# Patient Record
Sex: Female | Born: 1990 | Race: White | Hispanic: No | Marital: Married | Smoking: Never smoker
Health system: Southern US, Community
[De-identification: ages and names within clinical notes are randomized; demographics above are authoritative.]

## PROBLEM LIST (undated history)

## (undated) DIAGNOSIS — O009 Unspecified ectopic pregnancy without intrauterine pregnancy: Secondary | ICD-10-CM

## (undated) DIAGNOSIS — G43909 Migraine, unspecified, not intractable, without status migrainosus: Secondary | ICD-10-CM

## (undated) DIAGNOSIS — F32A Depression, unspecified: Secondary | ICD-10-CM

## (undated) DIAGNOSIS — E162 Hypoglycemia, unspecified: Secondary | ICD-10-CM

## (undated) DIAGNOSIS — F329 Major depressive disorder, single episode, unspecified: Secondary | ICD-10-CM

## (undated) DIAGNOSIS — O365930 Maternal care for other known or suspected poor fetal growth, third trimester, not applicable or unspecified: Secondary | ICD-10-CM

## (undated) DIAGNOSIS — Z3A40 40 weeks gestation of pregnancy: Secondary | ICD-10-CM

## (undated) DIAGNOSIS — F419 Anxiety disorder, unspecified: Secondary | ICD-10-CM

## (undated) DIAGNOSIS — F319 Bipolar disorder, unspecified: Secondary | ICD-10-CM

## (undated) HISTORY — DX: Hypoglycemia, unspecified: E16.2

## (undated) HISTORY — DX: Maternal care for other known or suspected poor fetal growth, third trimester, not applicable or unspecified: O36.5930

## (undated) HISTORY — DX: 40 weeks gestation of pregnancy: Z3A.40

## (undated) HISTORY — DX: Unspecified ectopic pregnancy without intrauterine pregnancy: O00.90

## (undated) HISTORY — PX: WISDOM TOOTH EXTRACTION: SHX21

## (undated) HISTORY — PX: OTHER SURGICAL HISTORY: SHX169

## (undated) HISTORY — DX: Migraine, unspecified, not intractable, without status migrainosus: G43.909

## (undated) HISTORY — PX: TONSILLECTOMY: SUR1361

---
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
## 2012-07-03 ENCOUNTER — Encounter: Payer: Self-pay | Admitting: Family Medicine

## 2012-07-03 ENCOUNTER — Ambulatory Visit (INDEPENDENT_AMBULATORY_CARE_PROVIDER_SITE_OTHER): Payer: 59 | Admitting: Family Medicine

## 2012-07-03 VITALS — BP 131/101 | HR 85 | Ht 69.75 in | Wt 230.0 lb

## 2012-07-03 DIAGNOSIS — Z1151 Encounter for screening for human papillomavirus (HPV): Secondary | ICD-10-CM

## 2012-07-03 DIAGNOSIS — IMO0001 Reserved for inherently not codable concepts without codable children: Secondary | ICD-10-CM | POA: Insufficient documentation

## 2012-07-03 DIAGNOSIS — Z309 Encounter for contraceptive management, unspecified: Secondary | ICD-10-CM

## 2012-07-03 DIAGNOSIS — Z124 Encounter for screening for malignant neoplasm of cervix: Secondary | ICD-10-CM

## 2012-07-03 DIAGNOSIS — Z01419 Encounter for gynecological examination (general) (routine) without abnormal findings: Secondary | ICD-10-CM

## 2012-07-03 DIAGNOSIS — Z113 Encounter for screening for infections with a predominantly sexual mode of transmission: Secondary | ICD-10-CM

## 2012-07-03 DIAGNOSIS — G43109 Migraine with aura, not intractable, without status migrainosus: Secondary | ICD-10-CM

## 2012-07-03 MED ORDER — MISOPROSTOL 200 MCG PO TABS: 200.0000 ug | ORAL_TABLET | Freq: Two times a day (BID) | ORAL | Status: DC

## 2012-07-03 NOTE — Progress Notes (Signed)
Patient ID: Emily Payne, female   DOB: 1991-02-15, 21 y.o.   MRN: [id number] New patient here for yearly exam and needs to discuss birth control.

## 2012-07-03 NOTE — Progress Notes (Signed)
  Subjective:     Emily Payne is a 21 y.o. female and is here for a comprehensive physical exam. The patient reports problems - migraine headache with aura and anxiety, including panic attacks.  Also, wants to change her birth control, has been on Nuvaring.Marland Kitchen  History   Social History  . Marital Status: Married    Spouse Name: N/A    Number of Children: N/A  . Years of Education: N/A   Occupational History  . Not on file.   Social History Main Topics  . Smoking status: Never Smoker   . Smokeless tobacco: Never Used  . Alcohol Use: No  . Drug Use: No  . Sexually Active: Yes -- Female partner(s)   Other Topics Concern  . Not on file   Social History Narrative  . No narrative on file   No health maintenance topics applied.  The following portions of the patient's history were reviewed and updated as appropriate: allergies, current medications, past family history, past medical history, past social history, past surgical history and problem list.  Review of Systems Pertinent items are noted in HPI.   Objective:    BP 131/101  Pulse 85  Ht 5' 9.75" (1.772 m)  Wt 230 lb (104.327 kg)  BMI 33.24 kg/m2  LMP 07/03/2012 General appearance: alert, cooperative and appears stated age Neck: no adenopathy, supple, symmetrical, trachea midline and thyroid not enlarged, symmetric, no tenderness/mass/nodules Lungs: clear to auscultation bilaterally Breasts: normal appearance, no masses or tenderness Heart: regular rate and rhythm, S1, S2 normal, no murmur, click, rub or gallop Abdomen: soft, non-tender; bowel sounds normal; no masses,  no organomegaly Pelvic: cervix normal in appearance, external genitalia normal, no adnexal masses or tenderness, no cervical motion tenderness, uterus normal size, shape, and consistency, vagina normal without discharge and uterus is retroverted Extremities: extremities normal, atraumatic, no cyanosis or edema Pulses: 2+ and symmetric Skin: Skin color,  texture, turgor normal. No rashes or lesions Lymph nodes: Cervical, supraclavicular, and axillary nodes normal. Neurologic: Grossly normal    Assessment:    Healthy female exam. Migraine with aura, anxiety/panic attacks. Desire to change contraception to LARC.     Plan:  Return for skyla--after cytotec. Pap smear today To see Jannifer Rodney, NP tomorrow to help with anxiety and headache.   See After Visit Summary for Counseling Recommendations

## 2012-07-03 NOTE — Patient Instructions (Addendum)
Anxiety and Panic Attacks Your caregiver has informed you that you are having an anxiety or panic attack. There may be many forms of this. Most of the time these attacks come suddenly and without warning. They come at any time of day, including periods of sleep, and at any time of life. They may be strong and unexplained. Although panic attacks are very scary, they are physically harmless. Sometimes the cause of your anxiety is not known. Anxiety is a protective mechanism of the body in its fight or flight mechanism. Most of these perceived danger situations are actually nonphysical situations (such as anxiety over losing a job). CAUSES  The causes of an anxiety or panic attack are many. Panic attacks may occur in otherwise healthy people given a certain set of circumstances. There may be a genetic cause for panic attacks. Some medications may also have anxiety as a side effect. SYMPTOMS  Some of the most common feelings are:  Intense terror.  Dizziness, feeling faint.  Hot and cold flashes.  Fear of going crazy.  Feelings that nothing is real.  Sweating.  Shaking.  Chest pain or a fast heartbeat (palpitations).  Smothering, choking sensations.  Feelings of impending doom and that death is near.  Tingling of extremities, this may be from over-breathing.  Altered reality (derealization).  Being detached from yourself (depersonalization). Several symptoms can be present to make up anxiety or panic attacks. DIAGNOSIS  The evaluation by your caregiver will depend on the type of symptoms you are experiencing. The diagnosis of anxiety or panic attack is made when no physical illness can be determined to be a cause of the symptoms. TREATMENT  Treatment to prevent anxiety and panic attacks may include:  Avoidance of circumstances that cause anxiety.  Reassurance and relaxation.  Regular exercise.  Relaxation therapies, such as yoga.  Psychotherapy with a psychiatrist or  therapist.  Avoidance of caffeine, alcohol and illegal drugs.  Prescribed medication. SEEK IMMEDIATE MEDICAL CARE IF:   You experience panic attack symptoms that are different than your usual symptoms.  You have any worsening or concerning symptoms. Document Released: 09/06/2005 Document Revised: 11/29/2011 Document Reviewed: 01/08/2010 [facility] Patient Information 2013 [place], [place]. Preventive Care for Adults, Female A healthy lifestyle and preventive care can promote health and wellness. Preventive health guidelines for women include the following key practices.  A routine yearly physical is a good way to check with your caregiver about your health and preventive screening. It is a chance to share any concerns and updates on your health, and to receive a thorough exam.  Visit your dentist for a routine exam and preventive care every 6 months. Brush your teeth twice a day and floss once a day. Good oral hygiene prevents tooth decay and gum disease.  The frequency of eye exams is based on your age, health, family medical history, use of contact lenses, and other factors. Follow your caregiver's recommendations for frequency of eye exams.  Eat a healthy diet. Foods like vegetables, fruits, whole grains, low-fat dairy products, and lean protein foods contain the nutrients you need without too many calories. Decrease your intake of foods high in solid fats, added sugars, and salt. Eat the right amount of calories for you.Get information about a proper diet from your caregiver, if necessary.  Regular physical exercise is one of the most important things you can do for your health. Most adults should get at least 150 minutes of moderate-intensity exercise (any activity that increases your heart rate and causes  you to sweat) each week. In addition, most adults need muscle-strengthening exercises on 2 or more days a week.  Maintain a healthy weight. The body mass index (BMI) is a screening tool  to identify possible weight problems. It provides an estimate of body fat based on height and weight. Your caregiver can help determine your BMI, and can help you achieve or maintain a healthy weight.For adults 20 years and older:  A BMI below 18.5 is considered underweight.  A BMI of 18.5 to 24.9 is normal.  A BMI of 25 to 29.9 is considered overweight.  A BMI of 30 and above is considered obese.  Maintain normal blood lipids and cholesterol levels by exercising and minimizing your intake of saturated fat. Eat a balanced diet with plenty of fruit and vegetables. Blood tests for lipids and cholesterol should begin at age 55 and be repeated every 5 years. If your lipid or cholesterol levels are high, you are over 50, or you are at high risk for heart disease, you may need your cholesterol levels checked more frequently.Ongoing high lipid and cholesterol levels should be treated with medicines if diet and exercise are not effective.  If you smoke, find out from your caregiver how to quit. If you do not use tobacco, do not start.  If you are pregnant, do not drink alcohol. If you are breastfeeding, be very cautious about drinking alcohol. If you are not pregnant and choose to drink alcohol, do not exceed 1 drink per day. One drink is considered to be 12 ounces (355 mL) of beer, 5 ounces (148 mL) of wine, or 1.5 ounces (44 mL) of liquor.  Avoid use of street drugs. Do not share needles with anyone. Ask for help if you need support or instructions about stopping the use of drugs.  High blood pressure causes heart disease and increases the risk of stroke. Your blood pressure should be checked at least every 1 to 2 years. Ongoing high blood pressure should be treated with medicines if weight loss and exercise are not effective.  If you are 10 to 21 years old, ask your caregiver if you should take aspirin to prevent strokes.  Diabetes screening involves taking a blood sample to check your fasting  blood sugar level. This should be done once every 3 years, after age 59, if you are within normal weight and without risk factors for diabetes. Testing should be considered at a younger age or be carried out more frequently if you are overweight and have at least 1 risk factor for diabetes.  Breast cancer screening is essential preventive care for women. You should practice "breast self-awareness." This means understanding the normal appearance and feel of your breasts and may include breast self-examination. Any changes detected, no matter how small, should be reported to a caregiver. Women in their 9s and 30s should have a clinical breast exam (CBE) by a caregiver as part of a regular health exam every 1 to 3 years. After age 59, women should have a CBE every year. Starting at age 56, women should consider having a mammography (breast X-ray test) every year. Women who have a family history of breast cancer should talk to their caregiver about genetic screening. Women at a high risk of breast cancer should talk to their caregivers about having magnetic resonance imaging (MRI) and a mammography every year.  The Pap test is a screening test for cervical cancer. A Pap test can show cell changes on the cervix that might  become cervical cancer if left untreated. A Pap test is a procedure in which cells are obtained and examined from the lower end of the uterus (cervix).  Women should have a Pap test starting at age 59.  Between ages 70 and 66, Pap tests should be repeated every 2 years.  Beginning at age 61, you should have a Pap test every 3 years as long as the past 3 Pap tests have been normal.  Some women have medical problems that increase the chance of getting cervical cancer. Talk to your caregiver about these problems. It is especially important to talk to your caregiver if a new problem develops soon after your last Pap test. In these cases, your caregiver may recommend more frequent screening and  Pap tests.  The above recommendations are the same for women who have or have not gotten the vaccine for human papillomavirus (HPV).  If you had a hysterectomy for a problem that was not cancer or a condition that could lead to cancer, then you no longer need Pap tests. Even if you no longer need a Pap test, a regular exam is a good idea to make sure no other problems are starting.  If you are between ages 51 and 41, and you have had normal Pap tests going back 10 years, you no longer need Pap tests. Even if you no longer need a Pap test, a regular exam is a good idea to make sure no other problems are starting.  If you have had past treatment for cervical cancer or a condition that could lead to cancer, you need Pap tests and screening for cancer for at least 20 years after your treatment.  If Pap tests have been discontinued, risk factors (such as a new sexual partner) need to be reassessed to determine if screening should be resumed.  The HPV test is an additional test that may be used for cervical cancer screening. The HPV test looks for the virus that can cause the cell changes on the cervix. The cells collected during the Pap test can be tested for HPV. The HPV test could be used to screen women aged 75 years and older, and should be used in women of any age who have unclear Pap test results. After the age of 32, women should have HPV testing at the same frequency as a Pap test.  Colorectal cancer can be detected and often prevented. Most routine colorectal cancer screening begins at the age of 66 and continues through age 45. However, your caregiver may recommend screening at an earlier age if you have risk factors for colon cancer. On a yearly basis, your caregiver may provide home test kits to check for hidden blood in the stool. Use of a small camera at the end of a tube, to directly examine the colon (sigmoidoscopy or colonoscopy), can detect the earliest forms of colorectal cancer. Talk to  your caregiver about this at age 36, when routine screening begins. Direct examination of the colon should be repeated every 5 to 10 years through age 2, unless early forms of pre-cancerous polyps or small growths are found.  Hepatitis C blood testing is recommended for all people born from [birth year] through 1965 and any individual with known risks for hepatitis C.  Practice safe sex. Use condoms and avoid high-risk sexual practices to reduce the spread of sexually transmitted infections (STIs). STIs include gonorrhea, chlamydia, syphilis, trichomonas, herpes, HPV, and human immunodeficiency virus (HIV). Herpes, HIV, and HPV are viral illnesses  that have no cure. They can result in disability, cancer, and death. Sexually active women aged 49 and younger should be checked for chlamydia. Older women with new or multiple partners should also be tested for chlamydia. Testing for other STIs is recommended if you are sexually active and at increased risk.  Osteoporosis is a disease in which the bones lose minerals and strength with aging. This can result in serious bone fractures. The risk of osteoporosis can be identified using a bone density scan. Women ages 13 and over and women at risk for fractures or osteoporosis should discuss screening with their caregivers. Ask your caregiver whether you should take a calcium supplement or vitamin D to reduce the rate of osteoporosis.  Menopause can be associated with physical symptoms and risks. Hormone replacement therapy is available to decrease symptoms and risks. You should talk to your caregiver about whether hormone replacement therapy is right for you.  Use sunscreen with sun protection factor (SPF) of 30 or more. Apply sunscreen liberally and repeatedly throughout the day. You should seek shade when your shadow is shorter than you. Protect yourself by wearing long sleeves, pants, a wide-brimmed hat, and sunglasses year round, whenever you are outdoors.  Once a  month, do a whole body skin exam, using a mirror to look at the skin on your back. Notify your caregiver of new moles, moles that have irregular borders, moles that are larger than a pencil eraser, or moles that have changed in shape or color.  Stay current with required immunizations.  Influenza. You need a dose every fall (or winter). The composition of the flu vaccine changes each year, so being vaccinated once is not enough.  Pneumococcal polysaccharide. You need 1 to 2 doses if you smoke cigarettes or if you have certain chronic medical conditions. You need 1 dose at age 74 (or older) if you have never been vaccinated.  Tetanus, diphtheria, pertussis (Tdap, Td). Get 1 dose of Tdap vaccine if you are younger than age 48, are over 37 and have contact with an infant, are a Research scientist (physical sciences), are pregnant, or simply want to be protected from whooping cough. After that, you need a Td booster dose every 10 years. Consult your caregiver if you have not had at least 3 tetanus and diphtheria-containing shots sometime in your life or have a deep or dirty wound.  HPV. You need this vaccine if you are a woman age 54 or younger. The vaccine is given in 3 doses over 6 months.  Measles, mumps, rubella (MMR). You need at least 1 dose of MMR if you were born in 1957 or later. You may also need a second dose.  Meningococcal. If you are age 51 to 52 and a first-year college student living in a residence hall, or have one of several medical conditions, you need to get vaccinated against meningococcal disease. You may also need additional booster doses.  Zoster (shingles). If you are age 52 or older, you should get this vaccine.  Varicella (chickenpox). If you have never had chickenpox or you were vaccinated but received only 1 dose, talk to your caregiver to find out if you need this vaccine.  Hepatitis A. You need this vaccine if you have a specific risk factor for hepatitis A virus infection or you simply  wish to be protected from this disease. The vaccine is usually given as 2 doses, 6 to 18 months apart.  Hepatitis B. You need this vaccine if you have a specific  risk factor for hepatitis B virus infection or you simply wish to be protected from this disease. The vaccine is given in 3 doses, usually over 6 months. Preventive Services / Frequency Ages 25 to 9  Blood pressure check.** / Every 1 to 2 years.  Lipid and cholesterol check.** / Every 5 years beginning at age 76.  Clinical breast exam.** / Every 3 years for women in their 55s and 30s.  Pap test.** / Every 2 years from ages 45 through 81. Every 3 years starting at age 32 through age 59 or 78 with a history of 3 consecutive normal Pap tests.  HPV screening.** / Every 3 years from ages 86 through ages 68 to 46 with a history of 3 consecutive normal Pap tests.  Hepatitis C blood test.** / For any individual with known risks for hepatitis C.  Skin self-exam. / Monthly.  Influenza immunization.** / Every year.  Pneumococcal polysaccharide immunization.** / 1 to 2 doses if you smoke cigarettes or if you have certain chronic medical conditions.  Tetanus, diphtheria, pertussis (Tdap, Td) immunization. / A one-time dose of Tdap vaccine. After that, you need a Td booster dose every 10 years.  HPV immunization. / 3 doses over 6 months, if you are 85 and younger.  Measles, mumps, rubella (MMR) immunization. / You need at least 1 dose of MMR if you were born in 1957 or later. You may also need a second dose.  Meningococcal immunization. / 1 dose if you are age 49 to 59 and a first-year college student living in a residence hall, or have one of several medical conditions, you need to get vaccinated against meningococcal disease. You may also need additional booster doses.  Varicella immunization.** / Consult your caregiver.  Hepatitis A immunization.** / Consult your caregiver. 2 doses, 6 to 18 months apart.  Hepatitis B  immunization.** / Consult your caregiver. 3 doses usually over 6 months. Ages 21 to 16  Blood pressure check.** / Every 1 to 2 years.  Lipid and cholesterol check.** / Every 5 years beginning at age 43.  Clinical breast exam.** / Every year after age 45.  Mammogram.** / Every year beginning at age 72 and continuing for as long as you are in good health. Consult with your caregiver.  Pap test.** / Every 3 years starting at age 79 through age 69 or 19 with a history of 3 consecutive normal Pap tests.  HPV screening.** / Every 3 years from ages 63 through ages 38 to 37 with a history of 3 consecutive normal Pap tests.  Fecal occult blood test (FOBT) of stool. / Every year beginning at age 22 and continuing until age 50. You may not need to do this test if you get a colonoscopy every 10 years.  Flexible sigmoidoscopy or colonoscopy.** / Every 5 years for a flexible sigmoidoscopy or every 10 years for a colonoscopy beginning at age 56 and continuing until age 40.  Hepatitis C blood test.** / For all people born from [birth year] through 1965 and any individual with known risks for hepatitis C.  Skin self-exam. / Monthly.  Influenza immunization.** / Every year.  Pneumococcal polysaccharide immunization.** / 1 to 2 doses if you smoke cigarettes or if you have certain chronic medical conditions.  Tetanus, diphtheria, pertussis (Tdap, Td) immunization.** / A one-time dose of Tdap vaccine. After that, you need a Td booster dose every 10 years.  Measles, mumps, rubella (MMR) immunization. / You need at least 1 dose of MMR  if you were born in 1957 or later. You may also need a second dose.  Varicella immunization.** / Consult your caregiver.  Meningococcal immunization.** / Consult your caregiver.  Hepatitis A immunization.** / Consult your caregiver. 2 doses, 6 to 18 months apart.  Hepatitis B immunization.** / Consult your caregiver. 3 doses, usually over 6 months. Ages 7 and over  Blood  pressure check.** / Every 1 to 2 years.  Lipid and cholesterol check.** / Every 5 years beginning at age 7.  Clinical breast exam.** / Every year after age 15.  Mammogram.** / Every year beginning at age 87 and continuing for as long as you are in good health. Consult with your caregiver.  Pap test.** / Every 3 years starting at age 28 through age 45 or 22 with a 3 consecutive normal Pap tests. Testing can be stopped between 65 and 70 with 3 consecutive normal Pap tests and no abnormal Pap or HPV tests in the past 10 years.  HPV screening.** / Every 3 years from ages 25 through ages 70 or 79 with a history of 3 consecutive normal Pap tests. Testing can be stopped between 65 and 70 with 3 consecutive normal Pap tests and no abnormal Pap or HPV tests in the past 10 years.  Fecal occult blood test (FOBT) of stool. / Every year beginning at age 44 and continuing until age 62. You may not need to do this test if you get a colonoscopy every 10 years.  Flexible sigmoidoscopy or colonoscopy.** / Every 5 years for a flexible sigmoidoscopy or every 10 years for a colonoscopy beginning at age 71 and continuing until age 21.  Hepatitis C blood test.** / For all people born from [birth year] through 1965 and any individual with known risks for hepatitis C.  Osteoporosis screening.** / A one-time screening for women ages 34 and over and women at risk for fractures or osteoporosis.  Skin self-exam. / Monthly.  Influenza immunization.** / Every year.  Pneumococcal polysaccharide immunization.** / 1 dose at age 34 (or older) if you have never been vaccinated.  Tetanus, diphtheria, pertussis (Tdap, Td) immunization. / A one-time dose of Tdap vaccine if you are over 65 and have contact with an infant, are a Research scientist (physical sciences), or simply want to be protected from whooping cough. After that, you need a Td booster dose every 10 years.  Varicella immunization.** / Consult your caregiver.  Meningococcal  immunization.** / Consult your caregiver.  Hepatitis A immunization.** / Consult your caregiver. 2 doses, 6 to 18 months apart.  Hepatitis B immunization.** / Check with your caregiver. 3 doses, usually over 6 months. ** Family history and personal history of risk and conditions may change your caregiver's recommendations. Document Released: 11/02/2001 Document Revised: 11/29/2011 Document Reviewed: 02/01/2011 [facility] Patient Information 2013 [place], [place].  Levonorgestrel intrauterine device (IUD) What is this medicine? LEVONORGESTREL IUD (LEE voe nor jes trel) is a contraceptive (birth control) device. It is used to prevent pregnancy and to treat heavy bleeding that occurs during your period. It can be used for up to 5 years. This medicine may be used for other purposes; ask your health care provider or pharmacist if you have questions. What should I tell my health care provider before I take this medicine? They need to know if you have any of these conditions: -abnormal Pap smear -cancer of the breast, uterus, or cervix -diabetes -endometritis -genital or pelvic infection now or in the past -have more than one sexual partner or your  partner has more than one partner -heart disease -history of an ectopic or tubal pregnancy -immune system problems -IUD in place -liver disease or tumor -problems with blood clots or take blood-thinners -use intravenous drugs -uterus of unusual shape -vaginal bleeding that has not been explained -an unusual or allergic reaction to levonorgestrel, other hormones, silicone, or polyethylene, medicines, foods, dyes, or preservatives -pregnant or trying to get pregnant -breast-feeding How should I use this medicine? This device is placed inside the uterus by a health care professional. Talk to your pediatrician regarding the use of this medicine in children. Special care may be needed. Overdosage: If you think you have taken too much of this medicine  contact a poison control center or emergency room at once. NOTE: This medicine is only for you. Do not share this medicine with others. What if I miss a dose? This does not apply. What may interact with this medicine? Do not take this medicine with any of the following medications: -amprenavir -bosentan -fosamprenavir This medicine may also interact with the following medications: -aprepitant -barbiturate medicines for inducing sleep or treating seizures -bexarotene -griseofulvin -medicines to treat seizures like carbamazepine, ethotoin, felbamate, oxcarbazepine, phenytoin, topiramate -modafinil -pioglitazone -rifabutin -rifampin -rifapentine -some medicines to treat HIV infection like atazanavir, indinavir, lopinavir, nelfinavir, tipranavir, ritonavir -St. John's wort -warfarin This list may not describe all possible interactions. Give your health care provider a list of all the medicines, herbs, non-prescription drugs, or dietary supplements you use. Also tell them if you smoke, drink alcohol, or use illegal drugs. Some items may interact with your medicine. What should I watch for while using this medicine? Visit your doctor or health care professional for regular check ups. See your doctor if you or your partner has sexual contact with others, becomes HIV positive, or gets a sexual transmitted disease. This product does not protect you against HIV infection (AIDS) or other sexually transmitted diseases. You can check the placement of the IUD yourself by reaching up to the top of your vagina with clean fingers to feel the threads. Do not pull on the threads. It is a good habit to check placement after each menstrual period. Call your doctor right away if you feel more of the IUD than just the threads or if you cannot feel the threads at all. The IUD may come out by itself. You may become pregnant if the device comes out. If you notice that the IUD has come out use a backup birth  control method like condoms and call your health care provider. Using tampons will not change the position of the IUD and are okay to use during your period. What side effects may I notice from receiving this medicine? Side effects that you should report to your doctor or health care professional as soon as possible: -allergic reactions like skin rash, itching or hives, swelling of the face, lips, or tongue -fever, flu-like symptoms -genital sores -high blood pressure -no menstrual period for 6 weeks during use -pain, swelling, warmth in the leg -pelvic pain or tenderness -severe or sudden headache -signs of pregnancy -stomach cramping -sudden shortness of breath -trouble with balance, talking, or walking -unusual vaginal bleeding, discharge -yellowing of the eyes or skin Side effects that usually do not require medical attention (report to your doctor or health care professional if they continue or are bothersome): -acne -breast pain -change in sex drive or performance -changes in weight -cramping, dizziness, or faintness while the device is being inserted -headache -irregular menstrual bleeding  within first 3 to 6 months of use -nausea This list may not describe all possible side effects. Call your doctor for medical advice about side effects. You may report side effects to FDA at 1-800-FDA-1088. Where should I keep my medicine? This does not apply. NOTE: This sheet is a summary. It may not cover all possible information. If you have questions about this medicine, talk to your doctor, pharmacist, or health care provider.  2012, Elsevier/Gold Standard. (09/27/2008 6:39:08 PM)

## 2012-07-04 ENCOUNTER — Ambulatory Visit (INDEPENDENT_AMBULATORY_CARE_PROVIDER_SITE_OTHER): Payer: 59 | Admitting: Nurse Practitioner

## 2012-07-04 ENCOUNTER — Encounter: Payer: Self-pay | Admitting: Nurse Practitioner

## 2012-07-04 ENCOUNTER — Encounter: Payer: Self-pay | Admitting: Family Medicine

## 2012-07-04 VITALS — BP 129/92 | HR 100 | Ht 69.75 in | Wt 230.0 lb

## 2012-07-04 DIAGNOSIS — E669 Obesity, unspecified: Secondary | ICD-10-CM

## 2012-07-04 DIAGNOSIS — G43119 Migraine with aura, intractable, without status migrainosus: Secondary | ICD-10-CM

## 2012-07-04 MED ORDER — TOPIRAMATE 25 MG PO TABS: 25.0000 mg | ORAL_TABLET | Freq: Three times a day (TID) | ORAL | Status: DC

## 2012-07-04 MED ORDER — PROMETHAZINE HCL 25 MG PO TABS: 25.0000 mg | ORAL_TABLET | Freq: Four times a day (QID) | ORAL | Status: DC | PRN

## 2012-07-04 MED ORDER — RIZATRIPTAN BENZOATE 10 MG PO TABS: 10.0000 mg | ORAL_TABLET | ORAL | Status: DC | PRN

## 2012-07-04 NOTE — Progress Notes (Signed)
Diagnosis: Migraine with Aura  History: She has had migraine with aura since she was 21 yo. She has a strong family history including her mother. She was told one that she may be Bipolar but that diagnosis was made by her regular MD. He had her on Depakote and Lamictal for several years and her headaches did improve.  She does have a history of anxiety, panic attacks, and sleep issues. She falls asleep well, but wakes up every 2 hours throughout the night. 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 She will be getting married in April and hopes to have better control of her migraines before then.     Location: Occipital, Right or left temple  Number of Headache days/month: Severe:3 Moderate:3 Mild:daily  Current Outpatient Prescriptions on File Prior to Visit  Medication Sig Dispense Refill  . etonogestrel-ethinyl estradiol (NUVARING) 0.12-0.015 MG/24HR vaginal ring Place 1 each vaginally every 28 (twenty-eight) days. Insert vaginally and leave in place for 3 consecutive weeks, then remove for 1 week.      . misoprostol (CYTOTEC) 200 MCG tablet Take 1 tablet (200 mcg total) by mouth 2 (two) times daily. Take 400 mcg the night prior to procedure,  400 mcg the morning of.  4 tablet  0  . promethazine (PHENERGAN) 25 MG tablet Take 1 tablet (25 mg total) by mouth every 6 (six) hours as needed for nausea.  30 tablet  1  . rizatriptan (MAXALT) 10 MG tablet Take 1 tablet (10 mg total) by mouth as needed for migraine. May repeat in 2 hours if needed  12 tablet  11  . topiramate (TOPAMAX) 25 MG tablet Take 1 tablet (25 mg total) by mouth 3 (three) times daily. Take one tab at bedtime for one week, then 2 tabs at bedtime for one week, then 3 tabs at bedtime till seen again  90 tablet  3    Acute prevention: None. Takes BC of she gets a migraine  Past Medical History  Diagnosis Date  . Hypoglycemic syndrome   . Migraines    Past Surgical History  Procedure Date    . Tonsillectomy   . Wisdom tooth extraction   . Abscess tooth    Family History  Problem Relation Age of Onset  . Ovarian cysts Mother   . Stroke Maternal Grandmother   . Cancer Maternal Grandmother 67    uterine cancer   Social History:  reports that she has never smoked. She has never used smokeless tobacco. She reports that she does not drink alcohol or use illicit drugs. Allergies:  Allergies  Allergen Reactions  . Sulfa Drugs Cross Reactors Anaphylaxis, Itching and Other (See Comments)    Flu like symptoms    Triggers: Stress  Birth control: Nuvaring. Will be getting Skyla next Tuesday  ROS: Positive for anxiety, migraine, weight gain. Negative for chest pain, GI problems, skin issues  Exam: Well developed, well nourished 21 YO Caucasian female  General: NAD, anxious but does settle down HEENT: Negative Cardiac: RRR Lungs:Clear Neuro:Negative Skin:Negative  Impression:migraine - classic  Plan: we discussed the pathophysiology of migraine. She was made aware by Dr Shawnie Pons that she can not use estrogen containing birth control and will be having a Skyla inserted next week. She will come off the Nuvaring at that time. We discussed prevention and she has opted to take Topamax. She will also be given Maxalt and phenergan for when she gets a migraine. She will RTC 6 weeks  or sooner  Time Spent: 45 min

## 2012-07-04 NOTE — Patient Instructions (Signed)
Migraine Headache A migraine headache is an intense, throbbing pain on one or both sides of your head. A migraine can last for 30 minutes to several hours. CAUSES  The exact cause of a migraine headache is not always known. However, a migraine may be caused when nerves in the brain become irritated and release chemicals that cause inflammation. This causes pain. SYMPTOMS  Pain on one or both sides of your head.  Pulsating or throbbing pain.  Severe pain that prevents daily activities.  Pain that is aggravated by any physical activity.  Nausea, vomiting, or both.  Dizziness.  Pain with exposure to bright lights, loud noises, or activity.  General sensitivity to bright lights, loud noises, or smells. Before you get a migraine, you may get warning signs that a migraine is coming (aura). An aura may include:  Seeing flashing lights.  Seeing bright spots, halos, or zig-zag lines.  Having tunnel vision or blurred vision.  Having feelings of numbness or tingling.  Having trouble talking.  Having muscle weakness. MIGRAINE TRIGGERS  Alcohol.  Smoking.  Stress.  Menstruation.  Aged cheeses.  Foods or drinks that contain nitrates, glutamate, aspartame, or tyramine.  Lack of sleep.  Chocolate.  Caffeine.  Hunger.  Physical exertion.  Fatigue.  Medicines used to treat chest pain (nitroglycerine), birth control pills, estrogen, and some blood pressure medicines. DIAGNOSIS  A migraine headache is often diagnosed based on:  Symptoms.  Physical examination.  A CT scan or MRI of your head. TREATMENT Medicines may be given for pain and nausea. Medicines can also be given to help prevent recurrent migraines.  HOME CARE INSTRUCTIONS  Only take over-the-counter or prescription medicines for pain or discomfort as directed by your caregiver. The use of long-term narcotics is not recommended.  Lie down in a dark, quiet room when you have a migraine.  Keep a journal  to find out what may trigger your migraine headaches. For example, write down:  What you eat and drink.  How much sleep you get.  Any change to your diet or medicines.  Limit alcohol consumption.  Quit smoking if you smoke.  Get 7 to 9 hours of sleep, or as recommended by your caregiver.  Limit stress.  Keep lights dim if bright lights bother you and make your migraines worse. SEEK IMMEDIATE MEDICAL CARE IF:   Your migraine becomes severe.  You have a fever.  You have a stiff neck.  You have vision loss.  You have muscular weakness or loss of muscle control.  You start losing your balance or have trouble walking.  You feel faint or pass out.  You have severe symptoms that are different from your first symptoms. MAKE SURE YOU:   Understand these instructions.  Will watch your condition.  Will get help right away if you are not doing well or get worse. Document Released: 09/06/2005 Document Revised: 11/29/2011 Document Reviewed: 08/27/2011 Exit[employer] Patient Information 2013 [employer].  

## 2012-07-11 ENCOUNTER — Encounter: Payer: Self-pay | Admitting: Obstetrics & Gynecology

## 2012-07-11 ENCOUNTER — Ambulatory Visit (INDEPENDENT_AMBULATORY_CARE_PROVIDER_SITE_OTHER): Payer: 59 | Admitting: Obstetrics & Gynecology

## 2012-07-11 VITALS — BP 120/71 | HR 89 | Ht 69.0 in | Wt 230.0 lb

## 2012-07-11 DIAGNOSIS — Z975 Presence of (intrauterine) contraceptive device: Secondary | ICD-10-CM

## 2012-07-11 DIAGNOSIS — Z30430 Encounter for insertion of intrauterine contraceptive device: Secondary | ICD-10-CM

## 2012-07-11 DIAGNOSIS — Z01812 Encounter for preprocedural laboratory examination: Secondary | ICD-10-CM

## 2012-07-11 NOTE — Progress Notes (Signed)
  Subjective:    Patient ID: Emily Payne, female    DOB: 1991-08-18, 21 y.o.   MRN: [id number]  HPI  21 yo engaged W G0 who is here for [facility] insertion. She took cytotec last night and IBU this morning. She is considering a pregnancy in about 3 years.  Review of Systems     Objective:   Physical Exam  UPT negative, consent signed, time out done Cervix prepped with betadine and the posterior lip was grasped with a single tooth tenaculum. The Christean Grief was easily placed and the strings were cut to 3-4 cm. She tolerated the procedure well.      Assessment & Plan:   Contraception- Sklya RTC 1 month for string check

## 2012-08-15 ENCOUNTER — Ambulatory Visit (INDEPENDENT_AMBULATORY_CARE_PROVIDER_SITE_OTHER): Payer: 59 | Admitting: Nurse Practitioner

## 2012-08-15 VITALS — BP 123/86 | HR 85 | Ht 69.0 in | Wt 233.0 lb

## 2012-08-15 DIAGNOSIS — G43109 Migraine with aura, not intractable, without status migrainosus: Secondary | ICD-10-CM

## 2012-08-15 DIAGNOSIS — Z975 Presence of (intrauterine) contraceptive device: Secondary | ICD-10-CM

## 2012-08-15 NOTE — Progress Notes (Signed)
Here today for headache follow-up and to have her IUD strings checked.

## 2012-08-19 ENCOUNTER — Other Ambulatory Visit: Payer: Self-pay | Admitting: Nurse Practitioner

## 2013-04-13 ENCOUNTER — Emergency Department (HOSPITAL_COMMUNITY)
Admission: EM | Admit: 2013-04-13 | Discharge: 2013-04-13 | Disposition: A | Discharge status: Home / Self Care | Payer: 59 | Attending: Emergency Medicine | Admitting: Emergency Medicine

## 2013-04-13 ENCOUNTER — Encounter (HOSPITAL_COMMUNITY): Payer: Self-pay | Admitting: *Deleted

## 2013-04-13 ENCOUNTER — Emergency Department (HOSPITAL_COMMUNITY): Payer: 59

## 2013-04-13 DIAGNOSIS — W108XXA Fall (on) (from) other stairs and steps, initial encounter: Secondary | ICD-10-CM | POA: Insufficient documentation

## 2013-04-13 DIAGNOSIS — G43909 Migraine, unspecified, not intractable, without status migrainosus: Secondary | ICD-10-CM | POA: Insufficient documentation

## 2013-04-13 DIAGNOSIS — Z8639 Personal history of other endocrine, nutritional and metabolic disease: Secondary | ICD-10-CM | POA: Insufficient documentation

## 2013-04-13 DIAGNOSIS — Y9389 Activity, other specified: Secondary | ICD-10-CM | POA: Insufficient documentation

## 2013-04-13 DIAGNOSIS — S93409A Sprain of unspecified ligament of unspecified ankle, initial encounter: Secondary | ICD-10-CM | POA: Insufficient documentation

## 2013-04-13 DIAGNOSIS — Y92009 Unspecified place in unspecified non-institutional (private) residence as the place of occurrence of the external cause: Secondary | ICD-10-CM | POA: Insufficient documentation

## 2013-04-13 DIAGNOSIS — Z79899 Other long term (current) drug therapy: Secondary | ICD-10-CM | POA: Insufficient documentation

## 2013-04-13 DIAGNOSIS — Z862 Personal history of diseases of the blood and blood-forming organs and certain disorders involving the immune mechanism: Secondary | ICD-10-CM | POA: Insufficient documentation

## 2013-04-13 DIAGNOSIS — R209 Unspecified disturbances of skin sensation: Secondary | ICD-10-CM | POA: Insufficient documentation

## 2013-04-13 MED ORDER — PROMETHAZINE HCL 25 MG PO TABS: 25.0000 mg | ORAL_TABLET | Freq: Four times a day (QID) | ORAL | Status: DC | PRN

## 2013-04-13 MED ORDER — HYDROCODONE-ACETAMINOPHEN 5-325 MG PO TABS: 2.0000 | ORAL_TABLET | Freq: Four times a day (QID) | ORAL | Status: DC | PRN

## 2013-04-13 MED ORDER — ONDANSETRON 4 MG PO TBDP
8.0000 mg | ORAL_TABLET | Freq: Once | ORAL | Status: AC
  Administered 2013-04-13: 8 mg via ORAL

## 2013-04-13 MED ORDER — HYDROCODONE-ACETAMINOPHEN 5-325 MG PO TABS
2.0000 | ORAL_TABLET | Freq: Once | ORAL | Status: AC
  Administered 2013-04-13: 2 via ORAL
  Filled 2013-04-13: qty 2

## 2013-04-13 NOTE — ED Notes (Signed)
Reports falling down two steps pta and having right foot pain, swelling noted.

## 2013-04-13 NOTE — ED Provider Notes (Signed)
CSN: [id number]     Arrival date & time 04/13/13  1813 History  This chart was scribed for non-physician practitioner working with Suzi Roots, MD, by Ardelia Mems ED Scribe. This patient was seen in room TR08C/TR08C and the patient's care was started at 8:14 PM.   First MD Initiated Contact with Patient 04/13/13 1946     Chief Complaint  Patient presents with  . Foot Pain    The history is provided by the patient. No language interpreter was used.   HPI Comments: Emily Payne is a 22 y.o. female who presents to the Emergency Department complaining of constant, moderate right ankle and foot pain onset after an accidental fall 2 hours ago. She states that she fell down her porch stairs. She reports associated swelling and numbness to her right foot since the injury. She also complains of an abrasion to her left great toe that occurred at the same time. She denies hx of previous injury to the ankle. She states that she has not been able to walk on her ankle since the fall. Pt denies taking OTC medications at home to improve symptoms. She states that she did not drive to the ED. She denies fever, chills, nausea, vomiting or any other symptoms.    Past Medical History  Diagnosis Date  . Hypoglycemic syndrome   . Migraines    Past Surgical History  Procedure Laterality Date  . Tonsillectomy    . Wisdom tooth extraction    . Abscess tooth     Family History  Problem Relation Age of Onset  . Ovarian cysts Mother   . Stroke Maternal Grandmother   . Cancer Maternal Grandmother 27    uterine cancer   History  Substance Use Topics  . Smoking status: Never Smoker   . Smokeless tobacco: Never Used  . Alcohol Use: Yes     Comment: rare   OB History   Grav Para Term Preterm Abortions TAB SAB Ect Mult Living   0 0 0 0 0 0 0 0 0 0      Review of Systems  Constitutional: Negative for fever and chills.  Gastrointestinal: Negative for nausea and vomiting.  Musculoskeletal: Positive  for back pain.       Right foot and ankle pain.  Neurological: Positive for numbness.  All other systems reviewed and are negative.    Allergies  Sulfa drugs cross reactors  Home Medications   Current Outpatient Rx  Name  Route  Sig  Dispense  Refill  . levonorgestrel (MIRENA) 20 MCG/24HR IUD   Intrauterine   1 each by Intrauterine route once.         . rizatriptan (MAXALT) 10 MG tablet   Oral   Take 10 mg by mouth every 2 (two) hours as needed for migraine.         . topiramate (TOPAMAX) 25 MG tablet   Oral   Take 25 mg by mouth 3 (three) times daily. Take one tab at bedtime for one week, then 2 tabs at bedtime for one week, then 3 tabs at bedtime till seen again          Triage Vitals: BP 131/82  Pulse 93  Temp(Src) 98.5 F (36.9 C) (Oral)  Resp 18  SpO2 99%  Physical Exam  Nursing note and vitals reviewed. Constitutional: She is oriented to person, place, and time. She appears well-developed and well-nourished. No distress.  HENT:  Head: Normocephalic and atraumatic.  Right Ear: External ear  normal.  Left Ear: External ear normal.  Nose: Nose normal.  Mouth/Throat: Oropharynx is clear and moist.  Eyes: Conjunctivae are normal.  Neck: Normal range of motion.  Cardiovascular: Normal rate, regular rhythm and normal heart sounds.   Pulmonary/Chest: Effort normal and breath sounds normal. No stridor. No respiratory distress. She has no wheezes. She has no rales.  Abdominal: Soft. She exhibits no distension.  Musculoskeletal: Normal range of motion.  Tender to palpation on the lateral aspect of her right foot Swelling noted. Compartments soft.  Neurological: She is alert and oriented to person, place, and time. She has normal strength.  Neurovascularly intact.  Skin: Skin is warm and dry. She is not diaphoretic. No erythema.  Small abrasion on medial aspect of left great toe.  Psychiatric: She has a normal mood and affect. Her behavior is normal.    ED  Course   Procedures (including critical care time)  DIAGNOSTIC STUDIES: Oxygen Saturation is 99% on RA, normal by my interpretation.    COORDINATION OF CARE: 8:36 PM- Pt advised of plan for with medications in the ED and upon discharge and pt agrees. Pt also advised of plan for diagnostic radiology and she agrees.  Medications  HYDROcodone-acetaminophen (NORCO/VICODIN) 5-325 MG per tablet 2 tablet (not administered)  ondansetron (ZOFRAN-ODT) disintegrating tablet 8 mg (not administered)    Labs Reviewed - No data to display Dg Foot Complete Right  04/13/2013   *RADIOLOGY REPORT*  Clinical Data: Fall, right foot pain, lateral swelling  RIGHT FOOT COMPLETE - 3+ VIEW  Comparison: None.  Findings: No fracture or dislocation is seen.  The joint spaces are preserved.  Visualized soft tissues are grossly unremarkable.  IMPRESSION: No fracture or dislocation is seen.   Original Report Authenticated By: Charline Bills, M.D.   1. Ankle sprain and strain, right, initial encounter     MDM  Imaging shows no fracture. Directed pt to ice injury, take acetaminophen or ibuprofen for pain, and to elevate and rest the injury when possible. She was given crutches and an ASO brace for comfort. Neurovascularly intact. Compartment soft. Return instructions given. Vital signs stable for discharge. Follow up with PCP.     I personally performed the services described in this documentation, which was scribed in my presence. The recorded information has been reviewed and is accurate.     Mora Bellman, PA-C 04/13/13 2320

## 2013-04-13 NOTE — Progress Notes (Signed)
Orthopedic Tech Progress Note Patient Details:  Emily Payne 30-May-1991 [id number]  Ortho Devices Type of Ortho Device: ASO;Crutches Ortho Device/Splint Location: right ankle Ortho Device/Splint Interventions: Application   Brolin Dambrosia 04/13/2013, 9:03 PM

## 2013-04-17 NOTE — ED Provider Notes (Signed)
Medical screening examination/treatment/procedure(s) were performed by non-physician practitioner and as supervising physician I was immediately available for consultation/collaboration.   Rylyn Ranganathan E Flynn Lininger, MD 04/17/13 0835 

## 2013-05-30 ENCOUNTER — Encounter: Payer: Self-pay | Admitting: Nurse Practitioner

## 2013-05-30 NOTE — Progress Notes (Signed)
S: Follow up on IUD placement and migraines  P: IUD check  Migraine management

## 2013-06-22 ENCOUNTER — Encounter: Payer: Self-pay | Admitting: Family Medicine

## 2013-06-22 ENCOUNTER — Ambulatory Visit (INDEPENDENT_AMBULATORY_CARE_PROVIDER_SITE_OTHER)
Admission: RE | Admit: 2013-06-22 | Discharge: 2013-06-22 | Disposition: A | Discharge status: Home / Self Care | Payer: 59 | Source: Ambulatory Visit | Attending: Family Medicine | Admitting: Family Medicine

## 2013-06-22 ENCOUNTER — Ambulatory Visit (INDEPENDENT_AMBULATORY_CARE_PROVIDER_SITE_OTHER): Payer: 59 | Admitting: Family Medicine

## 2013-06-22 VITALS — BP 126/82 | HR 108 | Temp 99.2°F | Ht 69.0 in | Wt 236.5 lb

## 2013-06-22 DIAGNOSIS — F319 Bipolar disorder, unspecified: Secondary | ICD-10-CM | POA: Insufficient documentation

## 2013-06-22 DIAGNOSIS — Z23 Encounter for immunization: Secondary | ICD-10-CM

## 2013-06-22 DIAGNOSIS — M79609 Pain in unspecified limb: Secondary | ICD-10-CM

## 2013-06-22 DIAGNOSIS — M79671 Pain in right foot: Secondary | ICD-10-CM

## 2013-06-22 DIAGNOSIS — L218 Other seborrheic dermatitis: Secondary | ICD-10-CM

## 2013-06-22 DIAGNOSIS — G43109 Migraine with aura, not intractable, without status migrainosus: Secondary | ICD-10-CM

## 2013-06-22 DIAGNOSIS — L210 Seborrhea capitis: Secondary | ICD-10-CM

## 2013-06-22 MED ORDER — CICLOPIROX 1 % EX SHAM: MEDICATED_SHAMPOO | CUTANEOUS | Status: DC

## 2013-06-22 NOTE — Patient Instructions (Addendum)
It was so nice to meet you. I will call you with your xray results. Our office will call you with a psychiatry appointment.  Please make an appointment to see Dr. Patsy Lager.  Let me know how the dandruff shampoo works.

## 2013-06-22 NOTE — Progress Notes (Signed)
Subjective:    Patient ID: Emily Payne, female    DOB: Feb 04, 1991, 22 y.o.   MRN: [id number]  HPI  Very pleasant 22 yo female here to establish care.  Has IUD for contraception.  Not having periods since IUD. Engaged to be married, has two step children. Feels her fiance is very supportive.  Manic depression/bipolar disorder- diagnosed in her teens.  Was placed on Lamictal and Depakote but stopped taking it.  Felt they were not helpful.  Still has bouts of depression and mania.  At times, has stayed up all night spent a lot of money.  Currently does not feel "that depressed."  Denies any SI or HI.  Does feel more irritable.  Sleeping ok.  Appetite good.  Dandruff- has sulfa allergy and cannot use many dandruff shampoos.  Dandruff has been worse lately.  Very flaky and itchy.  Right foot injury- injured her right foot and ankle in 03/2013.  Went to ER.  Note reviewed.  Xray of foot neg and advised to wear a brace and crutches. No follow up with ortho was scheduled and she was weight bearing within 4 days.  Still hurts with walking and with touching of her lateral foot.  Has swelling of foot at end of the day.  Migraines- much improved now that she is taking topamax for prophylaxis.  Now only gets one or two a month.  Taking Maxalt for prophylaxis and they are often associated with nausea, vomiting and photophobia.  Patient Active Problem List   Diagnosis Date Noted  . Dandruff 06/22/2013  . Right foot pain 06/22/2013  . Bipolar disorder, unspecified 06/22/2013  . Obesity 07/04/2012  . Migraine with aura 07/03/2012  . Contraception 07/03/2012   Past Medical History  Diagnosis Date  . Hypoglycemic syndrome   . Migraines    Past Surgical History  Procedure Laterality Date  . Tonsillectomy    . Wisdom tooth extraction    . Abscess tooth     History  Substance Use Topics  . Smoking status: Never Smoker   . Smokeless tobacco: Never Used  . Alcohol Use: Yes     Comment: rare    Family History  Problem Relation Age of Onset  . Ovarian cysts Mother   . Stroke Maternal Grandmother   . Cancer Maternal Grandmother 27    uterine cancer   Allergies  Allergen Reactions  . Sulfa Drugs Cross Reactors Anaphylaxis, Itching and Other (See Comments)    Flu like symptoms   Current Outpatient Prescriptions on File Prior to Visit  Medication Sig Dispense Refill  . levonorgestrel (MIRENA) 20 MCG/24HR IUD 1 each by Intrauterine route once.      . rizatriptan (MAXALT) 10 MG tablet Take 10 mg by mouth every 2 (two) hours as needed for migraine.      . topiramate (TOPAMAX) 25 MG tablet Take 25 mg by mouth 3 (three) times daily. Take one tab at bedtime for one week, then 2 tabs at bedtime for one week, then 3 tabs at bedtime till seen again       No current facility-administered medications on file prior to visit.   The PMH, PSH, Social History, Family History, Medications, and allergies have been reviewed in [facility], and have been updated if relevant.   Review of Systems    See HPI No blurred vision No LE weakness No dysuria Objective:   Physical Exam  Constitutional: She appears well-developed and well-nourished. No distress.  HENT:  Head: Normocephalic.  Neck: Normal range of motion. Neck supple.  Cardiovascular: Normal rate.   Pulmonary/Chest: Effort normal and breath sounds normal.  Abdominal: Soft. Bowel sounds are normal.  Musculoskeletal:       Right ankle: She exhibits swelling. She exhibits normal range of motion, no deformity, no laceration and normal pulse. No lateral malleolus, no head of 5th metatarsal and no proximal fibula tenderness found.       Feet:  Skin: Skin is warm and dry.  Psychiatric: She has a normal mood and affect. Her speech is normal and behavior is normal. Judgment and thought content normal. Cognition and memory are normal.   BP 126/82  Pulse 108  Temp(Src) 99.2 F (37.3 C) (Oral)  Ht 5\' 9"  (1.753 m)  Wt 236 lb 8 oz (107.276  kg)  BMI 34.91 kg/m2  SpO2 96%        Assessment & Plan:  1. Migraine with aura On prophylaxis. Well controlled. Continue topamax and maxalt prn.   2. Dandruff Deteriorated.  Given sulfa allergy, will try ciclopirox shampoo. Call or return to clinic prn if these symptoms worsen or fail to improve as anticipated. The patient indicates understanding of these issues and agrees with the plan.   3. Right foot pain Concerning that she is TTP over lateral foot and she did not have proper recovery after injury. Will order xray today. Follow up with Dr. Patsy Lager.  - DG Foot Complete Right; Future  4. Bipolar disorder, unspecified Explained to Amneet that starting a medication and managing the disease process of bipolar disorder is outside my scope of practice and should be done by a psychiatrist. Referred to psychiatry. The patient indicates understanding of these issues and agrees with the plan.  - Ambulatory referral to Psychiatry

## 2013-07-05 ENCOUNTER — Encounter: Payer: Self-pay | Admitting: Family Medicine

## 2013-07-05 ENCOUNTER — Ambulatory Visit (INDEPENDENT_AMBULATORY_CARE_PROVIDER_SITE_OTHER): Payer: 59 | Admitting: Family Medicine

## 2013-07-05 VITALS — BP 110/80 | HR 71 | Temp 98.2°F | Ht 69.0 in | Wt 236.5 lb

## 2013-07-05 DIAGNOSIS — M25671 Stiffness of right ankle, not elsewhere classified: Secondary | ICD-10-CM

## 2013-07-05 DIAGNOSIS — M7671 Peroneal tendinitis, right leg: Secondary | ICD-10-CM

## 2013-07-05 DIAGNOSIS — M25673 Stiffness of unspecified ankle, not elsewhere classified: Secondary | ICD-10-CM

## 2013-07-05 DIAGNOSIS — M7750 Other enthesopathy of unspecified foot: Secondary | ICD-10-CM

## 2013-07-05 MED ORDER — OXAPROZIN 600 MG PO TABS: 1200.0000 mg | ORAL_TABLET | Freq: Every day | ORAL | Status: DC

## 2013-07-05 NOTE — Progress Notes (Signed)
Date:  07/05/2013   Name:  Emily Payne   DOB:  Feb 16, 1991   MRN:  [id number] Gender: female Age: 22 y.o.  Primary Physician: Ruthe Mannan, MD  Dear Dr. Dayton Martes,  Thank you for having me see Emily Payne in consultation today at [facility] at [facility] for her problem with R foot pain.  As you may recall, she is a 22 y.o. year old female with a history of injury to the R foot or ankle in July 2014, but she has had some persistant pain and swelling.   Larey Seat down porch stairs at the end of July and there was a lump on the lateral aspect of the foot. Went to [facility] and said that she sprained her foot and it was OK. Could not put weight on it - had an ASO brace. She reports that she used crutches for about one week afterwards.  She has had some continued lateral foot pain and pain with standing while she is at work and with walking around campus. No continued bruising. She is not in any rehabilitation, and she has lost a lot of range of motion in her ankle  Still is hurting and does not rotate as much. Working 30 hours a week at [employer].    Past Medical History  Diagnosis Date  . Hypoglycemic syndrome   . Migraines     Past Surgical History  Procedure Laterality Date  . Tonsillectomy    . Wisdom tooth extraction    . Abscess tooth      History   Social History  . Marital Status: Married    Spouse Name: N/A    Number of Children: N/A  . Years of Education: N/A   Social History Main Topics  . Smoking status: Never Smoker   . Smokeless tobacco: Never Used  . Alcohol Use: Yes     Comment: rare  . Drug Use: No  . Sexual Activity: Yes    Partners: Male    Birth Control/ Protection: IUD   Other Topics Concern  . None   Social History Narrative   G0- engaged.   Works at [employer] but also attends [employer].   Has two step children- ages 27 and 39.    Family History  Problem Relation Age of Onset  . Ovarian cysts Mother   . Stroke Maternal  Grandmother   . Cancer Maternal Grandmother 27    uterine cancer    Medications and Allergies reviewed  Outpatient Prescriptions Prior to Visit  Medication Sig Dispense Refill  . Ciclopirox 1 % shampoo Apply ~5 mL to wet hair; lather, and leave in place ~3 minutes; rinse. May use up to 10 mL for longer hair. Repeat twice weekly for 4 weeks; allow a minimum of 3 days between applications; if no improvement after 4 weeks of treatment, please call doctor  120 mL  0  . levonorgestrel (MIRENA) 20 MCG/24HR IUD 1 each by Intrauterine route once.      . rizatriptan (MAXALT) 10 MG tablet Take 10 mg by mouth every 2 (two) hours as needed for migraine.      . topiramate (TOPAMAX) 25 MG tablet Take 25 mg by mouth 3 (three) times daily. Take one tab at bedtime for one week, then 2 tabs at bedtime for one week, then 3 tabs at bedtime till seen again       No facility-administered medications prior to visit.    Review of Systems:    GEN:  No fevers, chills. Nontoxic. Primarily MSK c/o today. MSK: Detailed in the HPI GI: tolerating PO intake without difficulty Neuro: No numbness, parasthesias, or tingling associated. Otherwise the pertinent positives of the ROS are noted above.    Physical Examination: Filed Vitals:   07/05/13 1147  BP: 110/80  Pulse: 71  Temp: 98.2 F (36.8 C)  TempSrc: Oral  Height: 5\' 9"  (1.753 m)  Weight: 236 lb 8 oz (107.276 kg)      GEN: WDWN, NAD, Non-toxic, Alert & Oriented x 3 HEENT: Atraumatic, Normocephalic.  Ears and Nose: No external deformity. EXTR: No clubbing/cyanosis/edema NEURO: Normal gait.  PSYCH: Normally interactive. Conversant. Not depressed or anxious appearing.  Calm demeanor.   FEET: R Echymosis: no Edema: no ROM: Significant loss of motion, proximal mid 40% in each direction on the right foot compared to the contralateral side. Gait: heel toe, non-antalgic MT pain: no Callus pattern: none Lateral Mall: NT Medial Mall: NT Talus:  NT Navicular: NT Cuboid: NT Calcaneous: NT Metatarsals: NT 5th MT: NT Phalanges: NT Achilles: NT Plantar Fascia: NT Fat Pad: NT Peroneals: TTP ON R Post Tib: NT Great Toe: Nml motion Ant Drawer: neg ATFL: NT CFL: NT Deltoid: NT Other foot breakdown: none Long arch: preserved Transverse arch: preserved Hindfoot breakdown: none Sensation: intact   Eversion TTP  Dg Foot Complete Right  06/22/2013   CLINICAL DATA:  Right foot injury 2 months ago, persistent pain and swelling  EXAM: RIGHT FOOT COMPLETE - 3+ VIEW  COMPARISON:  04/13/2013  FINDINGS: There is no evidence of fracture or dislocation. There is no evidence of arthropathy or other focal bone abnormality. Soft tissues are unremarkable.  IMPRESSION: No fracture or dislocation. No significant change.   Electronically Signed   By: Natasha Mead M.D.   On: 06/22/2013 15:32    Independently reviewed by myself. X-rays from 04/13/2013 as well as the x-rays from 06/22/2013. There is no evidence for occult fracture. No degenerative changes. Overall normal.  Assessment and Plan:  Impression: 1. Peroneal tendonitis 2. Residual stiffness and loss of ROM post-trauma   Recommendations: 1. Formal PT for peroneal rehab and ROM 2. Daypro daily for now 3. Home rehab reviewed  We will see the patient back in 4-5 weeks if not improving. 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  Thank you for having [place] see Emily Payne in consultation.  Feel free to contact me with any questions.  Signed,  Elpidio Galea. Benisha Hadaway, MD, CAQ Sports Medicine   Healthcare at [facility] [address] [place], [place] [address] Phone: [phone] Fax: [phone]  Patient Instructions  Peroneal Tendon Rehab  Begin with easy walking, heel, toe and backwards * Try to pick an easy location like a hallway or a room in your house and do one  of these each time that you go through this area.  NOT YET Calf raises on a step - Pidgeon Toes, with toes turned inward Try to do most days of the week If pain persists at 3 sets of 30 - add backpack with 5 lbs Increase by 5 lbs per week to max of 30 lbs  Towel "Scrunch Ups" Use a hand towel or a moderate size towel Foot flat down on the towel Use toes to "scrunch up the towel" straight up and down, and going to the right and left.  3 sets of 20 * Can be done watching TV, reading, or sitting and relaxing.  REFERRAL: GO THE THE FRONT ROOM AT THE ENTRANCE OF OUR CLINIC, NEAR CHECK IN. ASK FOR MARION. SHE WILL HELP YOU SET UP YOUR REFERRAL. DATE: TIME:

## 2013-07-05 NOTE — Patient Instructions (Signed)
Peroneal Tendon Rehab  Begin with easy walking, heel, toe and backwards * Try to pick an easy location like a hallway or a room in your house and do one of these each time that you go through this area.  NOT YET Calf raises on a step - Pidgeon Toes, with toes turned inward Try to do most days of the week If pain persists at 3 sets of 30 - add backpack with 5 lbs Increase by 5 lbs per week to max of 30 lbs  Towel "Scrunch Ups" Use a hand towel or a moderate size towel Foot flat down on the towel Use toes to "scrunch up the towel" straight up and down, and going to the right and left.  3 sets of 20 * Can be done watching TV, reading, or sitting and relaxing.  REFERRAL: GO THE THE FRONT ROOM AT THE ENTRANCE OF OUR CLINIC, NEAR CHECK IN. ASK FOR MARION. SHE WILL HELP YOU SET UP YOUR REFERRAL. DATE: TIME:

## 2013-10-05 ENCOUNTER — Emergency Department: Payer: Self-pay | Admitting: Emergency Medicine

## 2013-11-29 ENCOUNTER — Ambulatory Visit: Payer: 59 | Admitting: Obstetrics and Gynecology

## 2013-12-13 ENCOUNTER — Encounter: Payer: Self-pay | Admitting: Family Medicine

## 2013-12-13 ENCOUNTER — Ambulatory Visit (INDEPENDENT_AMBULATORY_CARE_PROVIDER_SITE_OTHER): Payer: Commercial Managed Care - PPO | Admitting: Family Medicine

## 2013-12-13 VITALS — BP 117/79 | HR 70 | Ht 69.0 in | Wt 229.2 lb

## 2013-12-13 DIAGNOSIS — Z30432 Encounter for removal of intrauterine contraceptive device: Secondary | ICD-10-CM

## 2013-12-13 DIAGNOSIS — Z3169 Encounter for other general counseling and advice on procreation: Secondary | ICD-10-CM

## 2013-12-13 NOTE — Patient Instructions (Signed)
Preparing for Pregnancy Preparing for pregnancy (preconceptual care) by getting counseling and information from your caregiver before getting pregnant is a good idea. It will help you and your baby have a better chance to have a healthy, safe pregnancy and delivery of your baby. Make an appointment with your caregiver to talk about your health, medical, and family history and how to prepare yourself before getting pregnant. Your caregiver will do a complete physical exam and a Pap test. They will want to know:  About you, your spouse or partner, and your family's medical and genetic history.  If you are eating a balanced diet and drinking enough fluids.  What vitamins and mineral supplements you are taking. This includes taking folic acid before getting pregnant to help prevent birth defects.  What medications you are taking including prescription, over-the-counter and herbal medications.  If there is any substance abuse like alcohol, smoking, and illegal drugs.  If there is any mental or physical domestic violence.  If there is any risk of sexually transmitted disease between you and your partner.  What immunizations and vaccinations you have had and what you may need before getting pregnant.  If you should get tested for HIV infection.  If there is any exposure to chemical or toxic substances at home or work.  If there are medical problems you have that need to be treated and kept under control before getting pregnant such as diabetes, high blood pressure or others.  If there were any past surgeries, pregnancies and problems with them.  What your current weight is and to set a goal as to how much weight you should gain while pregnant. Also, they will check if you should lose or gain weight before getting pregnant.  What is your exercise routine and what it is safe when you are pregnant.  If there are any physical disabilities that need to be addressed.  About spacing your  pregnancies when there are other children.  If there is a financial problem that may affect you having a child. After talking about the above points with your caregiver, your caregiver will give you advice on how to help treat and work with you on solving any issues, if necessary, before getting pregnant. The goal is to have a healthy and safe pregnancy for you and your baby. You should keep an accurate record of your menstrual periods because it will help in determining your due date. Immunizations that you should have before getting pregnant:   Regular measles, German measles (rubella) and mumps.  Tetanus and diphtheria.  Chickenpox, if not immune.  Herpes zoster (Varicella) if not immune.  Human papilloma virus vaccine (HPV) between the age of 9 and 26 years old.  Hepatitis A vaccine.  Hepatitis B vaccine.  Influenza vaccine.  Pneumococcal vaccine (pneumonia). You should avoid getting pregnant for one month after getting vaccinated with a live virus vaccine such as German measles (rubella) which is in the MMR (Measles, Mumps and Rubella) vaccine. Other immunizations may be necessary depending on where you live, such as malaria. Ask your caregiver if any other immunizations are needed for you. HOME CARE INSTRUCTIONS   Follow the advice of your caregiver.  Before getting pregnant:  Begin taking vitamins, supplements, and 0.4 milligrams folic acid daily.  Get your immunizations up-to-date.  Get help from a nutrition counselor if you do not understand what a balanced diet is, need help with a special medical diet or if you need help to lose or gain weight.    Begin exercising.  Stop smoking, taking illegal drugs, and drinking alcoholic beverages.  Get counseling if there is and type of domestic violence.  Get checked for sexually transmitted diseases including HIV.  Get any medical problems under control (diabetes, high blood pressure, convulsions, asthma or  others).  Resolve any financial concerns or create a plan to do so.  Be sure you and your spouse or partner are ready to have a baby.  Keep an accurate record of your menstrual periods. Document Released: 08/19/2008 Document Revised: 06/27/2013 Document Reviewed: 08/19/2008 [facility] Patient Information 2014 [place].

## 2013-12-13 NOTE — Progress Notes (Signed)
    Subjective:    Patient ID: Tresea Mallshley Sudduth is a 23 y.o. female presenting with remove mirena iud  on 12/13/2013  HPI: Desires IUD removal.  Working to achieve pregnancy.  Review of Systems  Constitutional: Negative for fever and chills.  Respiratory: Negative for shortness of breath.   Cardiovascular: Negative for chest pain.  Gastrointestinal: Negative for nausea, vomiting and abdominal pain.  Genitourinary: Negative for dysuria.  Skin: Negative for rash.      Objective:    BP 117/79  Pulse 70  Ht 5\' 9"  (1.753 m)  Wt 229 lb 3.2 oz (103.964 kg)  BMI 33.83 kg/m2 Physical Exam  Constitutional: She is oriented to person, place, and time. She appears well-developed and well-nourished. No distress.  HENT:  Head: Normocephalic and atraumatic.  Eyes: No scleral icterus.  Neck: Neck supple.  Cardiovascular: Normal rate.   Pulmonary/Chest: Effort normal.  Abdominal: Soft.  Neurological: She is alert and oriented to person, place, and time.  Skin: Skin is warm and dry.  Psychiatric: She has a normal mood and affect.    Procedure: Speculum placed inside vagina.  Cervix visualized.  Strings grasped with ring forceps.  IUD removed intact.       Assessment & Plan:   Preconception- start PNV's. Return if symptoms worsen or fail to improve.

## 2014-03-06 ENCOUNTER — Encounter: Payer: Self-pay | Admitting: Obstetrics and Gynecology

## 2014-03-06 ENCOUNTER — Ambulatory Visit (INDEPENDENT_AMBULATORY_CARE_PROVIDER_SITE_OTHER): Payer: Medicaid Other | Admitting: Obstetrics and Gynecology

## 2014-03-06 VITALS — BP 134/84 | HR 89 | Wt 222.0 lb

## 2014-03-06 DIAGNOSIS — Z113 Encounter for screening for infections with a predominantly sexual mode of transmission: Secondary | ICD-10-CM

## 2014-03-06 DIAGNOSIS — Z124 Encounter for screening for malignant neoplasm of cervix: Secondary | ICD-10-CM

## 2014-03-06 DIAGNOSIS — Z3400 Encounter for supervision of normal first pregnancy, unspecified trimester: Secondary | ICD-10-CM | POA: Insufficient documentation

## 2014-03-06 NOTE — Patient Instructions (Signed)
Pregnancy - First Trimester During sexual intercourse, millions of sperm go into the vagina. Only 1 sperm will penetrate and fertilize the female egg while it is in the Fallopian tube. One week later, the fertilized egg implants into the wall of the uterus. An embryo begins to develop into a baby. At 6 to 8 weeks, the eyes and face are formed and the heartbeat can be seen on ultrasound. At the end of 12 weeks (first trimester), all the baby's organs are formed. Now that you are pregnant, you will want to do everything you can to have a healthy baby. Two of the most important things are to get good prenatal care and follow your caregiver's instructions. Prenatal care is all the medical care you receive before the baby's birth. It is given to prevent, find, and treat problems during the pregnancy and childbirth. PRENATAL EXAMS  During prenatal visits, your weight, blood pressure, and urine are checked. This is done to make sure you are healthy and progressing normally during the pregnancy.  A pregnant woman should gain 25 to 35 pounds during the pregnancy. However, if you are overweight or underweight, your caregiver will advise you regarding your weight.  Your caregiver will ask and answer questions for you.  Blood work, cervical cultures, other necessary tests, and a Pap test are done during your prenatal exams. These tests are done to check on your health and the probable health of your baby. Tests are strongly recommended and done for HIV with your permission. This is the virus that causes AIDS. These tests are done because medicines can be given to help prevent your baby from being born with this infection should you have been infected without knowing it. Blood work is also used to find out your blood type, previous infections, and follow your blood levels (hemoglobin).  Low hemoglobin (anemia) is common during pregnancy. Iron and vitamins are given to help prevent this. Later in the pregnancy,  blood tests for diabetes will be done along with any other tests if any problems develop.  You may need other tests to make sure you and the baby are doing well. CHANGES DURING THE FIRST TRIMESTER  Your body goes through many changes during pregnancy. They vary from person to person. Talk to your caregiver about changes you notice and are concerned about. Changes can include:  Your menstrual period stops.  The egg and sperm carry the genes that determine what you look like. Genes from you and your partner are forming a baby. The female genes determine whether the baby is a boy or a girl.  Your body increases in girth and you may feel bloated.  Feeling sick to your stomach (nauseous) and throwing up (vomiting). If the vomiting is uncontrollable, call your caregiver.  Your breasts will begin to enlarge and become tender.  Your nipples may stick out more and become darker.  The need to urinate more. Painful urination may mean you have a bladder infection.  Tiring easily.  Loss of appetite.  Cravings for certain kinds of food.  At first, you may gain or lose a couple of pounds.  You may have changes in your emotions from day to day (excited to be pregnant or concerned something may go wrong with the pregnancy and baby).  You may have more vivid and strange dreams. HOME CARE INSTRUCTIONS   It is very important to avoid all smoking, alcohol and non-prescribed drugs during your pregnancy. These affect the formation and growth of the baby.   Avoid chemicals while pregnant to ensure the delivery of a healthy infant.  Start your prenatal visits by the 12th week of pregnancy. They are usually scheduled monthly at first, then more often in the last 2 months before delivery. Keep your caregiver's appointments. Follow your caregiver's instructions regarding medicine use, blood and lab tests, exercise, and diet.  During pregnancy, you are providing food for you and your baby. Eat regular,  well-balanced meals. Choose foods such as meat, fish, milk and other low fat dairy products, vegetables, fruits, and whole-grain breads and cereals. Your caregiver will tell you of the ideal weight gain.  You can help morning sickness by keeping soda crackers at the bedside. Eat a couple before arising in the morning. You may want to use the crackers without salt on them.  Eating 4 to 5 small meals rather than 3 large meals a day also may help the nausea and vomiting.  Drinking liquids between meals instead of during meals also seems to help nausea and vomiting.  A physical sexual relationship may be continued throughout pregnancy if there are no other problems. Problems may be early (premature) leaking of amniotic fluid from the membranes, vaginal bleeding, or belly (abdominal) pain.  Exercise regularly if there are no restrictions. Check with your caregiver or physical therapist if you are unsure of the safety of some of your exercises. Greater weight gain will occur in the last 2 trimesters of pregnancy. Exercising will help:  Control your weight.  Keep you in shape.  Prepare you for labor and delivery.  Help you lose your pregnancy weight after you deliver your baby.  Wear a good support or jogging bra for breast tenderness during pregnancy. This may help if worn during sleep too.  Ask when prenatal classes are available. Begin classes when they are offered.  Do not use hot tubs, steam rooms, or saunas.  Wear your seat belt when driving. This protects you and your baby if you are in an accident.  Avoid raw meat, uncooked cheese, cat litter boxes, and soil used by cats throughout the pregnancy. These carry germs that can cause birth defects in the baby.  The first trimester is a good time to visit your dentist for your dental health. Getting your teeth cleaned is okay. Use a softer toothbrush and brush gently during pregnancy.  Ask for help if you have financial, counseling, or  nutritional needs during pregnancy. Your caregiver will be able to offer counseling for these needs as well as refer you for other special needs.  Do not take any medicines or herbs unless told by your caregiver.  Inform your caregiver if there is any mental or physical domestic violence.  Make a list of emergency phone numbers of family, friends, hospital, and police and fire departments.  Write down your questions. Take them to your prenatal visit.  Do not douche.  Do not cross your legs.  If you have to stand for long periods of time, rotate you feet or take small steps in a circle.  You may have more vaginal secretions that may require a sanitary pad. Do not use tampons or scented sanitary pads. MEDICINES AND DRUG USE IN PREGNANCY  Take prenatal vitamins as directed. The vitamin should contain 1 milligram of folic acid. Keep all vitamins out of reach of children. Only a couple vitamins or tablets containing iron may be fatal to a baby or young child when ingested.  Avoid use of all medicines, including herbs, over-the-counter medicines, not   prescribed or suggested by your caregiver. Only take over-the-counter or prescription medicines for pain, discomfort, or fever as directed by your caregiver. Do not use aspirin, ibuprofen, or naproxen unless directed by your caregiver.  Let your caregiver also know about herbs you may be using.  Alcohol is related to a number of birth defects. This includes fetal alcohol syndrome. All alcohol, in any form, should be avoided completely. Smoking will cause low birth rate and premature babies.  Street or illegal drugs are very harmful to the baby. They are absolutely forbidden. A baby born to an addicted mother will be addicted at birth. The baby will go through the same withdrawal an adult does.  Let your caregiver know about any medicines that you have to take and for what reason you take them. SEEK MEDICAL CARE IF:  You have any concerns or  worries during your pregnancy. It is better to call with your questions if you feel they cannot wait, rather than worry about them. SEEK IMMEDIATE MEDICAL CARE IF:   An unexplained oral temperature above 102 F (38.9 C) develops, or as your caregiver suggests.  You have leaking of fluid from the vagina (birth canal). If leaking membranes are suspected, take your temperature and inform your caregiver of this when you call.  There is vaginal spotting or bleeding. Notify your caregiver of the amount and how many pads are used.  You develop a bad smelling vaginal discharge with a change in the color.  You continue to feel sick to your stomach (nauseated) and have no relief from remedies suggested. You vomit blood or coffee ground-like materials.  You lose more than 2 pounds of weight in 1 week.  You gain more than 2 pounds of weight in 1 week and you notice swelling of your face, hands, feet, or legs.  You gain 5 pounds or more in 1 week (even if you do not have swelling of your hands, face, legs, or feet).  You get exposed to [place] measles and have never had them.  You are exposed to fifth disease or chickenpox.  You develop belly (abdominal) pain. Round ligament discomfort is a common non-cancerous (benign) cause of abdominal pain in pregnancy. Your caregiver still must evaluate this.  You develop headache, fever, diarrhea, pain with urination, or shortness of breath.  You fall or are in a car accident or have any kind of trauma.  There is mental or physical violence in your home. Document Released: 08/31/2001 Document Revised: 05/31/2012 Document Reviewed: 07/17/2013 [facility] Patient Information 2015 [place], [place]. This information is not intended to replace advice given to you by your health care Chelcey Caputo. Make sure you discuss any questions you have with your health care Klinton Candelas.  Contraception Choices Contraception (birth control) is the use of any methods or devices to  prevent pregnancy. Below are some methods to help avoid pregnancy. HORMONAL METHODS   Contraceptive implant. This is a thin, plastic tube containing progesterone hormone. It does not contain estrogen hormone. Your health care Lindsee Labarre inserts the tube in the inner part of the upper arm. The tube can remain in place for up to 3 years. After 3 years, the implant must be removed. The implant prevents the ovaries from releasing an egg (ovulation), thickens the cervical mucus to prevent sperm from entering the uterus, and thins the lining of the inside of the uterus.  Progesterone-only injections. These injections are given every 3 months by your health care Mayur Duman to prevent pregnancy. This synthetic progesterone hormone  stops the ovaries from releasing eggs. It also thickens cervical mucus and changes the uterine lining. This makes it harder for sperm to survive in the uterus.  Birth control pills. These pills contain estrogen and progesterone hormone. They work by preventing the ovaries from releasing eggs (ovulation). They also cause the cervical mucus to thicken, preventing the sperm from entering the uterus. Birth control pills are prescribed by a health care Saivon Prowse.Birth control pills can also be used to treat heavy periods.  Minipill. This type of birth control pill contains only the progesterone hormone. They are taken every day of each month and must be prescribed by your health care Amirrah Quigley.  Birth control patch. The patch contains hormones similar to those in birth control pills. It must be changed once a week and is prescribed by a health care Mell Guia.  Vaginal ring. The ring contains hormones similar to those in birth control pills. It is left in the vagina for 3 weeks, removed for 1 week, and then a new one is put back in place. The patient must be comfortable inserting and removing the ring from the vagina.A health care Aeon Kessner's prescription is necessary.  Emergency contraception.  Emergency contraceptives prevent pregnancy after unprotected sexual intercourse. This pill can be taken right after sex or up to 5 days after unprotected sex. It is most effective the sooner you take the pills after having sexual intercourse. Most emergency contraceptive pills are available without a prescription. Check with your pharmacist. Do not use emergency contraception as your only form of birth control. BARRIER METHODS   Female condom. This is a thin sheath (latex or rubber) that is worn over the penis during sexual intercourse. It can be used with spermicide to increase effectiveness.  Female condom. This is a soft, loose-fitting sheath that is put into the vagina before sexual intercourse.  Diaphragm. This is a soft, latex, dome-shaped barrier that must be fitted by a health care Ulysse Siemen. It is inserted into the vagina, along with a spermicidal jelly. It is inserted before intercourse. The diaphragm should be left in the vagina for 6 to 8 hours after intercourse.  Cervical cap. This is a round, soft, latex or plastic cup that fits over the cervix and must be fitted by a health care Carlito Bogert. The cap can be left in place for up to 48 hours after intercourse.  Sponge. This is a soft, circular piece of polyurethane foam. The sponge has spermicide in it. It is inserted into the vagina after wetting it and before sexual intercourse.  Spermicides. These are chemicals that kill or block sperm from entering the cervix and uterus. They come in the form of creams, jellies, suppositories, foam, or tablets. They do not require a prescription. They are inserted into the vagina with an applicator before having sexual intercourse. The process must be repeated every time you have sexual intercourse. INTRAUTERINE CONTRACEPTION  Intrauterine device (IUD). This is a T-shaped device that is put in a woman's uterus during a menstrual period to prevent pregnancy. There are 2 types:  Copper IUD. This type of IUD  is wrapped in copper wire and is placed inside the uterus. Copper makes the uterus and fallopian tubes produce a fluid that kills sperm. It can stay in place for 10 years.  Hormone IUD. This type of IUD contains the hormone progestin (synthetic progesterone). The hormone thickens the cervical mucus and prevents sperm from entering the uterus, and it also thins the uterine lining to prevent implantation of a  fertilized egg. The hormone can weaken or kill the sperm that get into the uterus. It can stay in place for 3-5 years, depending on which type of IUD is used. PERMANENT METHODS OF CONTRACEPTION  Female tubal ligation. This is when the woman's fallopian tubes are surgically sealed, tied, or blocked to prevent the egg from traveling to the uterus.  Hysteroscopic sterilization. This involves placing a small coil or insert into each fallopian tube. Your doctor uses a technique called hysteroscopy to do the procedure. The device causes scar tissue to form. This results in permanent blockage of the fallopian tubes, so the sperm cannot fertilize the egg. It takes about 3 months after the procedure for the tubes to become blocked. You must use another form of birth control for these 3 months.  Female sterilization. This is when the female has the tubes that carry sperm tied off (vasectomy).This blocks sperm from entering the vagina during sexual intercourse. After the procedure, the man can still ejaculate fluid (semen). NATURAL PLANNING METHODS  Natural family planning. This is not having sexual intercourse or using a barrier method (condom, diaphragm, cervical cap) on days the woman could become pregnant.  Calendar method. This is keeping track of the length of each menstrual cycle and identifying when you are fertile.  Ovulation method. This is avoiding sexual intercourse during ovulation.  Symptothermal method. This is avoiding sexual intercourse during ovulation, using a thermometer and ovulation  symptoms.  Post-ovulation method. This is timing sexual intercourse after you have ovulated. Regardless of which type or method of contraception you choose, it is important that you use condoms to protect against the transmission of sexually transmitted infections (STIs). Talk with your health care Aleaya Latona about which form of contraception is most appropriate for you. Document Released: 09/06/2005 Document Revised: 09/11/2013 Document Reviewed: 03/01/2013 [facility] Patient Information 2015 [place], [place]. This information is not intended to replace advice given to you by your health care Shalece Staffa. Make sure you discuss any questions you have with your health care Duha Abair.  Breastfeeding Deciding to breastfeed is one of the best choices you can make for you and your baby. A change in hormones during pregnancy causes your breast tissue to grow and increases the number and size of your milk ducts. These hormones also allow proteins, sugars, and fats from your blood supply to make breast milk in your milk-producing glands. Hormones prevent breast milk from being released before your baby is born as well as prompt milk flow after birth. Once breastfeeding has begun, thoughts of your baby, as well as his or her sucking or crying, can stimulate the release of milk from your milk-producing glands.  BENEFITS OF BREASTFEEDING For Your Baby  Your first milk (colostrum) helps your baby's digestive system function better.   There are antibodies in your milk that help your baby fight off infections.   Your baby has a lower incidence of asthma, allergies, and sudden infant death syndrome.   The nutrients in breast milk are better for your baby than infant formulas and are designed uniquely for your baby's needs.   Breast milk improves your baby's brain development.   Your baby is less likely to develop other conditions, such as childhood obesity, asthma, or type 2 diabetes mellitus.  For You    Breastfeeding helps to create a very special bond between you and your baby.   Breastfeeding is convenient. Breast milk is always available at the correct temperature and costs nothing.   Breastfeeding helps to burn  calories and helps you lose the weight gained during pregnancy.   Breastfeeding makes your uterus contract to its prepregnancy size faster and slows bleeding (lochia) after you give birth.   Breastfeeding helps to lower your risk of developing type 2 diabetes mellitus, osteoporosis, and breast or ovarian cancer later in life. SIGNS THAT YOUR BABY IS HUNGRY Early Signs of Hunger  Increased alertness or activity.  Stretching.  Movement of the head from side to side.  Movement of the head and opening of the mouth when the corner of the mouth or cheek is stroked (rooting).  Increased sucking sounds, smacking lips, cooing, sighing, or squeaking.  Hand-to-mouth movements.  Increased sucking of fingers or hands. Late Signs of Hunger  Fussing.  Intermittent crying. Extreme Signs of Hunger Signs of extreme hunger will require calming and consoling before your baby will be able to breastfeed successfully. Do not wait for the following signs of extreme hunger to occur before you initiate breastfeeding:   Restlessness.  A loud, strong cry.   Screaming. BREASTFEEDING BASICS Breastfeeding Initiation  Find a comfortable place to sit or lie down, with your neck and back well supported.  Place a pillow or rolled up blanket under your baby to bring him or her to the level of your breast (if you are seated). Nursing pillows are specially designed to help support your arms and your baby while you breastfeed.  Make sure that your baby's abdomen is facing your abdomen.   Gently massage your breast. With your fingertips, massage from your chest wall toward your nipple in a circular motion. This encourages milk flow. You may need to continue this action during the  feeding if your milk flows slowly.  Support your breast with 4 fingers underneath and your thumb above your nipple. Make sure your fingers are well away from your nipple and your baby's mouth.   Stroke your baby's lips gently with your finger or nipple.   When your baby's mouth is open wide enough, quickly bring your baby to your breast, placing your entire nipple and as much of the colored area around your nipple (areola) as possible into your baby's mouth.   More areola should be visible above your baby's upper lip than below the lower lip.   Your baby's tongue should be between his or her lower gum and your breast.   Ensure that your baby's mouth is correctly positioned around your nipple (latched). Your baby's lips should create a seal on your breast and be turned out (everted).  It is common for your baby to suck about 2-3 minutes in order to start the flow of breast milk. Latching Teaching your baby how to latch on to your breast properly is very important. An improper latch can cause nipple pain and decreased milk supply for you and poor weight gain in your baby. Also, if your baby is not latched onto your nipple properly, he or she may swallow some air during feeding. This can make your baby fussy. Burping your baby when you switch breasts during the feeding can help to get rid of the air. However, teaching your baby to latch on properly is still the best way to prevent fussiness from swallowing air while breastfeeding. Signs that your baby has successfully latched on to your nipple:    Silent tugging or silent sucking, without causing you pain.   Swallowing heard between every 3-4 sucks.    Muscle movement above and in front of his or her ears while  sucking.  Signs that your baby has not successfully latched on to nipple:   Sucking sounds or smacking sounds from your baby while breastfeeding.  Nipple pain. If you think your baby has not latched on correctly, slip your  finger into the corner of your baby's mouth to break the suction and place it between your baby's gums. Attempt breastfeeding initiation again. Signs of Successful Breastfeeding Signs from your baby:   A gradual decrease in the number of sucks or complete cessation of sucking.   Falling asleep.   Relaxation of his or her body.   Retention of a small amount of milk in his or her mouth.   Letting go of your breast by himself or herself. Signs from you:  Breasts that have increased in firmness, weight, and size 1-3 hours after feeding.   Breasts that are softer immediately after breastfeeding.  Increased milk volume, as well as a change in milk consistency and color by the fifth day of breastfeeding.   Nipples that are not sore, cracked, or bleeding. Signs That Your Pecola LeisureBaby is Getting Enough Milk  Wetting at least 3 diapers in a 24-hour period. The urine should be clear and pale yellow by age 909 days.  At least 3 stools in a 24-hour period by age 909 days. The stool should be soft and yellow.  At least 3 stools in a 24-hour period by age 90 days. The stool should be seedy and yellow.  No loss of weight greater than 10% of birth weight during the first 263 days of age.  Average weight gain of 4-7 ounces (113-198 g) per week after age 94 days.  Consistent daily weight gain by age 909 days, without weight loss after the age of 2 weeks. After a feeding, your baby may spit up a small amount. This is common. BREASTFEEDING FREQUENCY AND DURATION Frequent feeding will help you make more milk and can prevent sore nipples and breast engorgement. Breastfeed when you feel the need to reduce the fullness of your breasts or when your baby shows signs of hunger. This is called "breastfeeding on demand." Avoid introducing a pacifier to your baby while you are working to establish breastfeeding (the first 4-6 weeks after your baby is born). After this time you may choose to use a pacifier. Research has  shown that pacifier use during the first year of a baby's life decreases the risk of sudden infant death syndrome (SIDS). Allow your baby to feed on each breast as long as he or she wants. Breastfeed until your baby is finished feeding. When your baby unlatches or falls asleep while feeding from the first breast, offer the second breast. Because newborns are often sleepy in the first few weeks of life, you may need to awaken your baby to get him or her to feed. Breastfeeding times will vary from baby to baby. However, the following rules can serve as a guide to help you ensure that your baby is properly fed:  Newborns (babies 374 weeks of age or younger) may breastfeed every 1-3 hours.  Newborns should not go longer than 3 hours during the day or 5 hours during the night without breastfeeding.  You should breastfeed your baby a minimum of 8 times in a 24-hour period until you begin to introduce solid foods to your baby at around 746 months of age. BREAST MILK PUMPING Pumping and storing breast milk allows you to ensure that your baby is exclusively fed your breast milk, even at times  when you are unable to breastfeed. This is especially important if you are going back to work while you are still breastfeeding or when you are not able to be present during feedings. Your lactation consultant can give you guidelines on how long it is safe to store breast milk.  A breast pump is a machine that allows you to pump milk from your breast into a sterile bottle. The pumped breast milk can then be stored in a refrigerator or freezer. Some breast pumps are operated by hand, while others use electricity. Ask your lactation consultant which type will work best for you. Breast pumps can be purchased, but some hospitals and breastfeeding support groups lease breast pumps on a monthly basis. A lactation consultant can teach you how to hand express breast milk, if you prefer not to use a pump.  CARING FOR YOUR BREASTS WHILE  YOU BREASTFEED Nipples can become dry, cracked, and sore while breastfeeding. The following recommendations can help keep your breasts moisturized and healthy:  Avoid using soap on your nipples.   Wear a supportive bra. Although not required, special nursing bras and tank tops are designed to allow access to your breasts for breastfeeding without taking off your entire bra or top. Avoid wearing underwire-style bras or extremely tight bras.  Air dry your nipples for 3-884minutes after each feeding.   Use only cotton bra pads to absorb leaked breast milk. Leaking of breast milk between feedings is normal.   Use lanolin on your nipples after breastfeeding. Lanolin helps to maintain your skin's normal moisture barrier. If you use pure lanolin, you do not need to wash it off before feeding your baby again. Pure lanolin is not toxic to your baby. You may also hand express a few drops of breast milk and gently massage that milk into your nipples and allow the milk to air dry. In the first few weeks after giving birth, some women experience extremely full breasts (engorgement). Engorgement can make your breasts feel heavy, warm, and tender to the touch. Engorgement peaks within 3-5 days after you give birth. The following recommendations can help ease engorgement:  Completely empty your breasts while breastfeeding or pumping. You may want to start by applying warm, moist heat (in the shower or with warm water-soaked hand towels) just before feeding or pumping. This increases circulation and helps the milk flow. If your baby does not completely empty your breasts while breastfeeding, pump any extra milk after he or she is finished.  Wear a snug bra (nursing or regular) or tank top for 1-2 days to signal your body to slightly decrease milk production.  Apply ice packs to your breasts, unless this is too uncomfortable for you.  Make sure that your baby is latched on and positioned properly while  breastfeeding. If engorgement persists after 48 hours of following these recommendations, contact your health care Evolette Pendell or a Advertising copywriterlactation consultant. OVERALL HEALTH CARE RECOMMENDATIONS WHILE BREASTFEEDING  Eat healthy foods. Alternate between meals and snacks, eating 3 of each per day. Because what you eat affects your breast milk, some of the foods may make your baby more irritable than usual. Avoid eating these foods if you are sure that they are negatively affecting your baby.  Drink milk, fruit juice, and water to satisfy your thirst (about 10 glasses a day).   Rest often, relax, and continue to take your prenatal vitamins to prevent fatigue, stress, and anemia.  Continue breast self-awareness checks.  Avoid chewing and smoking tobacco.  Avoid alcohol and drug use. Some medicines that may be harmful to your baby can pass through breast milk. It is important to ask your health care Stepheny Canal before taking any medicine, including all over-the-counter and prescription medicine as well as vitamin and herbal supplements. It is possible to become pregnant while breastfeeding. If birth control is desired, ask your health care Dynasty Holquin about options that will be safe for your baby. SEEK MEDICAL CARE IF:   You feel like you want to stop breastfeeding or have become frustrated with breastfeeding.  You have painful breasts or nipples.  Your nipples are cracked or bleeding.  Your breasts are red, tender, or warm.  You have a swollen area on either breast.  You have a fever or chills.  You have nausea or vomiting.  You have drainage other than breast milk from your nipples.  Your breasts do not become full before feedings by the fifth day after you give birth.  You feel sad and depressed.  Your baby is too sleepy to eat well.  Your baby is having trouble sleeping.   Your baby is wetting less than 3 diapers in a 24-hour period.  Your baby has less than 3 stools in a 24-hour  period.  Your baby's skin or the white part of his or her eyes becomes yellow.   Your baby is not gaining weight by 36 days of age. SEEK IMMEDIATE MEDICAL CARE IF:   Your baby is overly tired (lethargic) and does not want to wake up and feed.  Your baby develops an unexplained fever. Document Released: 09/06/2005 Document Revised: 09/11/2013 Document Reviewed: 02/28/2013 [facility] Patient Information 2015 [place], [place]. This information is not intended to replace advice given to you by your health care Kendrik Mcshan. Make sure you discuss any questions you have with your health care Farhan Jean.

## 2014-03-06 NOTE — Progress Notes (Signed)
   Subjective:    Emily Payne is a G1P0000 [id number] being seen today for her first obstetrical visit.  Her obstetrical history is significant for first pregnancy. Patient does intend to breast feed. Pregnancy history fully reviewed.  Patient reports no complaints.  Filed Vitals:   03/06/14 1058  BP: 134/84  Pulse: 89  Weight: 222 lb (100.699 kg)    HISTORY: OB History  Gravida Para Term Preterm AB SAB TAB Ectopic Multiple Living  1 0 0 0 0 0 0 0 0 0     # Outcome Date GA Lbr Len/2nd Weight Sex Delivery Anes PTL Lv  1 CUR              Past Medical History  Diagnosis Date  . Hypoglycemic syndrome   . Migraines    Past Surgical History  Procedure Laterality Date  . Tonsillectomy    . Wisdom tooth extraction    . Abscess tooth     Family History  Problem Relation Age of Onset  . Ovarian cysts Mother   . Stroke Maternal Grandmother   . Cancer Maternal Grandmother 27    uterine cancer     Exam    Uterus:     Pelvic Exam:    Perineum: Normal Perineum   Vulva: normal   Vagina:  normal mucosa, normal discharge   pH:    Cervix: closed and long   Adnexa: normal adnexa and no mass, fullness, tenderness   Bony Pelvis: android  System: Breast:  normal appearance, no masses or tenderness   Skin: normal coloration and turgor, no rashes    Neurologic: oriented, no focal deficits   Extremities: normal strength, tone, and muscle mass   HEENT extra ocular movement intact   Mouth/Teeth mucous membranes moist, pharynx normal without lesions and dental hygiene good   Neck supple and no masses   Cardiovascular: regular rate and rhythm   Respiratory:  chest clear, no wheezing, crepitations, rhonchi, normal symmetric air entry   Abdomen: soft, non-tender; bowel sounds normal; no masses,  no organomegaly   Urinary:       Assessment:    Pregnancy: G1P0000 Patient Active Problem List   Diagnosis Date Noted  . Supervision of normal first pregnancy 03/06/2014  . Dandruff  06/22/2013  . Right foot pain 06/22/2013  . Bipolar disorder, unspecified 06/22/2013  . Obesity 07/04/2012  . Migraine with aura 07/03/2012  . Contraception 07/03/2012        Plan:     Initial labs drawn. Prenatal vitamins. Problem list reviewed and updated. Genetic Screening discussed First Screen: interested but is awaiting Medicaid.  Ultrasound discussed; fetal survey: requested.  Follow up in 4 weeks. 50% of 30 min visit spent on counseling and coordination of care.     CONSTANT,PEGGY 03/06/2014

## 2014-03-06 NOTE — Addendum Note (Signed)
Addended by: Tandy GawHINTON, Kesia Dalto C on: 03/06/2014 11:29 AM   Modules accepted: Orders

## 2014-03-07 ENCOUNTER — Encounter: Payer: Self-pay | Admitting: Obstetrics and Gynecology

## 2014-03-07 DIAGNOSIS — O26899 Other specified pregnancy related conditions, unspecified trimester: Secondary | ICD-10-CM

## 2014-03-07 DIAGNOSIS — Z6791 Unspecified blood type, Rh negative: Secondary | ICD-10-CM | POA: Insufficient documentation

## 2014-03-07 LAB — OBSTETRIC PANEL
ANTIBODY SCREEN: NEGATIVE
BASOS ABS: 0 10*3/uL (ref 0.0–0.1)
BASOS PCT: 0 % (ref 0–1)
Eosinophils Absolute: 0.2 10*3/uL (ref 0.0–0.7)
Eosinophils Relative: 2 % (ref 0–5)
HEMATOCRIT: 40.2 % (ref 36.0–46.0)
Hemoglobin: 14 g/dL (ref 12.0–15.0)
Hepatitis B Surface Ag: NEGATIVE
Lymphocytes Relative: 19 % (ref 12–46)
Lymphs Abs: 2.1 10*3/uL (ref 0.7–4.0)
MCH: 30.2 pg (ref 26.0–34.0)
MCHC: 34.8 g/dL (ref 30.0–36.0)
MCV: 86.6 fL (ref 78.0–100.0)
MONO ABS: 0.5 10*3/uL (ref 0.1–1.0)
Monocytes Relative: 4 % (ref 3–12)
NEUTROS ABS: 8.5 10*3/uL — AB (ref 1.7–7.7)
Neutrophils Relative %: 75 % (ref 43–77)
PLATELETS: 267 10*3/uL (ref 150–400)
RBC: 4.64 MIL/uL (ref 3.87–5.11)
RDW: 13.3 % (ref 11.5–15.5)
Rh Type: NEGATIVE
Rubella: 3.78 Index — ABNORMAL HIGH (ref <0.90)
WBC: 11.3 10*3/uL — ABNORMAL HIGH (ref 4.0–10.5)

## 2014-03-07 LAB — CYTOLOGY - PAP

## 2014-03-07 LAB — HIV ANTIBODY (ROUTINE TESTING W REFLEX): HIV: NONREACTIVE

## 2014-03-08 LAB — CULTURE, OB URINE
Colony Count: NO GROWTH
ORGANISM ID, BACTERIA: NO GROWTH

## 2014-03-09 ENCOUNTER — Encounter: Payer: Self-pay | Admitting: Obstetrics and Gynecology

## 2014-03-09 DIAGNOSIS — O26899 Other specified pregnancy related conditions, unspecified trimester: Secondary | ICD-10-CM

## 2014-03-09 DIAGNOSIS — Z6791 Unspecified blood type, Rh negative: Secondary | ICD-10-CM | POA: Insufficient documentation

## 2014-03-09 HISTORY — DX: Unspecified blood type, rh negative: Z67.91

## 2014-03-12 ENCOUNTER — Encounter (HOSPITAL_COMMUNITY): Payer: Self-pay

## 2014-03-12 LAB — CYSTIC FIBROSIS DIAGNOSTIC STUDY

## 2014-03-29 ENCOUNTER — Ambulatory Visit (INDEPENDENT_AMBULATORY_CARE_PROVIDER_SITE_OTHER): Payer: Medicaid Other | Admitting: Family Medicine

## 2014-03-29 VITALS — BP 120/75 | HR 79 | Wt 217.2 lb

## 2014-03-29 DIAGNOSIS — G43109 Migraine with aura, not intractable, without status migrainosus: Secondary | ICD-10-CM

## 2014-03-29 DIAGNOSIS — Z3400 Encounter for supervision of normal first pregnancy, unspecified trimester: Secondary | ICD-10-CM

## 2014-03-29 MED ORDER — CYCLOBENZAPRINE HCL 10 MG PO TABS: 10.0000 mg | ORAL_TABLET | Freq: Three times a day (TID) | ORAL | Status: DC | PRN

## 2014-03-29 NOTE — Patient Instructions (Signed)
Second Trimester of Pregnancy The second trimester is from week 13 through week 28, months 4 through 6. The second trimester is often a time when you feel your best. Your body has also adjusted to being pregnant, and you begin to feel better physically. Usually, morning sickness has lessened or quit completely, you may have more energy, and you may have an increase in appetite. The second trimester is also a time when the fetus is growing rapidly. At the end of the sixth month, the fetus is about 9 inches long and weighs about 1 pounds. You will likely begin to feel the baby move (quickening) between 18 and 20 weeks of the pregnancy. BODY CHANGES Your body goes through many changes during pregnancy. The changes vary from woman to woman.   Your weight will continue to increase. You will notice your lower abdomen bulging out.  You may begin to get stretch marks on your hips, abdomen, and breasts.  You may develop headaches that can be relieved by medicines approved by your health care provider.  You may urinate more often because the fetus is pressing on your bladder.  You may develop or continue to have heartburn as a result of your pregnancy.  You may develop constipation because certain hormones are causing the muscles that push waste through your intestines to slow down.  You may develop hemorrhoids or swollen, bulging veins (varicose veins).  You may have back pain because of the weight gain and pregnancy hormones relaxing your joints between the bones in your pelvis and as a result of a shift in weight and the muscles that support your balance.  Your breasts will continue to grow and be tender.  Your gums may bleed and may be sensitive to brushing and flossing.  Dark spots or blotches (chloasma, mask of pregnancy) may develop on your face. This will likely fade after the baby is born.  A dark line from your belly button to the pubic area (linea nigra) may appear. This will likely  fade after the baby is born.  You may have changes in your hair. These can include thickening of your hair, rapid growth, and changes in texture. Some women also have hair loss during or after pregnancy, or hair that feels dry or thin. Your hair will most likely return to normal after your baby is born. WHAT TO EXPECT AT YOUR PRENATAL VISITS During a routine prenatal visit:  You will be weighed to make sure you and the fetus are growing normally.  Your blood pressure will be taken.  Your abdomen will be measured to track your baby's growth.  The fetal heartbeat will be listened to.  Any test results from the previous visit will be discussed. Your health care provider may ask you:  How you are feeling.  If you are feeling the baby move.  If you have had any abnormal symptoms, such as leaking fluid, bleeding, severe headaches, or abdominal cramping.  If you have any questions. Other tests that may be performed during your second trimester include:  Blood tests that check for:  Low iron levels (anemia).  Gestational diabetes (between 24 and 28 weeks).  Rh antibodies.  Urine tests to check for infections, diabetes, or protein in the urine.  An ultrasound to confirm the proper growth and development of the baby.  An amniocentesis to check for possible genetic problems.  Fetal screens for spina bifida and Down syndrome. HOME CARE INSTRUCTIONS   Avoid all smoking, herbs, alcohol, and unprescribed   drugs. These chemicals affect the formation and growth of the baby.  Follow your health care provider's instructions regarding medicine use. There are medicines that are either safe or unsafe to take during pregnancy.  Exercise only as directed by your health care provider. Experiencing uterine cramps is a good sign to stop exercising.  Continue to eat regular, healthy meals.  Wear a good support bra for breast tenderness.  Do not use hot tubs, steam rooms, or saunas.  Wear  your seat belt at all times when driving.  Avoid raw meat, uncooked cheese, cat litter boxes, and soil used by cats. These carry germs that can cause birth defects in the baby.  Take your prenatal vitamins.  Try taking a stool softener (if your health care provider approves) if you develop constipation. Eat more high-fiber foods, such as fresh vegetables or fruit and whole grains. Drink plenty of fluids to keep your urine clear or pale yellow.  Take warm sitz baths to soothe any pain or discomfort caused by hemorrhoids. Use hemorrhoid cream if your health care provider approves.  If you develop varicose veins, wear support hose. Elevate your feet for 15 minutes, 3-4 times a day. Limit salt in your diet.  Avoid heavy lifting, wear low heel shoes, and practice good posture.  Rest with your legs elevated if you have leg cramps or low back pain.  Visit your dentist if you have not gone yet during your pregnancy. Use a soft toothbrush to brush your teeth and be gentle when you floss.  A sexual relationship may be continued unless your health care provider directs you otherwise.  Continue to go to all your prenatal visits as directed by your health care provider. SEEK MEDICAL CARE IF:   You have dizziness.  You have mild pelvic cramps, pelvic pressure, or nagging pain in the abdominal area.  You have persistent nausea, vomiting, or diarrhea.  You have a bad smelling vaginal discharge.  You have pain with urination. SEEK IMMEDIATE MEDICAL CARE IF:   You have a fever.  You are leaking fluid from your vagina.  You have spotting or bleeding from your vagina.  You have severe abdominal cramping or pain.  You have rapid weight gain or loss.  You have shortness of breath with chest pain.  You notice sudden or extreme swelling of your face, hands, ankles, feet, or legs.  You have not felt your baby move in over an hour.  You have severe headaches that do not go away with  medicine.  You have vision changes. Document Released: 08/31/2001 Document Revised: 09/11/2013 Document Reviewed: 11/07/2012 [employer] Patient Information 2015 [employer]. This information is not intended to replace advice given to you by your health care provider. Make sure you discuss any questions you have with your health care provider.  Breastfeeding Deciding to breastfeed is one of the best choices you can make for you and your baby. A change in hormones during pregnancy causes your breast tissue to grow and increases the number and size of your milk ducts. These hormones also allow proteins, sugars, and fats from your blood supply to make breast milk in your milk-producing glands. Hormones prevent breast milk from being released before your baby is born as well as prompt milk flow after birth. Once breastfeeding has begun, thoughts of your baby, as well as his or her sucking or crying, can stimulate the release of milk from your milk-producing glands.  BENEFITS OF BREASTFEEDING For Your Baby  Your first   milk (colostrum) helps your baby's digestive system function better.   There are antibodies in your milk that help your baby fight off infections.   Your baby has a lower incidence of asthma, allergies, and sudden infant death syndrome.   The nutrients in breast milk are better for your baby than infant formulas and are designed uniquely for your baby's needs.   Breast milk improves your baby's brain development.   Your baby is less likely to develop other conditions, such as childhood obesity, asthma, or type 2 diabetes mellitus.  For You   Breastfeeding helps to create a very special bond between you and your baby.   Breastfeeding is convenient. Breast milk is always available at the correct temperature and costs nothing.   Breastfeeding helps to burn calories and helps you lose the weight gained during pregnancy.   Breastfeeding makes your uterus contract to its  prepregnancy size faster and slows bleeding (lochia) after you give birth.   Breastfeeding helps to lower your risk of developing type 2 diabetes mellitus, osteoporosis, and breast or ovarian cancer later in life. SIGNS THAT YOUR BABY IS HUNGRY Early Signs of Hunger  Increased alertness or activity.  Stretching.  Movement of the head from side to side.  Movement of the head and opening of the mouth when the corner of the mouth or cheek is stroked (rooting).  Increased sucking sounds, smacking lips, cooing, sighing, or squeaking.  Hand-to-mouth movements.  Increased sucking of fingers or hands. Late Signs of Hunger  Fussing.  Intermittent crying. Extreme Signs of Hunger Signs of extreme hunger will require calming and consoling before your baby will be able to breastfeed successfully. Do not wait for the following signs of extreme hunger to occur before you initiate breastfeeding:   Restlessness.  A loud, strong cry.   Screaming. BREASTFEEDING BASICS Breastfeeding Initiation  Find a comfortable place to sit or lie down, with your neck and back well supported.  Place a pillow or rolled up blanket under your baby to bring him or her to the level of your breast (if you are seated). Nursing pillows are specially designed to help support your arms and your baby while you breastfeed.  Make sure that your baby's abdomen is facing your abdomen.   Gently massage your breast. With your fingertips, massage from your chest wall toward your nipple in a circular motion. This encourages milk flow. You may need to continue this action during the feeding if your milk flows slowly.  Support your breast with 4 fingers underneath and your thumb above your nipple. Make sure your fingers are well away from your nipple and your baby's mouth.   Stroke your baby's lips gently with your finger or nipple.   When your baby's mouth is open wide enough, quickly bring your baby to your breast,  placing your entire nipple and as much of the colored area around your nipple (areola) as possible into your baby's mouth.   More areola should be visible above your baby's upper lip than below the lower lip.   Your baby's tongue should be between his or her lower gum and your breast.   Ensure that your baby's mouth is correctly positioned around your nipple (latched). Your baby's lips should create a seal on your breast and be turned out (everted).  It is common for your baby to suck about 2-3 minutes in order to start the flow of breast milk. Latching Teaching your baby how to latch on to your breast   properly is very important. An improper latch can cause nipple pain and decreased milk supply for you and poor weight gain in your baby. Also, if your baby is not latched onto your nipple properly, he or she may swallow some air during feeding. This can make your baby fussy. Burping your baby when you switch breasts during the feeding can help to get rid of the air. However, teaching your baby to latch on properly is still the best way to prevent fussiness from swallowing air while breastfeeding. Signs that your baby has successfully latched on to your nipple:    Silent tugging or silent sucking, without causing you pain.   Swallowing heard between every 3-4 sucks.    Muscle movement above and in front of his or her ears while sucking.  Signs that your baby has not successfully latched on to nipple:   Sucking sounds or smacking sounds from your baby while breastfeeding.  Nipple pain. If you think your baby has not latched on correctly, slip your finger into the corner of your baby's mouth to break the suction and place it between your baby's gums. Attempt breastfeeding initiation again. Signs of Successful Breastfeeding Signs from your baby:   A gradual decrease in the number of sucks or complete cessation of sucking.   Falling asleep.   Relaxation of his or her body.    Retention of a small amount of milk in his or her mouth.   Letting go of your breast by himself or herself. Signs from you:  Breasts that have increased in firmness, weight, and size 1-3 hours after feeding.   Breasts that are softer immediately after breastfeeding.  Increased milk volume, as well as a change in milk consistency and color by the fifth day of breastfeeding.   Nipples that are not sore, cracked, or bleeding. Signs That Your Baby is Getting Enough Milk  Wetting at least 3 diapers in a 24-hour period. The urine should be clear and pale yellow by age 5 days.  At least 3 stools in a 24-hour period by age 5 days. The stool should be soft and yellow.  At least 3 stools in a 24-hour period by age 7 days. The stool should be seedy and yellow.  No loss of weight greater than 10% of birth weight during the first 3 days of age.  Average weight gain of 4-7 ounces (113-198 g) per week after age 4 days.  Consistent daily weight gain by age 5 days, without weight loss after the age of 2 weeks. After a feeding, your baby may spit up a small amount. This is common. BREASTFEEDING FREQUENCY AND DURATION Frequent feeding will help you make more milk and can prevent sore nipples and breast engorgement. Breastfeed when you feel the need to reduce the fullness of your breasts or when your baby shows signs of hunger. This is called "breastfeeding on demand." Avoid introducing a pacifier to your baby while you are working to establish breastfeeding (the first 4-6 weeks after your baby is born). After this time you may choose to use a pacifier. Research has shown that pacifier use during the first year of a baby's life decreases the risk of sudden infant death syndrome (SIDS). Allow your baby to feed on each breast as long as he or she wants. Breastfeed until your baby is finished feeding. When your baby unlatches or falls asleep while feeding from the first breast, offer the second breast.  Because newborns are often sleepy in the   first few weeks of life, you may need to awaken your baby to get him or her to feed. Breastfeeding times will vary from baby to baby. However, the following rules can serve as a guide to help you ensure that your baby is properly fed:  Newborns (babies 4 weeks of age or younger) may breastfeed every 1-3 hours.  Newborns should not go longer than 3 hours during the day or 5 hours during the night without breastfeeding.  You should breastfeed your baby a minimum of 8 times in a 24-hour period until you begin to introduce solid foods to your baby at around 6 months of age. BREAST MILK PUMPING Pumping and storing breast milk allows you to ensure that your baby is exclusively fed your breast milk, even at times when you are unable to breastfeed. This is especially important if you are going back to work while you are still breastfeeding or when you are not able to be present during feedings. Your lactation consultant can give you guidelines on how long it is safe to store breast milk.  A breast pump is a machine that allows you to pump milk from your breast into a sterile bottle. The pumped breast milk can then be stored in a refrigerator or freezer. Some breast pumps are operated by hand, while others use electricity. Ask your lactation consultant which type will work best for you. Breast pumps can be purchased, but some hospitals and breastfeeding support groups lease breast pumps on a monthly basis. A lactation consultant can teach you how to hand express breast milk, if you prefer not to use a pump.  CARING FOR YOUR BREASTS WHILE YOU BREASTFEED Nipples can become dry, cracked, and sore while breastfeeding. The following recommendations can help keep your breasts moisturized and healthy:  Avoid using soap on your nipples.   Wear a supportive bra. Although not required, special nursing bras and tank tops are designed to allow access to your breasts for  breastfeeding without taking off your entire bra or top. Avoid wearing underwire-style bras or extremely tight bras.  Air dry your nipples for 3-4minutes after each feeding.   Use only cotton bra pads to absorb leaked breast milk. Leaking of breast milk between feedings is normal.   Use lanolin on your nipples after breastfeeding. Lanolin helps to maintain your skin's normal moisture barrier. If you use pure lanolin, you do not need to wash it off before feeding your baby again. Pure lanolin is not toxic to your baby. You may also hand express a few drops of breast milk and gently massage that milk into your nipples and allow the milk to air dry. In the first few weeks after giving birth, some women experience extremely full breasts (engorgement). Engorgement can make your breasts feel heavy, warm, and tender to the touch. Engorgement peaks within 3-5 days after you give birth. The following recommendations can help ease engorgement:  Completely empty your breasts while breastfeeding or pumping. You may want to start by applying warm, moist heat (in the shower or with warm water-soaked hand towels) just before feeding or pumping. This increases circulation and helps the milk flow. If your baby does not completely empty your breasts while breastfeeding, pump any extra milk after he or she is finished.  Wear a snug bra (nursing or regular) or tank top for 1-2 days to signal your body to slightly decrease milk production.  Apply ice packs to your breasts, unless this is too uncomfortable for you.    Make sure that your baby is latched on and positioned properly while breastfeeding. If engorgement persists after 48 hours of following these recommendations, contact your health care provider or a lactation consultant. OVERALL HEALTH CARE RECOMMENDATIONS WHILE BREASTFEEDING  Eat healthy foods. Alternate between meals and snacks, eating 3 of each per day. Because what you eat affects your breast milk,  some of the foods may make your baby more irritable than usual. Avoid eating these foods if you are sure that they are negatively affecting your baby.  Drink milk, fruit juice, and water to satisfy your thirst (about 10 glasses a day).   Rest often, relax, and continue to take your prenatal vitamins to prevent fatigue, stress, and anemia.  Continue breast self-awareness checks.  Avoid chewing and smoking tobacco.  Avoid alcohol and drug use. Some medicines that may be harmful to your baby can pass through breast milk. It is important to ask your health care provider before taking any medicine, including all over-the-counter and prescription medicine as well as vitamin and herbal supplements. It is possible to become pregnant while breastfeeding. If birth control is desired, ask your health care provider about options that will be safe for your baby. SEEK MEDICAL CARE IF:   You feel like you want to stop breastfeeding or have become frustrated with breastfeeding.  You have painful breasts or nipples.  Your nipples are cracked or bleeding.  Your breasts are red, tender, or warm.  You have a swollen area on either breast.  You have a fever or chills.  You have nausea or vomiting.  You have drainage other than breast milk from your nipples.  Your breasts do not become full before feedings by the fifth day after you give birth.  You feel sad and depressed.  Your baby is too sleepy to eat well.  Your baby is having trouble sleeping.   Your baby is wetting less than 3 diapers in a 24-hour period.  Your baby has less than 3 stools in a 24-hour period.  Your baby's skin or the white part of his or her eyes becomes yellow.   Your baby is not gaining weight by 5 days of age. SEEK IMMEDIATE MEDICAL CARE IF:   Your baby is overly tired (lethargic) and does not want to wake up and feed.  Your baby develops an unexplained fever. Document Released: 09/06/2005 Document Revised:  09/11/2013 Document Reviewed: 02/28/2013 [employer] Patient Information 2015 [employer]. This information is not intended to replace advice given to you by your health care provider. Make sure you discuss any questions you have with your health care provider.  

## 2014-03-29 NOTE — Progress Notes (Signed)
Doing well Awaiting Medicaid--if does not get and cannot get first screen, will attempt quad screen.

## 2014-04-26 ENCOUNTER — Ambulatory Visit (INDEPENDENT_AMBULATORY_CARE_PROVIDER_SITE_OTHER): Payer: Medicaid Other | Admitting: Obstetrics and Gynecology

## 2014-04-26 ENCOUNTER — Encounter: Payer: Self-pay | Admitting: Obstetrics and Gynecology

## 2014-04-26 VITALS — BP 118/61 | HR 77 | Wt 217.0 lb

## 2014-04-26 DIAGNOSIS — Z3400 Encounter for supervision of normal first pregnancy, unspecified trimester: Secondary | ICD-10-CM

## 2014-04-26 DIAGNOSIS — Z3402 Encounter for supervision of normal first pregnancy, second trimester: Secondary | ICD-10-CM

## 2014-04-26 DIAGNOSIS — E669 Obesity, unspecified: Secondary | ICD-10-CM

## 2014-04-26 DIAGNOSIS — O360990 Maternal care for other rhesus isoimmunization, unspecified trimester, not applicable or unspecified: Secondary | ICD-10-CM

## 2014-04-26 DIAGNOSIS — G43109 Migraine with aura, not intractable, without status migrainosus: Secondary | ICD-10-CM

## 2014-04-26 DIAGNOSIS — O360121 Maternal care for anti-D [Rh] antibodies, second trimester, fetus 1: Secondary | ICD-10-CM

## 2014-04-26 DIAGNOSIS — O3090 Multiple gestation, unspecified, unspecified trimester: Secondary | ICD-10-CM

## 2014-04-26 NOTE — Progress Notes (Signed)
Patient is doing well without complaints. Anatomy ultrasound ordered. Quad screen ordered

## 2014-04-29 LAB — ALPHA FETOPROTEIN, MATERNAL
AFP: 18.7 IU/mL
Curr Gest Age: 16.3 wks.days
MOM FOR AFP: 0.74
OPEN SPINA BIFIDA: NEGATIVE
Osb Risk: <1:27300 {titer}

## 2014-05-16 ENCOUNTER — Other Ambulatory Visit: Payer: Self-pay | Admitting: Obstetrics and Gynecology

## 2014-05-16 ENCOUNTER — Ambulatory Visit (HOSPITAL_COMMUNITY)
Admission: RE | Admit: 2014-05-16 | Discharge: 2014-05-16 | Disposition: A | Discharge status: Home / Self Care | Payer: Medicaid Other | Source: Ambulatory Visit | Attending: Obstetrics and Gynecology | Admitting: Obstetrics and Gynecology

## 2014-05-16 DIAGNOSIS — Z1389 Encounter for screening for other disorder: Secondary | ICD-10-CM

## 2014-05-16 DIAGNOSIS — Z3689 Encounter for other specified antenatal screening: Secondary | ICD-10-CM | POA: Insufficient documentation

## 2014-05-16 DIAGNOSIS — Z3402 Encounter for supervision of normal first pregnancy, second trimester: Secondary | ICD-10-CM

## 2014-05-21 ENCOUNTER — Encounter: Payer: Self-pay | Admitting: Obstetrics and Gynecology

## 2014-05-24 ENCOUNTER — Ambulatory Visit (INDEPENDENT_AMBULATORY_CARE_PROVIDER_SITE_OTHER): Payer: Medicaid Other | Admitting: Obstetrics and Gynecology

## 2014-05-24 ENCOUNTER — Encounter: Payer: Self-pay | Admitting: Obstetrics and Gynecology

## 2014-05-24 VITALS — BP 116/76 | HR 87 | Wt 217.0 lb

## 2014-05-24 DIAGNOSIS — Z3402 Encounter for supervision of normal first pregnancy, second trimester: Secondary | ICD-10-CM

## 2014-05-24 DIAGNOSIS — Z3400 Encounter for supervision of normal first pregnancy, unspecified trimester: Secondary | ICD-10-CM

## 2014-05-24 DIAGNOSIS — O3090 Multiple gestation, unspecified, unspecified trimester: Secondary | ICD-10-CM

## 2014-05-24 DIAGNOSIS — O360990 Maternal care for other rhesus isoimmunization, unspecified trimester, not applicable or unspecified: Secondary | ICD-10-CM

## 2014-05-24 DIAGNOSIS — O360121 Maternal care for anti-D [Rh] antibodies, second trimester, fetus 1: Secondary | ICD-10-CM

## 2014-05-24 NOTE — Progress Notes (Signed)
Patient is doing well without complaints. Anatomy ultrasound results reviewed with the patient and patient desires follow up

## 2014-06-21 ENCOUNTER — Encounter: Payer: Self-pay | Admitting: Obstetrics & Gynecology

## 2014-06-21 ENCOUNTER — Ambulatory Visit (INDEPENDENT_AMBULATORY_CARE_PROVIDER_SITE_OTHER): Payer: Medicaid Other | Admitting: Obstetrics & Gynecology

## 2014-06-21 VITALS — BP 116/78 | HR 85 | Wt 222.0 lb

## 2014-06-21 DIAGNOSIS — Z3402 Encounter for supervision of normal first pregnancy, second trimester: Secondary | ICD-10-CM

## 2014-06-21 DIAGNOSIS — Z23 Encounter for immunization: Secondary | ICD-10-CM

## 2014-06-21 NOTE — Progress Notes (Signed)
Routine visit. Good FM. No VB, ROM, CTXs. Flu vaccine today. Glucola, labs, rhophylac, TDAP at next visit.

## 2014-06-21 NOTE — Addendum Note (Signed)
Addended by: Allie BossierVE, Hoang Reich C on: 06/21/2014 09:29 AM   Modules accepted: Orders

## 2014-07-04 ENCOUNTER — Ambulatory Visit (HOSPITAL_COMMUNITY)
Admission: RE | Admit: 2014-07-04 | Discharge: 2014-07-04 | Disposition: A | Discharge status: Home / Self Care | Payer: Medicaid Other | Source: Ambulatory Visit | Attending: Obstetrics & Gynecology | Admitting: Obstetrics & Gynecology

## 2014-07-04 DIAGNOSIS — O99212 Obesity complicating pregnancy, second trimester: Secondary | ICD-10-CM

## 2014-07-04 DIAGNOSIS — Z3402 Encounter for supervision of normal first pregnancy, second trimester: Secondary | ICD-10-CM

## 2014-07-04 DIAGNOSIS — E669 Obesity, unspecified: Secondary | ICD-10-CM

## 2014-07-04 DIAGNOSIS — Z3A26 26 weeks gestation of pregnancy: Secondary | ICD-10-CM | POA: Diagnosis not present

## 2014-07-04 DIAGNOSIS — Z36 Encounter for antenatal screening of mother: Secondary | ICD-10-CM | POA: Insufficient documentation

## 2014-07-18 ENCOUNTER — Ambulatory Visit (INDEPENDENT_AMBULATORY_CARE_PROVIDER_SITE_OTHER): Payer: Medicaid Other | Admitting: Obstetrics and Gynecology

## 2014-07-18 ENCOUNTER — Encounter: Payer: Self-pay | Admitting: Obstetrics and Gynecology

## 2014-07-18 VITALS — BP 126/81 | HR 95 | Wt 229.0 lb

## 2014-07-18 DIAGNOSIS — Z23 Encounter for immunization: Secondary | ICD-10-CM

## 2014-07-18 DIAGNOSIS — O360131 Maternal care for anti-D [Rh] antibodies, third trimester, fetus 1: Secondary | ICD-10-CM

## 2014-07-18 DIAGNOSIS — E669 Obesity, unspecified: Secondary | ICD-10-CM

## 2014-07-18 DIAGNOSIS — Z3403 Encounter for supervision of normal first pregnancy, third trimester: Secondary | ICD-10-CM

## 2014-07-18 DIAGNOSIS — G43109 Migraine with aura, not intractable, without status migrainosus: Secondary | ICD-10-CM

## 2014-07-18 MED ORDER — RHO D IMMUNE GLOBULIN 1500 UNIT/2ML IJ SOSY
300.0000 ug | PREFILLED_SYRINGE | Freq: Once | INTRAMUSCULAR | Status: AC
Start: 2014-07-18 — End: 2014-07-18
  Administered 2014-07-18: 300 ug via INTRAMUSCULAR

## 2014-07-18 NOTE — Patient Instructions (Signed)
Contraception Choices Contraception (birth control) is the use of any methods or devices to prevent pregnancy. Below are some methods to help avoid pregnancy. HORMONAL METHODS   Contraceptive implant. This is a thin, plastic tube containing progesterone hormone. It does not contain estrogen hormone. Your health care provider inserts the tube in the inner part of the upper arm. The tube can remain in place for up to 3 years. After 3 years, the implant must be removed. The implant prevents the ovaries from releasing an egg (ovulation), thickens the cervical mucus to prevent sperm from entering the uterus, and thins the lining of the inside of the uterus.  Progesterone-only injections. These injections are given every 3 months by your health care provider to prevent pregnancy. This synthetic progesterone hormone stops the ovaries from releasing eggs. It also thickens cervical mucus and changes the uterine lining. This makes it harder for sperm to survive in the uterus.  Birth control pills. These pills contain estrogen and progesterone hormone. They work by preventing the ovaries from releasing eggs (ovulation). They also cause the cervical mucus to thicken, preventing the sperm from entering the uterus. Birth control pills are prescribed by a health care provider.Birth control pills can also be used to treat heavy periods.  Minipill. This type of birth control pill contains only the progesterone hormone. They are taken every day of each month and must be prescribed by your health care provider.  Birth control patch. The patch contains hormones similar to those in birth control pills. It must be changed once a week and is prescribed by a health care provider.  Vaginal ring. The ring contains hormones similar to those in birth control pills. It is left in the vagina for 3 weeks, removed for 1 week, and then a new one is put back in place. The patient must be comfortable inserting and removing the ring  from the vagina.A health care provider's prescription is necessary.  Emergency contraception. Emergency contraceptives prevent pregnancy after unprotected sexual intercourse. This pill can be taken right after sex or up to 5 days after unprotected sex. It is most effective the sooner you take the pills after having sexual intercourse. Most emergency contraceptive pills are available without a prescription. Check with your pharmacist. Do not use emergency contraception as your only form of birth control. BARRIER METHODS   Female condom. This is a thin sheath (latex or rubber) that is worn over the penis during sexual intercourse. It can be used with spermicide to increase effectiveness.  Female condom. This is a soft, loose-fitting sheath that is put into the vagina before sexual intercourse.  Diaphragm. This is a soft, latex, dome-shaped barrier that must be fitted by a health care provider. It is inserted into the vagina, along with a spermicidal jelly. It is inserted before intercourse. The diaphragm should be left in the vagina for 6 to 8 hours after intercourse.  Cervical cap. This is a round, soft, latex or plastic cup that fits over the cervix and must be fitted by a health care provider. The cap can be left in place for up to 48 hours after intercourse.  Sponge. This is a soft, circular piece of polyurethane foam. The sponge has spermicide in it. It is inserted into the vagina after wetting it and before sexual intercourse.  Spermicides. These are chemicals that kill or block sperm from entering the cervix and uterus. They come in the form of creams, jellies, suppositories, foam, or tablets. They do not require a   prescription. They are inserted into the vagina with an applicator before having sexual intercourse. The process must be repeated every time you have sexual intercourse. INTRAUTERINE CONTRACEPTION  Intrauterine device (IUD). This is a T-shaped device that is put in a woman's uterus  during a menstrual period to prevent pregnancy. There are 2 types:  Copper IUD. This type of IUD is wrapped in copper wire and is placed inside the uterus. Copper makes the uterus and fallopian tubes produce a fluid that kills sperm. It can stay in place for 10 years.  Hormone IUD. This type of IUD contains the hormone progestin (synthetic progesterone). The hormone thickens the cervical mucus and prevents sperm from entering the uterus, and it also thins the uterine lining to prevent implantation of a fertilized egg. The hormone can weaken or kill the sperm that get into the uterus. It can stay in place for 3-5 years, depending on which type of IUD is used. PERMANENT METHODS OF CONTRACEPTION  Female tubal ligation. This is when the woman's fallopian tubes are surgically sealed, tied, or blocked to prevent the egg from traveling to the uterus.  Hysteroscopic sterilization. This involves placing a small coil or insert into each fallopian tube. Your doctor uses a technique called hysteroscopy to do the procedure. The device causes scar tissue to form. This results in permanent blockage of the fallopian tubes, so the sperm cannot fertilize the egg. It takes about 3 months after the procedure for the tubes to become blocked. You must use another form of birth control for these 3 months.  Female sterilization. This is when the female has the tubes that carry sperm tied off (vasectomy).This blocks sperm from entering the vagina during sexual intercourse. After the procedure, the man can still ejaculate fluid (semen). NATURAL PLANNING METHODS  Natural family planning. This is not having sexual intercourse or using a barrier method (condom, diaphragm, cervical cap) on days the woman could become pregnant.  Calendar method. This is keeping track of the length of each menstrual cycle and identifying when you are fertile.  Ovulation method. This is avoiding sexual intercourse during ovulation.  Symptothermal  method. This is avoiding sexual intercourse during ovulation, using a thermometer and ovulation symptoms.  Post-ovulation method. This is timing sexual intercourse after you have ovulated. Regardless of which type or method of contraception you choose, it is important that you use condoms to protect against the transmission of sexually transmitted infections (STIs). Talk with your health care provider about which form of contraception is most appropriate for you. Document Released: 09/06/2005 Document Revised: 09/11/2013 Document Reviewed: 03/01/2013 [employer] Patient Information 2015 [employer]. This information is not intended to replace advice given to you by your health care provider. Make sure you discuss any questions you have with your health care provider.  

## 2014-07-18 NOTE — Progress Notes (Signed)
Patient is doing well without complaints. 1ht GCT, labs and rhogam today. FM/PTL precautions reviewed

## 2014-07-19 LAB — CBC
HEMATOCRIT: 38.6 % (ref 36.0–46.0)
HEMOGLOBIN: 13.2 g/dL (ref 12.0–15.0)
MCH: 30.6 pg (ref 26.0–34.0)
MCHC: 34.2 g/dL (ref 30.0–36.0)
MCV: 89.4 fL (ref 78.0–100.0)
Platelets: 261 10*3/uL (ref 150–400)
RBC: 4.32 MIL/uL (ref 3.87–5.11)
RDW: 13.3 % (ref 11.5–15.5)
WBC: 10.4 10*3/uL (ref 4.0–10.5)

## 2014-07-19 LAB — HIV ANTIBODY (ROUTINE TESTING W REFLEX): HIV: NONREACTIVE

## 2014-07-19 LAB — RPR

## 2014-07-19 LAB — ANTIBODY SCREEN: Antibody Screen: NEGATIVE

## 2014-07-22 ENCOUNTER — Encounter: Payer: Self-pay | Admitting: Obstetrics and Gynecology

## 2014-07-23 LAB — GLUCOSE TOLERANCE, 1 HOUR (50G) W/O FASTING: Glucose, 1 Hour GTT: 99 mg/dL (ref 70–140)

## 2014-08-02 ENCOUNTER — Ambulatory Visit (INDEPENDENT_AMBULATORY_CARE_PROVIDER_SITE_OTHER): Payer: Medicaid Other | Admitting: Family Medicine

## 2014-08-02 ENCOUNTER — Encounter: Payer: Self-pay | Admitting: Family Medicine

## 2014-08-02 VITALS — BP 132/86 | HR 100 | Wt 234.0 lb

## 2014-08-02 DIAGNOSIS — Z3403 Encounter for supervision of normal first pregnancy, third trimester: Secondary | ICD-10-CM

## 2014-08-02 MED ORDER — COMPLETENATE 29-1 MG PO CHEW: 1.0000 | CHEWABLE_TABLET | Freq: Every day | ORAL | Status: DC

## 2014-08-02 NOTE — Progress Notes (Signed)
Doing well No problems 28 wk labs normal

## 2014-08-02 NOTE — Patient Instructions (Addendum)
Having a circumcision done in the hospital costs approximately $480.  This will have to be paid in full prior to circumcision being performed.  There are places to have circumcision done as an outpatient which are cheaper.    Circumcisions      Provider   Phone    Price     ------------------------------------------------------------------------------   [facility]  [phone]  $480 by 4 wks     [facility]   [phone]  $[phone] by 4 wks     [facility]   [phone]  $175 by 2 wks    [facility]   [phone]  $250 by 7 days [facility]   [phone]  $150 by 4 wks  Third Trimester of Pregnancy The third trimester is from week 29 through week 42, months 7 through 9. The third trimester is a time when the fetus is growing rapidly. At the end of the ninth month, the fetus is about 20 inches in length and weighs 6-10 pounds.  BODY CHANGES Your body goes through many changes during pregnancy. The changes vary from woman to woman.   Your weight will continue to increase. You can expect to gain 25-35 pounds (11-16 kg) by the end of the pregnancy.  You may begin to get stretch marks on your hips, abdomen, and breasts.  You may urinate more often because the fetus is moving lower into your pelvis and pressing on your bladder.  You may develop or continue to have heartburn as a result of your pregnancy.  You may develop constipation because certain hormones are causing the muscles that push waste through your intestines to slow down.  You may develop hemorrhoids or swollen, bulging veins (varicose veins).  You may have pelvic pain because of the weight gain and pregnancy hormones relaxing your joints between the bones in your pelvis. Backaches may result from overexertion of the muscles supporting your posture.  You may have changes in your hair. These can include thickening of your hair, rapid growth, and changes in texture. Some women also have hair loss during or after pregnancy, or hair that feels dry or thin. Your  hair will most likely return to normal after your baby is born.  Your breasts will continue to grow and be tender. A yellow discharge may leak from your breasts called colostrum.  Your belly button may stick out.  You may feel short of breath because of your expanding uterus.  You may notice the fetus "dropping," or moving lower in your abdomen.  You may have a bloody mucus discharge. This usually occurs a few days to a week before labor begins.  Your cervix becomes thin and soft (effaced) near your due date. WHAT TO EXPECT AT YOUR PRENATAL EXAMS  You will have prenatal exams every 2 weeks until week 36. Then, you will have weekly prenatal exams. During a routine prenatal visit:  You will be weighed to make sure you and the fetus are growing normally.  Your blood pressure is taken.  Your abdomen will be measured to track your baby's growth.  The fetal heartbeat will be listened to.  Any test results from the previous visit will be discussed.  You may have a cervical check near your due date to see if you have effaced. At around 36 weeks, your caregiver will check your cervix. At the same time, your caregiver will also perform a test on the secretions of the vaginal tissue. This test is to determine if a type of bacteria, Group B   streptococcus, is present. Your caregiver will explain this further. Your caregiver may ask you:  What your birth plan is.  How you are feeling.  If you are feeling the baby move.  If you have had any abnormal symptoms, such as leaking fluid, bleeding, severe headaches, or abdominal cramping.  If you have any questions. Other tests or screenings that may be performed during your third trimester include:  Blood tests that check for low iron levels (anemia).  Fetal testing to check the health, activity level, and growth of the fetus. Testing is done if you have certain medical conditions or if there are problems during the pregnancy. FALSE LABOR You  may feel small, irregular contractions that eventually go away. These are called Braxton Hicks contractions, or false labor. Contractions may last for hours, days, or even weeks before true labor sets in. If contractions come at regular intervals, intensify, or become painful, it is best to be seen by your caregiver.  SIGNS OF LABOR   Menstrual-like cramps.  Contractions that are 5 minutes apart or less.  Contractions that start on the top of the uterus and spread down to the lower abdomen and back.  A sense of increased pelvic pressure or back pain.  A watery or bloody mucus discharge that comes from the vagina. If you have any of these signs before the 37th week of pregnancy, call your caregiver right away. You need to go to the hospital to get checked immediately. HOME CARE INSTRUCTIONS   Avoid all smoking, herbs, alcohol, and unprescribed drugs. These chemicals affect the formation and growth of the baby.  Follow your caregiver's instructions regarding medicine use. There are medicines that are either safe or unsafe to take during pregnancy.  Exercise only as directed by your caregiver. Experiencing uterine cramps is a good sign to stop exercising.  Continue to eat regular, healthy meals.  Wear a good support bra for breast tenderness.  Do not use hot tubs, steam rooms, or saunas.  Wear your seat belt at all times when driving.  Avoid raw meat, uncooked cheese, cat litter boxes, and soil used by cats. These carry germs that can cause birth defects in the baby.  Take your prenatal vitamins.  Try taking a stool softener (if your caregiver approves) if you develop constipation. Eat more high-fiber foods, such as fresh vegetables or fruit and whole grains. Drink plenty of fluids to keep your urine clear or pale yellow.  Take warm sitz baths to soothe any pain or discomfort caused by hemorrhoids. Use hemorrhoid cream if your caregiver approves.  If you develop varicose veins, wear  support hose. Elevate your feet for 15 minutes, 3-4 times a day. Limit salt in your diet.  Avoid heavy lifting, wear low heal shoes, and practice good posture.  Rest a lot with your legs elevated if you have leg cramps or low back pain.  Visit your dentist if you have not gone during your pregnancy. Use a soft toothbrush to brush your teeth and be gentle when you floss.  A sexual relationship may be continued unless your caregiver directs you otherwise.  Do not travel far distances unless it is absolutely necessary and only with the approval of your caregiver.  Take prenatal classes to understand, practice, and ask questions about the labor and delivery.  Make a trial run to the hospital.  Pack your hospital bag.  Prepare the baby's nursery.  Continue to go to all your prenatal visits as directed by your caregiver.   SEEK MEDICAL CARE IF:  You are unsure if you are in labor or if your water has broken.  You have dizziness.  You have mild pelvic cramps, pelvic pressure, or nagging pain in your abdominal area.  You have persistent nausea, vomiting, or diarrhea.  You have a bad smelling vaginal discharge.  You have pain with urination. SEEK IMMEDIATE MEDICAL CARE IF:   You have a fever.  You are leaking fluid from your vagina.  You have spotting or bleeding from your vagina.  You have severe abdominal cramping or pain.  You have rapid weight loss or gain.  You have shortness of breath with chest pain.  You notice sudden or extreme swelling of your face, hands, ankles, feet, or legs.  You have not felt your baby move in over an hour.  You have severe headaches that do not go away with medicine.  You have vision changes. Document Released: 08/31/2001 Document Revised: 09/11/2013 Document Reviewed: 11/07/2012 [employer] Patient Information 2015 [employer]. This information is not intended to replace advice given to you by your health care provider. Make sure you  discuss any questions you have with your health care provider.  Breastfeeding Deciding to breastfeed is one of the best choices you can make for you and your baby. A change in hormones during pregnancy causes your breast tissue to grow and increases the number and size of your milk ducts. These hormones also allow proteins, sugars, and fats from your blood supply to make breast milk in your milk-producing glands. Hormones prevent breast milk from being released before your baby is born as well as prompt milk flow after birth. Once breastfeeding has begun, thoughts of your baby, as well as his or her sucking or crying, can stimulate the release of milk from your milk-producing glands.  BENEFITS OF BREASTFEEDING For Your Baby  Your first milk (colostrum) helps your baby's digestive system function better.   There are antibodies in your milk that help your baby fight off infections.   Your baby has a lower incidence of asthma, allergies, and sudden infant death syndrome.   The nutrients in breast milk are better for your baby than infant formulas and are designed uniquely for your baby's needs.   Breast milk improves your baby's brain development.   Your baby is less likely to develop other conditions, such as childhood obesity, asthma, or type 2 diabetes mellitus.  For You   Breastfeeding helps to create a very special bond between you and your baby.   Breastfeeding is convenient. Breast milk is always available at the correct temperature and costs nothing.   Breastfeeding helps to burn calories and helps you lose the weight gained during pregnancy.   Breastfeeding makes your uterus contract to its prepregnancy size faster and slows bleeding (lochia) after you give birth.   Breastfeeding helps to lower your risk of developing type 2 diabetes mellitus, osteoporosis, and breast or ovarian cancer later in life. SIGNS THAT YOUR BABY IS HUNGRY Early Signs of Hunger  Increased  alertness or activity.  Stretching.  Movement of the head from side to side.  Movement of the head and opening of the mouth when the corner of the mouth or cheek is stroked (rooting).  Increased sucking sounds, smacking lips, cooing, sighing, or squeaking.  Hand-to-mouth movements.  Increased sucking of fingers or hands. Late Signs of Hunger  Fussing.  Intermittent crying. Extreme Signs of Hunger Signs of extreme hunger will require calming and consoling before your baby   will be able to breastfeed successfully. Do not wait for the following signs of extreme hunger to occur before you initiate breastfeeding:   Restlessness.  A loud, strong cry.   Screaming. BREASTFEEDING BASICS Breastfeeding Initiation  Find a comfortable place to sit or lie down, with your neck and back well supported.  Place a pillow or rolled up blanket under your baby to bring him or her to the level of your breast (if you are seated). Nursing pillows are specially designed to help support your arms and your baby while you breastfeed.  Make sure that your baby's abdomen is facing your abdomen.   Gently massage your breast. With your fingertips, massage from your chest wall toward your nipple in a circular motion. This encourages milk flow. You may need to continue this action during the feeding if your milk flows slowly.  Support your breast with 4 fingers underneath and your thumb above your nipple. Make sure your fingers are well away from your nipple and your baby's mouth.   Stroke your baby's lips gently with your finger or nipple.   When your baby's mouth is open wide enough, quickly bring your baby to your breast, placing your entire nipple and as much of the colored area around your nipple (areola) as possible into your baby's mouth.   More areola should be visible above your baby's upper lip than below the lower lip.   Your baby's tongue should be between his or her lower gum and your  breast.   Ensure that your baby's mouth is correctly positioned around your nipple (latched). Your baby's lips should create a seal on your breast and be turned out (everted).  It is common for your baby to suck about 2-3 minutes in order to start the flow of breast milk. Latching Teaching your baby how to latch on to your breast properly is very important. An improper latch can cause nipple pain and decreased milk supply for you and poor weight gain in your baby. Also, if your baby is not latched onto your nipple properly, he or she may swallow some air during feeding. This can make your baby fussy. Burping your baby when you switch breasts during the feeding can help to get rid of the air. However, teaching your baby to latch on properly is still the best way to prevent fussiness from swallowing air while breastfeeding. Signs that your baby has successfully latched on to your nipple:    Silent tugging or silent sucking, without causing you pain.   Swallowing heard between every 3-4 sucks.    Muscle movement above and in front of his or her ears while sucking.  Signs that your baby has not successfully latched on to nipple:   Sucking sounds or smacking sounds from your baby while breastfeeding.  Nipple pain. If you think your baby has not latched on correctly, slip your finger into the corner of your baby's mouth to break the suction and place it between your baby's gums. Attempt breastfeeding initiation again. Signs of Successful Breastfeeding Signs from your baby:   A gradual decrease in the number of sucks or complete cessation of sucking.   Falling asleep.   Relaxation of his or her body.   Retention of a small amount of milk in his or her mouth.   Letting go of your breast by himself or herself. Signs from you:  Breasts that have increased in firmness, weight, and size 1-3 hours after feeding.   Breasts that are   softer immediately after breastfeeding.  Increased  milk volume, as well as a change in milk consistency and color by the fifth day of breastfeeding.   Nipples that are not sore, cracked, or bleeding. Signs That Your Baby is Getting Enough Milk  Wetting at least 3 diapers in a 24-hour period. The urine should be clear and pale yellow by age 5 days.  At least 3 stools in a 24-hour period by age 5 days. The stool should be soft and yellow.  At least 3 stools in a 24-hour period by age 7 days. The stool should be seedy and yellow.  No loss of weight greater than 10% of birth weight during the first 3 days of age.  Average weight gain of 4-7 ounces (113-198 g) per week after age 4 days.  Consistent daily weight gain by age 5 days, without weight loss after the age of 2 weeks. After a feeding, your baby may spit up a small amount. This is common. BREASTFEEDING FREQUENCY AND DURATION Frequent feeding will help you make more milk and can prevent sore nipples and breast engorgement. Breastfeed when you feel the need to reduce the fullness of your breasts or when your baby shows signs of hunger. This is called "breastfeeding on demand." Avoid introducing a pacifier to your baby while you are working to establish breastfeeding (the first 4-6 weeks after your baby is born). After this time you may choose to use a pacifier. Research has shown that pacifier use during the first year of a baby's life decreases the risk of sudden infant death syndrome (SIDS). Allow your baby to feed on each breast as long as he or she wants. Breastfeed until your baby is finished feeding. When your baby unlatches or falls asleep while feeding from the first breast, offer the second breast. Because newborns are often sleepy in the first few weeks of life, you may need to awaken your baby to get him or her to feed. Breastfeeding times will vary from baby to baby. However, the following rules can serve as a guide to help you ensure that your baby is properly fed:  Newborns  (babies 4 weeks of age or younger) may breastfeed every 1-3 hours.  Newborns should not go longer than 3 hours during the day or 5 hours during the night without breastfeeding.  You should breastfeed your baby a minimum of 8 times in a 24-hour period until you begin to introduce solid foods to your baby at around 6 months of age. BREAST MILK PUMPING Pumping and storing breast milk allows you to ensure that your baby is exclusively fed your breast milk, even at times when you are unable to breastfeed. This is especially important if you are going back to work while you are still breastfeeding or when you are not able to be present during feedings. Your lactation consultant can give you guidelines on how long it is safe to store breast milk.  A breast pump is a machine that allows you to pump milk from your breast into a sterile bottle. The pumped breast milk can then be stored in a refrigerator or freezer. Some breast pumps are operated by hand, while others use electricity. Ask your lactation consultant which type will work best for you. Breast pumps can be purchased, but some hospitals and breastfeeding support groups lease breast pumps on a monthly basis. A lactation consultant can teach you how to hand express breast milk, if you prefer not to use a pump.    CARING FOR YOUR BREASTS WHILE YOU BREASTFEED Nipples can become dry, cracked, and sore while breastfeeding. The following recommendations can help keep your breasts moisturized and healthy:  Avoid using soap on your nipples.   Wear a supportive bra. Although not required, special nursing bras and tank tops are designed to allow access to your breasts for breastfeeding without taking off your entire bra or top. Avoid wearing underwire-style bras or extremely tight bras.  Air dry your nipples for 3-4minutes after each feeding.   Use only cotton bra pads to absorb leaked breast milk. Leaking of breast milk between feedings is normal.   Use  lanolin on your nipples after breastfeeding. Lanolin helps to maintain your skin's normal moisture barrier. If you use pure lanolin, you do not need to wash it off before feeding your baby again. Pure lanolin is not toxic to your baby. You may also hand express a few drops of breast milk and gently massage that milk into your nipples and allow the milk to air dry. In the first few weeks after giving birth, some women experience extremely full breasts (engorgement). Engorgement can make your breasts feel heavy, warm, and tender to the touch. Engorgement peaks within 3-5 days after you give birth. The following recommendations can help ease engorgement:  Completely empty your breasts while breastfeeding or pumping. You may want to start by applying warm, moist heat (in the shower or with warm water-soaked hand towels) just before feeding or pumping. This increases circulation and helps the milk flow. If your baby does not completely empty your breasts while breastfeeding, pump any extra milk after he or she is finished.  Wear a snug bra (nursing or regular) or tank top for 1-2 days to signal your body to slightly decrease milk production.  Apply ice packs to your breasts, unless this is too uncomfortable for you.  Make sure that your baby is latched on and positioned properly while breastfeeding. If engorgement persists after 48 hours of following these recommendations, contact your health care provider or a lactation consultant. OVERALL HEALTH CARE RECOMMENDATIONS WHILE BREASTFEEDING  Eat healthy foods. Alternate between meals and snacks, eating 3 of each per day. Because what you eat affects your breast milk, some of the foods may make your baby more irritable than usual. Avoid eating these foods if you are sure that they are negatively affecting your baby.  Drink milk, fruit juice, and water to satisfy your thirst (about 10 glasses a day).   Rest often, relax, and continue to take your prenatal  vitamins to prevent fatigue, stress, and anemia.  Continue breast self-awareness checks.  Avoid chewing and smoking tobacco.  Avoid alcohol and drug use. Some medicines that may be harmful to your baby can pass through breast milk. It is important to ask your health care provider before taking any medicine, including all over-the-counter and prescription medicine as well as vitamin and herbal supplements. It is possible to become pregnant while breastfeeding. If birth control is desired, ask your health care provider about options that will be safe for your baby. SEEK MEDICAL CARE IF:   You feel like you want to stop breastfeeding or have become frustrated with breastfeeding.  You have painful breasts or nipples.  Your nipples are cracked or bleeding.  Your breasts are red, tender, or warm.  You have a swollen area on either breast.  You have a fever or chills.  You have nausea or vomiting.  You have drainage other than breast milk from   your nipples.  Your breasts do not become full before feedings by the fifth day after you give birth.  You feel sad and depressed.  Your baby is too sleepy to eat well.  Your baby is having trouble sleeping.   Your baby is wetting less than 3 diapers in a 24-hour period.  Your baby has less than 3 stools in a 24-hour period.  Your baby's skin or the white part of his or her eyes becomes yellow.   Your baby is not gaining weight by 5 days of age. SEEK IMMEDIATE MEDICAL CARE IF:   Your baby is overly tired (lethargic) and does not want to wake up and feed.  Your baby develops an unexplained fever. Document Released: 09/06/2005 Document Revised: 09/11/2013 Document Reviewed: 02/28/2013 [employer] Patient Information 2015 [employer]. This information is not intended to replace advice given to you by your health care provider. Make sure you discuss any questions you have with your health care provider.  

## 2014-08-14 ENCOUNTER — Other Ambulatory Visit: Payer: Self-pay | Admitting: Family Medicine

## 2014-08-14 ENCOUNTER — Encounter (HOSPITAL_COMMUNITY): Payer: Self-pay

## 2014-08-14 ENCOUNTER — Ambulatory Visit (HOSPITAL_COMMUNITY)
Admission: RE | Admit: 2014-08-14 | Discharge: 2014-08-14 | Disposition: A | Discharge status: Home / Self Care | Payer: Medicaid Other | Source: Ambulatory Visit | Attending: Family Medicine | Admitting: Family Medicine

## 2014-08-14 DIAGNOSIS — Z3403 Encounter for supervision of normal first pregnancy, third trimester: Secondary | ICD-10-CM | POA: Diagnosis not present

## 2014-08-14 DIAGNOSIS — IMO0002 Reserved for concepts with insufficient information to code with codable children: Secondary | ICD-10-CM | POA: Insufficient documentation

## 2014-08-14 DIAGNOSIS — Z3689 Encounter for other specified antenatal screening: Secondary | ICD-10-CM

## 2014-08-14 DIAGNOSIS — Z3A00 Weeks of gestation of pregnancy not specified: Secondary | ICD-10-CM | POA: Diagnosis not present

## 2014-08-14 DIAGNOSIS — O99213 Obesity complicating pregnancy, third trimester: Secondary | ICD-10-CM

## 2014-08-14 DIAGNOSIS — Z3A32 32 weeks gestation of pregnancy: Secondary | ICD-10-CM | POA: Insufficient documentation

## 2014-08-20 ENCOUNTER — Encounter: Payer: Self-pay | Admitting: Obstetrics & Gynecology

## 2014-08-20 ENCOUNTER — Ambulatory Visit (INDEPENDENT_AMBULATORY_CARE_PROVIDER_SITE_OTHER): Payer: Medicaid Other | Admitting: Obstetrics & Gynecology

## 2014-08-20 VITALS — BP 107/78 | HR 99 | Wt 236.8 lb

## 2014-08-20 DIAGNOSIS — Z3403 Encounter for supervision of normal first pregnancy, third trimester: Secondary | ICD-10-CM

## 2014-08-20 NOTE — Progress Notes (Signed)
Routine visit. Good FM."Very active". No VB, ROM, CTXs. MFM rec's a 4th u/[facility] Reassurance given.

## 2014-09-06 ENCOUNTER — Encounter: Payer: Self-pay | Admitting: Obstetrics & Gynecology

## 2014-09-06 ENCOUNTER — Ambulatory Visit (INDEPENDENT_AMBULATORY_CARE_PROVIDER_SITE_OTHER): Payer: Medicaid Other | Admitting: Obstetrics & Gynecology

## 2014-09-06 VITALS — BP 126/91 | HR 95 | Wt 247.0 lb

## 2014-09-06 DIAGNOSIS — O365931 Maternal care for other known or suspected poor fetal growth, third trimester, fetus 1: Secondary | ICD-10-CM

## 2014-09-06 DIAGNOSIS — Z3403 Encounter for supervision of normal first pregnancy, third trimester: Secondary | ICD-10-CM

## 2014-09-06 NOTE — Progress Notes (Signed)
Pt with no complaints.  +FM, No ctx, no LOF.

## 2014-09-06 NOTE — Patient Instructions (Addendum)
Third Trimester of Pregnancy The third trimester is from week 29 through week 42, months 7 through 9. The third trimester is a time when the fetus is growing rapidly. At the end of the ninth month, the fetus is about 20 inches in length and weighs 6-10 pounds.  BODY CHANGES Your body goes through many changes during pregnancy. The changes vary from woman to woman.   Your weight will continue to increase. You can expect to gain 25-35 pounds (11-16 kg) by the end of the pregnancy.  You may begin to get stretch marks on your hips, abdomen, and breasts.  You may urinate more often because the fetus is moving lower into your pelvis and pressing on your bladder.  You may develop or continue to have heartburn as a result of your pregnancy.  You may develop constipation because certain hormones are causing the muscles that push waste through your intestines to slow down.  You may develop hemorrhoids or swollen, bulging veins (varicose veins).  You may have pelvic pain because of the weight gain and pregnancy hormones relaxing your joints between the bones in your pelvis. Backaches may result from overexertion of the muscles supporting your posture.  You may have changes in your hair. These can include thickening of your hair, rapid growth, and changes in texture. Some women also have hair loss during or after pregnancy, or hair that feels dry or thin. Your hair will most likely return to normal after your baby is born.  Your breasts will continue to grow and be tender. A yellow discharge may leak from your breasts called colostrum.  Your belly button may stick out.  You may feel short of breath because of your expanding uterus.  You may notice the fetus "dropping," or moving lower in your abdomen.  You may have a bloody mucus discharge. This usually occurs a few days to a week before labor begins.  Your cervix becomes thin and soft (effaced) near your due date. WHAT TO EXPECT AT YOUR PRENATAL  EXAMS  You will have prenatal exams every 2 weeks until week 36. Then, you will have weekly prenatal exams. During a routine prenatal visit:  You will be weighed to make sure you and the fetus are growing normally.  Your blood pressure is taken.  Your abdomen will be measured to track your baby's growth.  The fetal heartbeat will be listened to.  Any test results from the previous visit will be discussed.  You may have a cervical check near your due date to see if you have effaced. At around 36 weeks, your caregiver will check your cervix. At the same time, your caregiver will also perform a test on the secretions of the vaginal tissue. This test is to determine if a type of bacteria, Group B streptococcus, is present. Your caregiver will explain this further. Your caregiver may ask you:  What your birth plan is.  How you are feeling.  If you are feeling the baby move.  If you have had any abnormal symptoms, such as leaking fluid, bleeding, severe headaches, or abdominal cramping.  If you have any questions. Other tests or screenings that may be performed during your third trimester include:  Blood tests that check for low iron levels (anemia).  Fetal testing to check the health, activity level, and growth of the fetus. Testing is done if you have certain medical conditions or if there are problems during the pregnancy. FALSE LABOR You may feel small, irregular contractions that   eventually go away. These are called Braxton Hicks contractions, or false labor. Contractions may last for hours, days, or even weeks before true labor sets in. If contractions come at regular intervals, intensify, or become painful, it is best to be seen by your caregiver.  SIGNS OF LABOR   Menstrual-like cramps.  Contractions that are 5 minutes apart or less.  Contractions that start on the top of the uterus and spread down to the lower abdomen and back.  A sense of increased pelvic pressure or back  pain.  A watery or bloody mucus discharge that comes from the vagina. If you have any of these signs before the 37th week of pregnancy, call your caregiver right away. You need to go to the hospital to get checked immediately. HOME CARE INSTRUCTIONS   Avoid all smoking, herbs, alcohol, and unprescribed drugs. These chemicals affect the formation and growth of the baby.  Follow your caregiver's instructions regarding medicine use. There are medicines that are either safe or unsafe to take during pregnancy.  Exercise only as directed by your caregiver. Experiencing uterine cramps is a good sign to stop exercising.  Continue to eat regular, healthy meals.  Wear a good support bra for breast tenderness.  Do not use hot tubs, steam rooms, or saunas.  Wear your seat belt at all times when driving.  Avoid raw meat, uncooked cheese, cat litter boxes, and soil used by cats. These carry germs that can cause birth defects in the baby.  Take your prenatal vitamins.  Try taking a stool softener (if your caregiver approves) if you develop constipation. Eat more high-fiber foods, such as fresh vegetables or fruit and whole grains. Drink plenty of fluids to keep your urine clear or pale yellow.  Take warm sitz baths to soothe any pain or discomfort caused by hemorrhoids. Use hemorrhoid cream if your caregiver approves.  If you develop varicose veins, wear support hose. Elevate your feet for 15 minutes, 3-4 times a day. Limit salt in your diet.  Avoid heavy lifting, wear low heal shoes, and practice good posture.  Rest a lot with your legs elevated if you have leg cramps or low back pain.  Visit your dentist if you have not gone during your pregnancy. Use a soft toothbrush to brush your teeth and be gentle when you floss.  A sexual relationship may be continued unless your caregiver directs you otherwise.  Do not travel far distances unless it is absolutely necessary and only with the approval  of your caregiver.  Take prenatal classes to understand, practice, and ask questions about the labor and delivery.  Make a trial run to the hospital.  Pack your hospital bag.  Prepare the baby's nursery.  Continue to go to all your prenatal visits as directed by your caregiver. SEEK MEDICAL CARE IF:  You are unsure if you are in labor or if your water has broken.  You have dizziness.  You have mild pelvic cramps, pelvic pressure, or nagging pain in your abdominal area.  You have persistent nausea, vomiting, or diarrhea.  You have a bad smelling vaginal discharge.  You have pain with urination. SEEK IMMEDIATE MEDICAL CARE IF:   You have a fever.  You are leaking fluid from your vagina.  You have spotting or bleeding from your vagina.  You have severe abdominal cramping or pain.  You have rapid weight loss or gain.  You have shortness of breath with chest pain.  You notice sudden or extreme swelling   of your face, hands, ankles, feet, or legs.  You have not felt your baby move in over an hour.  You have severe headaches that do not go away with medicine.  You have vision changes. Document Released: 08/31/2001 Document Revised: 09/11/2013 Document Reviewed: 11/07/2012 ExitCare Patient Information 2015 [employer]. This information is not intended to replace advice given to you by your health care provider. Make sure you discuss any questions you have with your health care provider. Group B Streptococcus Infection During Pregnancy Group B streptococcus (GBS) is a type of bacteria often found in healthy women. GBS is not the same as the bacteria that causes strep throat. You may have GBS in your vagina, rectum, or bladder. GBS does not spread through sexual contact, but it can be passed to a baby during childbirth. This can be dangerous for your baby. It is not dangerous to you and usually does not cause any symptoms. Your health care provider may test you for GBS when  your pregnancy is between 35 and 37 weeks. GBS is dangerous only during birth, so there is no need to test for it earlier. It is possible to have GBS during pregnancy and never pass it to your baby. If your test results are positive for GBS, your health care provider may recommend giving you antibiotic medicine during delivery to make sure your baby stays healthy. RISK FACTORS You are more likely to pass GBS to your baby if:   Your water breaks (ruptured membrane) or you go into labor before 37 weeks.  Your water breaks 18 hours before you deliver.  You passed GBS during a previous pregnancy.  You have a urinary tract infection caused by GBS any time during pregnancy.  You have a fever during labor. SYMPTOMS Most women who have GBS do not have any symptoms. If you have a urinary tract infection caused by GBS, you might have frequent or painful urination and fever. Babies who get GBS usually show symptoms within 7 days of birth. Symptoms may include:   Breathing problems.  Heart and blood pressure problems.  Digestive and kidney problems. DIAGNOSIS Routine screening for GBS is recommended for all pregnant women. A health care provider takes a sample of the fluid in your vagina and rectum with a swab. It is then sent to a lab to be checked for GBS. A sample of your urine may also be checked for the bacteria.  TREATMENT If you test positive for GBS, you may need treatment with an antibiotic medicine during labor. As soon as you go into labor, or as soon as your membranes rupture, you will get the antibiotic medicine through an IV access. You will continue to get the medicine until after you give birth. You do not need antibiotic medicine if you are having a cesarean delivery.If your baby shows signs or symptoms of GBS after birth, your baby can also be treated with an antibiotic medicine. HOME CARE INSTRUCTIONS   Take all antibiotic medicine as prescribed by your health care provider. Only  take medicine as directed.   Continue with prenatal visits and care.   Keep all follow-up appointments.  SEEK MEDICAL CARE IF:   You have pain when you urinate.   You have to urinate frequently.   You have a fever.  SEEK IMMEDIATE MEDICAL CARE IF:   Your membranes rupture.  You go into labor. Document Released: 12/14/2007 Document Revised: 09/11/2013 Document Reviewed: 06/29/2013 ExitCare Patient Information 2015 [employer]. This information is not   intended to replace advice given to you by your health care provider. Make sure you discuss any questions you have with your health care provider.  

## 2014-09-11 ENCOUNTER — Ambulatory Visit (INDEPENDENT_AMBULATORY_CARE_PROVIDER_SITE_OTHER): Payer: Self-pay | Admitting: Physician Assistant

## 2014-09-11 ENCOUNTER — Ambulatory Visit (HOSPITAL_COMMUNITY)
Admission: RE | Admit: 2014-09-11 | Discharge: 2014-09-11 | Disposition: A | Discharge status: Home / Self Care | Payer: Medicaid Other | Source: Ambulatory Visit | Attending: Family Medicine | Admitting: Family Medicine

## 2014-09-11 ENCOUNTER — Encounter (HOSPITAL_COMMUNITY): Payer: Self-pay

## 2014-09-11 VITALS — BP 127/82 | HR 90 | Wt 247.8 lb

## 2014-09-11 DIAGNOSIS — O321XX0 Maternal care for breech presentation, not applicable or unspecified: Secondary | ICD-10-CM | POA: Insufficient documentation

## 2014-09-11 DIAGNOSIS — O99210 Obesity complicating pregnancy, unspecified trimester: Secondary | ICD-10-CM | POA: Insufficient documentation

## 2014-09-11 DIAGNOSIS — E669 Obesity, unspecified: Secondary | ICD-10-CM | POA: Diagnosis not present

## 2014-09-11 DIAGNOSIS — O365930 Maternal care for other known or suspected poor fetal growth, third trimester, not applicable or unspecified: Secondary | ICD-10-CM | POA: Insufficient documentation

## 2014-09-11 DIAGNOSIS — Z3493 Encounter for supervision of normal pregnancy, unspecified, third trimester: Secondary | ICD-10-CM

## 2014-09-11 DIAGNOSIS — Z3483 Encounter for supervision of other normal pregnancy, third trimester: Secondary | ICD-10-CM

## 2014-09-11 DIAGNOSIS — Z36 Encounter for antenatal screening of mother: Secondary | ICD-10-CM | POA: Diagnosis not present

## 2014-09-11 DIAGNOSIS — O99213 Obesity complicating pregnancy, third trimester: Secondary | ICD-10-CM | POA: Diagnosis not present

## 2014-09-11 DIAGNOSIS — Z3A36 36 weeks gestation of pregnancy: Secondary | ICD-10-CM | POA: Insufficient documentation

## 2014-09-11 DIAGNOSIS — Z3403 Encounter for supervision of normal first pregnancy, third trimester: Secondary | ICD-10-CM

## 2014-09-11 DIAGNOSIS — Z3689 Encounter for other specified antenatal screening: Secondary | ICD-10-CM

## 2014-09-11 NOTE — Patient Instructions (Signed)
Cesarean Delivery  Cesarean delivery is the birth of a baby through a cut (incision) in the abdomen and womb (uterus).  LET YOUR HEALTH CARE PROVIDER KNOW ABOUT:  All medicines you are taking, including vitamins, herbs, eye drops, creams, and over-the-counter medicines.  Previous problems you or members of your family have had with the use of anesthetics.  Any blood disorders you have.  Previous surgeries you have had.  Medical conditions you have.  Any allergies you have.  Complicationsinvolving the pregnancy. RISKS AND COMPLICATIONS  Generally, this is a safe procedure. However, as with any procedure, complications can occur. Possible complications include:  Bleeding.  Infection.  Blood clots.  Injury to surrounding organs.  Problems with anesthesia.  Injury to the baby. BEFORE THE PROCEDURE   You may be given an antacid medicine to drink. This will prevent acid contents in your stomach from going into your lungs if you vomit during the surgery.  You may be given an antibiotic medicine to prevent infection. PROCEDURE   Hair may be removed from your pubic area and your lower abdomen. This is to prevent infection in the incision site.  A tube (Foley catheter) will be placed in your bladder to drain your urine from your bladder into a bag. This keeps your bladder empty during surgery.  An IV tube will be placed in your vein.  You may be given medicine to numb the lower half of your body (regional anesthetic). If you were in labor, you may have already had an epidural in place which can be used in both labor and cesarean delivery. You may possibly be given medicine to make you sleep (general anesthetic) though this is not as common.  An incision will be made in your abdomen that extends to your uterus. There are 2 basic kinds of incisions:  The horizontal (transverse) incision. Horizontal incisions are from side to side and are used for most routine cesarean  deliveries.  The vertical incision. The vertical incision is from the top of the abdomen to the bottom and is less commonly used. It is often done for women who have a serious complication (extreme prematurity) or under emergency situations.  The horizontal and vertical incisions may both be used at the same time. However, this is very uncommon.  An incision is then made in your uterus to deliver the baby.  Your baby will then be delivered.  Both incisions are then closed with absorbable stitches. AFTER THE PROCEDURE   If you were awake during the surgery, you will see your baby right away. If you were asleep, you will see your baby as soon as you are awake.  You may breastfeed your baby after surgery.  You may be able to get up and walk the same day as the surgery. If you need to stay in bed for a period of time, you will receive help to turn, cough, and take deep breaths after surgery. This helps prevent lung problems such as pneumonia.  Do not get out of bed alone the first time after surgery. You will need help getting out of bed until you are able to do this by yourself.  You may be able to shower the day after your cesarean delivery. After the bandage (dressing) is taken off the incision site, a nurse will assist you to shower if you would like help.  You will have pneumatic compression hose placed on your lower legs. This is done to prevent blood clots. When you are up   and walking regularly, they will no longer be necessary.  Do not cross your legs when you sit.  Save any blood clots that you pass. If you pass a clot while on the toilet, do not flush it. Call for the nurse. Tell the nurse if you think you are bleeding too much or passing too many clots.  You will be given medicine as needed. Let your health care providers know if you are hurting. You may also be given an antibiotic to prevent an infection.  Your IV tube will be taken out when you are drinking a reasonable  amount of fluids. The Foley catheter is taken out when you are up and walking.  If your blood type is Rh negative and your baby's blood type is Rh positive, you will be given a shot of anti-D immune globulin. This shot prevents you from having Rh problems with a future pregnancy. You should get the shot even if you had your tubes tied (tubal ligation).  If you are allowed to take the baby for a walk, place the baby in the bassinet and push it. Do not carry your baby in your arms. Document Released: 09/06/2005 Document Revised: 06/27/2013 Document Reviewed: 03/28/2013 ExitCare Patient Information 2015 [employer]. This information is not intended to replace advice given to you by your health care provider. Make sure you discuss any questions you have with your health care provider.  

## 2014-09-11 NOTE — Progress Notes (Signed)
36 weeks, no complaints.  +FM, no LOF/vag bleeding/dysuria. U/S was this morning and presentation determined to be breech.   Options discussed and pt not interested in ECV at this time.  She desires to be scheduled for csection.  Labor precautions discussed GBS pending RTC 1 week

## 2014-09-12 LAB — GC/CHLAMYDIA PROBE AMP
CT PROBE, AMP APTIMA: NEGATIVE
GC Probe RNA: NEGATIVE

## 2014-09-13 LAB — CULTURE, BETA STREP (GROUP B ONLY)

## 2014-09-17 ENCOUNTER — Ambulatory Visit (INDEPENDENT_AMBULATORY_CARE_PROVIDER_SITE_OTHER): Payer: Medicaid Other | Admitting: Obstetrics & Gynecology

## 2014-09-17 VITALS — BP 127/91 | HR 87 | Wt 246.0 lb

## 2014-09-17 DIAGNOSIS — Z3403 Encounter for supervision of normal first pregnancy, third trimester: Secondary | ICD-10-CM

## 2014-09-17 NOTE — Progress Notes (Signed)
Routine visit. Good FM. No problems.  

## 2014-09-23 ENCOUNTER — Other Ambulatory Visit: Payer: Self-pay | Admitting: Obstetrics & Gynecology

## 2014-09-27 ENCOUNTER — Encounter: Payer: Self-pay | Admitting: Obstetrics & Gynecology

## 2014-09-27 ENCOUNTER — Ambulatory Visit (INDEPENDENT_AMBULATORY_CARE_PROVIDER_SITE_OTHER): Payer: Medicaid Other | Admitting: Obstetrics & Gynecology

## 2014-09-27 VITALS — BP 129/88 | HR 94 | Wt 250.0 lb

## 2014-09-27 DIAGNOSIS — O09293 Supervision of pregnancy with other poor reproductive or obstetric history, third trimester: Secondary | ICD-10-CM

## 2014-09-27 DIAGNOSIS — O321XX1 Maternal care for breech presentation, fetus 1: Secondary | ICD-10-CM

## 2014-09-27 DIAGNOSIS — Z3403 Encounter for supervision of normal first pregnancy, third trimester: Secondary | ICD-10-CM

## 2014-09-27 NOTE — Progress Notes (Signed)
Pt with no complaints.  Passed mucus plug. Breech confirmed with [place]

## 2014-09-27 NOTE — Patient Instructions (Addendum)
   Your procedure is scheduled on: Tuesday, Jan 12  [facility] at: 7:45 AM Pick up the phone at the desk and dial [phone] and inform us of your arrival.  Please call this number if you have any problems the morning of surgery: [phone]  Remember: Do not eat or d rink after midnight: Monday Take these medicines the morning of surgery with a SIP OF WATER: None  Do not wear jewelry, make-up, or FINGER nail polish No metal in your hair or on your body. Do not wear lotions, powders, perfumes.  You may wear deodorant.  Do not bring valuables to the hospital. Contacts, dentures or bridgework may not be worn into surgery.  Leave suitcase in the car. After Surgery it may be brought to your room. For patients being admitted to the hospital, checkout time is 11:00am the day of discharge.  Home with husband Emily Payne cell [phone]

## 2014-09-27 NOTE — Patient Instructions (Signed)
Cesarean Delivery  Cesarean delivery is the birth of a baby through a cut (incision) in the abdomen and womb (uterus).  LET YOUR HEALTH CARE PROVIDER KNOW ABOUT:  All medicines you are taking, including vitamins, herbs, eye drops, creams, and over-the-counter medicines.  Previous problems you or members of your family have had with the use of anesthetics.  Any blood disorders you have.  Previous surgeries you have had.  Medical conditions you have.  Any allergies you have.  Complicationsinvolving the pregnancy. RISKS AND COMPLICATIONS  Generally, this is a safe procedure. However, as with any procedure, complications can occur. Possible complications include:  Bleeding.  Infection.  Blood clots.  Injury to surrounding organs.  Problems with anesthesia.  Injury to the baby. BEFORE THE PROCEDURE   You may be given an antacid medicine to drink. This will prevent acid contents in your stomach from going into your lungs if you vomit during the surgery.  You may be given an antibiotic medicine to prevent infection. PROCEDURE   Hair may be removed from your pubic area and your lower abdomen. This is to prevent infection in the incision site.  A tube (Foley catheter) will be placed in your bladder to drain your urine from your bladder into a bag. This keeps your bladder empty during surgery.  An IV tube will be placed in your vein.  You may be given medicine to numb the lower half of your body (regional anesthetic). If you were in labor, you may have already had an epidural in place which can be used in both labor and cesarean delivery. You may possibly be given medicine to make you sleep (general anesthetic) though this is not as common.  An incision will be made in your abdomen that extends to your uterus. There are 2 basic kinds of incisions:  The horizontal (transverse) incision. Horizontal incisions are from side to side and are used for most routine cesarean  deliveries.  The vertical incision. The vertical incision is from the top of the abdomen to the bottom and is less commonly used. It is often done for women who have a serious complication (extreme prematurity) or under emergency situations.  The horizontal and vertical incisions may both be used at the same time. However, this is very uncommon.  An incision is then made in your uterus to deliver the baby.  Your baby will then be delivered.  Both incisions are then closed with absorbable stitches. AFTER THE PROCEDURE   If you were awake during the surgery, you will see your baby right away. If you were asleep, you will see your baby as soon as you are awake.  You may breastfeed your baby after surgery.  You may be able to get up and walk the same day as the surgery. If you need to stay in bed for a period of time, you will receive help to turn, cough, and take deep breaths after surgery. This helps prevent lung problems such as pneumonia.  Do not get out of bed alone the first time after surgery. You will need help getting out of bed until you are able to do this by yourself.  You may be able to shower the day after your cesarean delivery. After the bandage (dressing) is taken off the incision site, a nurse will assist you to shower if you would like help.  You will have pneumatic compression hose placed on your lower legs. This is done to prevent blood clots. When you are up   and walking regularly, they will no longer be necessary.  Do not cross your legs when you sit.  Save any blood clots that you pass. If you pass a clot while on the toilet, do not flush it. Call for the nurse. Tell the nurse if you think you are bleeding too much or passing too many clots.  You will be given medicine as needed. Let your health care providers know if you are hurting. You may also be given an antibiotic to prevent an infection.  Your IV tube will be taken out when you are drinking a reasonable  amount of fluids. The Foley catheter is taken out when you are up and walking.  If your blood type is Rh negative and your baby's blood type is Rh positive, you will be given a shot of anti-D immune globulin. This shot prevents you from having Rh problems with a future pregnancy. You should get the shot even if you had your tubes tied (tubal ligation).  If you are allowed to take the baby for a walk, place the baby in the bassinet and push it. Do not carry your baby in your arms. Document Released: 09/06/2005 Document Revised: 06/27/2013 Document Reviewed: 03/28/2013 ExitCare Patient Information 2015 [employer]. This information is not intended to replace advice given to you by your health care provider. Make sure you discuss any questions you have with your health care provider.  

## 2014-09-30 ENCOUNTER — Encounter (HOSPITAL_COMMUNITY): Payer: Self-pay

## 2014-09-30 ENCOUNTER — Encounter (HOSPITAL_COMMUNITY)
Admission: RE | Admit: 2014-09-30 | Discharge: 2014-09-30 | Disposition: A | Discharge status: Home / Self Care | Payer: Medicaid Other | Source: Ambulatory Visit | Attending: Obstetrics & Gynecology | Admitting: Obstetrics & Gynecology

## 2014-09-30 HISTORY — DX: Major depressive disorder, single episode, unspecified: F32.9

## 2014-09-30 HISTORY — DX: Bipolar disorder, unspecified: F31.9

## 2014-09-30 HISTORY — DX: Depression, unspecified: F32.A

## 2014-09-30 HISTORY — DX: Anxiety disorder, unspecified: F41.9

## 2014-09-30 LAB — CBC
HCT: 40.3 % (ref 36.0–46.0)
Hemoglobin: 13.6 g/dL (ref 12.0–15.0)
MCH: 30.2 pg (ref 26.0–34.0)
MCHC: 33.7 g/dL (ref 30.0–36.0)
MCV: 89.4 fL (ref 78.0–100.0)
PLATELETS: 234 10*3/uL (ref 150–400)
RBC: 4.51 MIL/uL (ref 3.87–5.11)
RDW: 13.2 % (ref 11.5–15.5)
WBC: 10.8 10*3/uL — ABNORMAL HIGH (ref 4.0–10.5)

## 2014-10-01 ENCOUNTER — Inpatient Hospital Stay (HOSPITAL_COMMUNITY): Payer: Medicaid Other | Admitting: Anesthesiology

## 2014-10-01 ENCOUNTER — Encounter (HOSPITAL_COMMUNITY)
Admission: AD | Disposition: A | Discharge status: Home / Self Care | Payer: Self-pay | Source: Ambulatory Visit | Attending: Obstetrics & Gynecology

## 2014-10-01 ENCOUNTER — Encounter (HOSPITAL_COMMUNITY): Payer: Self-pay | Admitting: *Deleted

## 2014-10-01 ENCOUNTER — Inpatient Hospital Stay (HOSPITAL_COMMUNITY)
Admission: AD | Admit: 2014-10-01 | Discharge: 2014-10-03 | DRG: 766 | Disposition: A | Discharge status: Home / Self Care | Payer: Medicaid Other | Source: Ambulatory Visit | Attending: Obstetrics & Gynecology | Admitting: Obstetrics & Gynecology

## 2014-10-01 DIAGNOSIS — Z3A39 39 weeks gestation of pregnancy: Secondary | ICD-10-CM | POA: Diagnosis present

## 2014-10-01 DIAGNOSIS — O321XX1 Maternal care for breech presentation, fetus 1: Secondary | ICD-10-CM

## 2014-10-01 DIAGNOSIS — O321XX0 Maternal care for breech presentation, not applicable or unspecified: Principal | ICD-10-CM | POA: Diagnosis present

## 2014-10-01 LAB — RPR: RPR Ser Ql: NONREACTIVE

## 2014-10-01 SURGERY — Surgical Case
Anesthesia: Spinal

## 2014-10-01 MED ORDER — MIDAZOLAM HCL 2 MG/2ML IJ SOLN
INTRAMUSCULAR | Status: AC
  Filled 2014-10-01: qty 2

## 2014-10-01 MED ORDER — ONDANSETRON HCL 4 MG/2ML IJ SOLN: 4.0000 mg | Freq: Three times a day (TID) | INTRAMUSCULAR | Status: DC | PRN

## 2014-10-01 MED ORDER — ZOLPIDEM TARTRATE 5 MG PO TABS: 5.0000 mg | ORAL_TABLET | Freq: Every evening | ORAL | Status: DC | PRN

## 2014-10-01 MED ORDER — ONDANSETRON HCL 4 MG/2ML IJ SOLN
INTRAMUSCULAR | Status: AC
  Filled 2014-10-01: qty 2

## 2014-10-01 MED ORDER — SIMETHICONE 80 MG PO CHEW
80.0000 mg | CHEWABLE_TABLET | ORAL | Status: DC | PRN
  Administered 2014-10-02: 80 mg via ORAL

## 2014-10-01 MED ORDER — NALBUPHINE HCL 10 MG/ML IJ SOLN: 5.0000 mg | Freq: Once | INTRAMUSCULAR | Status: AC | PRN

## 2014-10-01 MED ORDER — FENTANYL CITRATE 0.05 MG/ML IJ SOLN
INTRAMUSCULAR | Status: DC | PRN
  Administered 2014-10-01: 25 ug via INTRATHECAL

## 2014-10-01 MED ORDER — IBUPROFEN 600 MG PO TABS
600.0000 mg | ORAL_TABLET | Freq: Four times a day (QID) | ORAL | Status: DC
  Administered 2014-10-02 – 2014-10-03 (×6): 600 mg via ORAL
  Filled 2014-10-01 (×6): qty 1

## 2014-10-01 MED ORDER — PROMETHAZINE HCL 25 MG/ML IJ SOLN: 6.2500 mg | INTRAMUSCULAR | Status: DC | PRN

## 2014-10-01 MED ORDER — KETOROLAC TROMETHAMINE 30 MG/ML IJ SOLN
30.0000 mg | Freq: Four times a day (QID) | INTRAMUSCULAR | Status: AC | PRN
  Administered 2014-10-01: 30 mg via INTRAVENOUS
  Filled 2014-10-01: qty 1

## 2014-10-01 MED ORDER — IBUPROFEN 600 MG PO TABS: 600.0000 mg | ORAL_TABLET | Freq: Four times a day (QID) | ORAL | Status: DC | PRN

## 2014-10-01 MED ORDER — CEFAZOLIN SODIUM-DEXTROSE 2-3 GM-% IV SOLR
INTRAVENOUS | Status: AC
Start: 2014-10-01 — End: 2014-10-01
  Filled 2014-10-01: qty 50

## 2014-10-01 MED ORDER — KETOROLAC TROMETHAMINE 30 MG/ML IJ SOLN: 30.0000 mg | Freq: Four times a day (QID) | INTRAMUSCULAR | Status: AC | PRN

## 2014-10-01 MED ORDER — BUPIVACAINE HCL (PF) 0.5 % IJ SOLN
INTRAMUSCULAR | Status: DC | PRN
  Administered 2014-10-01: 30 mL

## 2014-10-01 MED ORDER — KETOROLAC TROMETHAMINE 30 MG/ML IJ SOLN
INTRAMUSCULAR | Status: AC
  Filled 2014-10-01: qty 1

## 2014-10-01 MED ORDER — MIDAZOLAM HCL 2 MG/2ML IJ SOLN: 0.5000 mg | Freq: Once | INTRAMUSCULAR | Status: DC | PRN

## 2014-10-01 MED ORDER — BUPIVACAINE HCL (PF) 0.5 % IJ SOLN
INTRAMUSCULAR | Status: AC
  Filled 2014-10-01: qty 30

## 2014-10-01 MED ORDER — LACTATED RINGERS IV SOLN
Freq: Once | INTRAVENOUS | Status: AC
  Administered 2014-10-01: 08:00:00 via INTRAVENOUS

## 2014-10-01 MED ORDER — DIBUCAINE 1 % RE OINT: 1.0000 "application " | TOPICAL_OINTMENT | RECTAL | Status: DC | PRN

## 2014-10-01 MED ORDER — NALOXONE HCL 1 MG/ML IJ SOLN: 1.0000 ug/kg/h | INTRAVENOUS | Status: DC | PRN

## 2014-10-01 MED ORDER — ONDANSETRON HCL 4 MG/2ML IJ SOLN: 4.0000 mg | INTRAMUSCULAR | Status: DC | PRN

## 2014-10-01 MED ORDER — OXYCODONE-ACETAMINOPHEN 5-325 MG PO TABS: 2.0000 | ORAL_TABLET | ORAL | Status: DC | PRN

## 2014-10-01 MED ORDER — DEXAMETHASONE SODIUM PHOSPHATE 10 MG/ML IJ SOLN
INTRAMUSCULAR | Status: DC | PRN
  Administered 2014-10-01: 10 mg via INTRAVENOUS

## 2014-10-01 MED ORDER — NALBUPHINE HCL 10 MG/ML IJ SOLN: 5.0000 mg | INTRAMUSCULAR | Status: DC | PRN

## 2014-10-01 MED ORDER — PHENYLEPHRINE 8 MG IN D5W 100 ML (0.08MG/ML) PREMIX OPTIME
INJECTION | INTRAVENOUS | Status: DC | PRN
  Administered 2014-10-01: 60 ug/min via INTRAVENOUS

## 2014-10-01 MED ORDER — DEXAMETHASONE SODIUM PHOSPHATE 10 MG/ML IJ SOLN
INTRAMUSCULAR | Status: AC
  Filled 2014-10-01: qty 1

## 2014-10-01 MED ORDER — 0.9 % SODIUM CHLORIDE (POUR BTL) OPTIME
TOPICAL | Status: DC | PRN
  Administered 2014-10-01: 1000 mL

## 2014-10-01 MED ORDER — LIDOCAINE HCL (CARDIAC) 20 MG/ML IV SOLN
INTRAVENOUS | Status: AC
  Filled 2014-10-01: qty 5

## 2014-10-01 MED ORDER — FENTANYL CITRATE 0.05 MG/ML IJ SOLN
INTRAMUSCULAR | Status: AC
  Filled 2014-10-01: qty 2

## 2014-10-01 MED ORDER — LACTATED RINGERS IV SOLN
INTRAVENOUS | Status: DC
  Administered 2014-10-01 (×2): via INTRAVENOUS

## 2014-10-01 MED ORDER — SIMETHICONE 80 MG PO CHEW
80.0000 mg | CHEWABLE_TABLET | ORAL | Status: DC
  Administered 2014-10-01 – 2014-10-03 (×2): 80 mg via ORAL
  Filled 2014-10-01 (×2): qty 1

## 2014-10-01 MED ORDER — DIPHENHYDRAMINE HCL 25 MG PO CAPS: 25.0000 mg | ORAL_CAPSULE | Freq: Four times a day (QID) | ORAL | Status: DC | PRN

## 2014-10-01 MED ORDER — GLYCOPYRROLATE 0.2 MG/ML IJ SOLN
INTRAMUSCULAR | Status: DC | PRN
  Administered 2014-10-01: 0.2 mg via INTRAVENOUS

## 2014-10-01 MED ORDER — ONDANSETRON HCL 4 MG/2ML IJ SOLN
INTRAMUSCULAR | Status: DC | PRN
  Administered 2014-10-01: 4 mg via INTRAVENOUS

## 2014-10-01 MED ORDER — BUPIVACAINE IN DEXTROSE 0.75-8.25 % IT SOLN
INTRATHECAL | Status: DC | PRN
  Administered 2014-10-01: 2 mL via INTRATHECAL

## 2014-10-01 MED ORDER — SCOPOLAMINE 1 MG/3DAYS TD PT72
MEDICATED_PATCH | TRANSDERMAL | Status: AC
  Administered 2014-10-01: 1.5 mg via TRANSDERMAL
  Filled 2014-10-01: qty 1

## 2014-10-01 MED ORDER — NALOXONE HCL 0.4 MG/ML IJ SOLN: 0.4000 mg | INTRAMUSCULAR | Status: DC | PRN

## 2014-10-01 MED ORDER — ONDANSETRON HCL 4 MG PO TABS: 4.0000 mg | ORAL_TABLET | ORAL | Status: DC | PRN

## 2014-10-01 MED ORDER — MENTHOL 3 MG MT LOZG: 1.0000 | LOZENGE | OROMUCOSAL | Status: DC | PRN

## 2014-10-01 MED ORDER — ACETAMINOPHEN 500 MG PO TABS
1000.0000 mg | ORAL_TABLET | Freq: Four times a day (QID) | ORAL | Status: AC
  Administered 2014-10-01 – 2014-10-02 (×3): 1000 mg via ORAL
  Filled 2014-10-01 (×3): qty 2

## 2014-10-01 MED ORDER — KETOROLAC TROMETHAMINE 30 MG/ML IJ SOLN
INTRAMUSCULAR | Status: AC
  Administered 2014-10-01: 30 mg via INTRAVENOUS
  Filled 2014-10-01: qty 1

## 2014-10-01 MED ORDER — PROPOFOL 10 MG/ML IV BOLUS
INTRAVENOUS | Status: AC
  Filled 2014-10-01: qty 20

## 2014-10-01 MED ORDER — OXYTOCIN 10 UNIT/ML IJ SOLN
INTRAMUSCULAR | Status: AC
  Filled 2014-10-01: qty 3

## 2014-10-01 MED ORDER — PRENATAL MULTIVITAMIN CH
1.0000 | ORAL_TABLET | Freq: Every day | ORAL | Status: DC
  Administered 2014-10-02: 1 via ORAL
  Filled 2014-10-01: qty 1

## 2014-10-01 MED ORDER — PHENYLEPHRINE 40 MCG/ML (10ML) SYRINGE FOR IV PUSH (FOR BLOOD PRESSURE SUPPORT)
PREFILLED_SYRINGE | INTRAVENOUS | Status: AC
Start: 2014-10-01 — End: 2014-10-01
  Filled 2014-10-01: qty 10

## 2014-10-01 MED ORDER — SIMETHICONE 80 MG PO CHEW
80.0000 mg | CHEWABLE_TABLET | Freq: Three times a day (TID) | ORAL | Status: DC
  Administered 2014-10-01 – 2014-10-03 (×4): 80 mg via ORAL
  Filled 2014-10-01 (×5): qty 1

## 2014-10-01 MED ORDER — MORPHINE SULFATE 0.5 MG/ML IJ SOLN
INTRAMUSCULAR | Status: AC
  Filled 2014-10-01: qty 10

## 2014-10-01 MED ORDER — WITCH HAZEL-GLYCERIN EX PADS: 1.0000 "application " | MEDICATED_PAD | CUTANEOUS | Status: DC | PRN

## 2014-10-01 MED ORDER — DIPHENHYDRAMINE HCL 25 MG PO CAPS: 25.0000 mg | ORAL_CAPSULE | ORAL | Status: DC | PRN

## 2014-10-01 MED ORDER — LACTATED RINGERS IV SOLN
INTRAVENOUS | Status: DC
  Administered 2014-10-01 (×2): via INTRAVENOUS

## 2014-10-01 MED ORDER — OXYTOCIN 40 UNITS IN LACTATED RINGERS INFUSION - SIMPLE MED: 62.5000 mL/h | INTRAVENOUS | Status: AC

## 2014-10-01 MED ORDER — ACETAMINOPHEN 160 MG/5ML PO SOLN: 325.0000 mg | ORAL | Status: DC | PRN

## 2014-10-01 MED ORDER — LACTATED RINGERS IV SOLN: INTRAVENOUS | Status: DC

## 2014-10-01 MED ORDER — SENNOSIDES-DOCUSATE SODIUM 8.6-50 MG PO TABS
2.0000 | ORAL_TABLET | ORAL | Status: DC
  Administered 2014-10-01 – 2014-10-03 (×2): 2 via ORAL
  Filled 2014-10-01 (×2): qty 2

## 2014-10-01 MED ORDER — MEPERIDINE HCL 25 MG/ML IJ SOLN: 6.2500 mg | INTRAMUSCULAR | Status: DC | PRN

## 2014-10-01 MED ORDER — DIPHENHYDRAMINE HCL 50 MG/ML IJ SOLN: 12.5000 mg | INTRAMUSCULAR | Status: DC | PRN

## 2014-10-01 MED ORDER — TETANUS-DIPHTH-ACELL PERTUSSIS 5-2.5-18.5 LF-MCG/0.5 IM SUSP: 0.5000 mL | Freq: Once | INTRAMUSCULAR | Status: DC

## 2014-10-01 MED ORDER — LANOLIN HYDROUS EX OINT: 1.0000 "application " | TOPICAL_OINTMENT | CUTANEOUS | Status: DC | PRN

## 2014-10-01 MED ORDER — OXYCODONE-ACETAMINOPHEN 5-325 MG PO TABS
1.0000 | ORAL_TABLET | ORAL | Status: DC | PRN
  Administered 2014-10-02 – 2014-10-03 (×4): 1 via ORAL
  Filled 2014-10-01 (×4): qty 1

## 2014-10-01 MED ORDER — OXYTOCIN 10 UNIT/ML IJ SOLN
INTRAMUSCULAR | Status: AC
  Filled 2014-10-01: qty 1

## 2014-10-01 MED ORDER — KETOROLAC TROMETHAMINE 30 MG/ML IJ SOLN
30.0000 mg | Freq: Once | INTRAMUSCULAR | Status: AC | PRN
  Administered 2014-10-01: 30 mg via INTRAVENOUS

## 2014-10-01 MED ORDER — CEFAZOLIN SODIUM-DEXTROSE 2-3 GM-% IV SOLR
2.0000 g | INTRAVENOUS | Status: AC
  Administered 2014-10-01: 2 g via INTRAVENOUS

## 2014-10-01 MED ORDER — FENTANYL CITRATE 0.05 MG/ML IJ SOLN: 25.0000 ug | INTRAMUSCULAR | Status: DC | PRN

## 2014-10-01 MED ORDER — OXYTOCIN 10 UNIT/ML IJ SOLN
40.0000 [IU] | INTRAVENOUS | Status: DC | PRN
  Administered 2014-10-01: 40 [IU] via INTRAVENOUS

## 2014-10-01 MED ORDER — SCOPOLAMINE 1 MG/3DAYS TD PT72
1.0000 | MEDICATED_PATCH | Freq: Once | TRANSDERMAL | Status: AC
  Administered 2014-10-01: 1 via TRANSDERMAL
  Administered 2014-10-01: 1.5 mg via TRANSDERMAL

## 2014-10-01 MED ORDER — MORPHINE SULFATE (PF) 0.5 MG/ML IJ SOLN
INTRAMUSCULAR | Status: DC | PRN
  Administered 2014-10-01: .15 mg via INTRATHECAL

## 2014-10-01 MED ORDER — ACETAMINOPHEN 325 MG PO TABS: 325.0000 mg | ORAL_TABLET | ORAL | Status: DC | PRN

## 2014-10-01 MED ORDER — SODIUM CHLORIDE 0.9 % IJ SOLN: 3.0000 mL | INTRAMUSCULAR | Status: DC | PRN

## 2014-10-01 MED ORDER — SCOPOLAMINE 1 MG/3DAYS TD PT72: 1.0000 | MEDICATED_PATCH | Freq: Once | TRANSDERMAL | Status: DC

## 2014-10-01 SURGICAL SUPPLY — 33 items
APL SKNCLS STERI-STRIP NONHPOA (GAUZE/BANDAGES/DRESSINGS) ×1
BARRIER ADHS 3X4 INTERCEED (GAUZE/BANDAGES/DRESSINGS) IMPLANT
BENZOIN TINCTURE PRP APPL 2/3 (GAUZE/BANDAGES/DRESSINGS) ×2 IMPLANT
BRR ADH 4X3 ABS CNTRL BYND (GAUZE/BANDAGES/DRESSINGS)
CLAMP CORD UMBIL (MISCELLANEOUS) ×3 IMPLANT
CLOSURE WOUND 1/2 X4 (GAUZE/BANDAGES/DRESSINGS) ×1
CLOTH BEACON ORANGE TIMEOUT ST (SAFETY) ×3 IMPLANT
DRAPE SHEET LG 3/4 BI-LAMINATE (DRAPES) ×3 IMPLANT
DRSG OPSITE POSTOP 4X10 (GAUZE/BANDAGES/DRESSINGS) ×3 IMPLANT
DURAPREP 26ML APPLICATOR (WOUND CARE) ×3 IMPLANT
ELECT REM PT RETURN 9FT ADLT (ELECTROSURGICAL) ×3
ELECTRODE REM PT RTRN 9FT ADLT (ELECTROSURGICAL) ×1 IMPLANT
EXTRACTOR VACUUM KIWI (MISCELLANEOUS) IMPLANT
GLOVE BIO SURGEON STRL SZ 6.5 (GLOVE) ×2 IMPLANT
GLOVE BIO SURGEONS STRL SZ 6.5 (GLOVE) ×1
GLOVE BIOGEL PI IND STRL 7.0 (GLOVE) ×1 IMPLANT
GLOVE BIOGEL PI INDICATOR 7.0 (GLOVE) ×2
GOWN STRL REUS W/TWL LRG LVL3 (GOWN DISPOSABLE) ×6 IMPLANT
KIT ABG SYR 3ML LUER SLIP (SYRINGE) IMPLANT
NDL HYPO 25X5/8 SAFETYGLIDE (NEEDLE) IMPLANT
NEEDLE HYPO 25X5/8 SAFETYGLIDE (NEEDLE) ×3 IMPLANT
NS IRRIG 1000ML POUR BTL (IV SOLUTION) ×3 IMPLANT
PACK C SECTION WH (CUSTOM PROCEDURE TRAY) ×3 IMPLANT
PAD OB MATERNITY 4.3X12.25 (PERSONAL CARE ITEMS) ×3 IMPLANT
RETRACTOR WND ALEXIS 25 LRG (MISCELLANEOUS) ×1 IMPLANT
RTRCTR WOUND ALEXIS 25CM LRG (MISCELLANEOUS) ×3
STRIP CLOSURE SKIN 1/2X4 (GAUZE/BANDAGES/DRESSINGS) ×1 IMPLANT
SUT VIC AB 0 CT1 36 (SUTURE) ×18 IMPLANT
SUT VIC AB 2-0 CT1 27 (SUTURE) ×3
SUT VIC AB 2-0 CT1 TAPERPNT 27 (SUTURE) ×1 IMPLANT
SUT VIC AB 4-0 PS2 27 (SUTURE) ×3 IMPLANT
TOWEL OR 17X24 6PK STRL BLUE (TOWEL DISPOSABLE) ×3 IMPLANT
TRAY FOLEY CATH 14FR (SET/KITS/TRAYS/PACK) ×3 IMPLANT

## 2014-10-01 NOTE — H&P (Signed)
LABOR ADMISSION HISTORY AND PHYSICAL  Emily Payne is a 24 y.o. female G1P0000 with IUP at [redacted]w[redacted]d by LMP c/w [redacted]w[redacted]d presenting for scheduled cesarean section 2/2 breech presentation.  Dating: By LMP c/w [redacted]w[redacted]d sono --->  Estimated Date of Delivery: 10/08/14   Prenatal History/Complications:  Past Medical History: Past Medical History  Diagnosis Date  . Hypoglycemic syndrome   . Anxiety   . Depression   . Bipolar disorder     no med currently  . Migraines     otc med prn    Past Surgical History: Past Surgical History  Procedure Laterality Date  . Tonsillectomy    . Wisdom tooth extraction    . Abscess tooth      Obstetrical History: OB History    Gravida Para Term Preterm AB TAB SAB Ectopic Multiple Living        Social History: History   Social History  . Marital Status: Married    Spouse Name: N/A    Number of Children: N/A  . Years of Education: N/A   Social History Main Topics  . Smoking status: Never Smoker   . Smokeless tobacco: Never Used  . Alcohol Use: Yes     Comment: rare but none with pregnancy  . Drug Use: No  . Sexual Activity:    Partners: Male    Birth Control/ Protection: None     Comment: pregnant   Other Topics Concern  . None   Social History Narrative   G0- engaged.   Works at [employer] but also attends [employer].   Has two step children- ages 31 and 46.    Family History: Family History  Problem Relation Age of Onset  . Ovarian cysts Mother   . Stroke Maternal Grandmother   . Cancer Maternal Grandmother 27    uterine cancer    Allergies: Allergies  Allergen Reactions  . Sulfa Drugs Cross Reactors Anaphylaxis, Itching and Other (See Comments)    Flu like symptoms    Prescriptions prior to admission  Medication Sig Dispense Refill Last Dose  . acetaminophen (TYLENOL) 500 MG tablet Take 2,000 mg by mouth every 6 (six) hours as needed for headache.   Past Week at Unknown time  . calcium  carbonate (TUMS - DOSED IN MG ELEMENTAL CALCIUM) 500 MG chewable tablet Chew 2 tablets by mouth as needed for indigestion or heartburn.   Past Week at Unknown time  . cyclobenzaprine (FLEXERIL) 10 MG tablet Take 1 tablet (10 mg total) by mouth every 8 (eight) hours as needed for muscle spasms. 30 tablet 1 Past Month at Unknown time  . prenatal vitamin w/FE, FA (NATACHEW) 29-1 MG CHEW chewable tablet Chew 1 tablet by mouth daily at 12 noon. 30 tablet 5 09/30/2014 at Unknown time     Review of Systems   All systems reviewed and negative except as stated in HPI  Last menstrual period 01/01/2014. General appearance: alert and cooperative Lungs: clear to auscultation bilaterally Heart: regular rate and rhythm Abdomen: soft, non-tender; bowel sounds normal Extremities: Homans sign is negative, no sign of DVT   Prenatal labs: ABO, Rh: --/--/A NEG (01/11 1040) Antibody: POS (01/11 1040) Rubella:   RPR: Non Reactive (01/11 1040)  HBsAg: NEGATIVE (06/17 1129)  HIV: NONREACTIVE (10/29 0951)  GBS:    1 hr Glucola 99 Genetic screening  normal Anatomy US normal anatomy, poor fetal growth   Clinic  [place]  Dating  LMP consistent with 1st trimester office ultrasound  Genetic Screen Quad:  negative                  Anatomic US  needs follow up at 36 weeks for fetal growth  GTT Third trimester: 99  TDaP vaccine  07/18/2014  Flu vaccine  06/21/14  GBS  negative  Contraception  ?  Baby Food  Breast  Circumcision  desired  [facility]  Support Person  husband Viviann SpareSteven      Results for orders placed or performed during the hospital encounter of 09/30/14 (from the past 24 hour(s))  CBC   Collection Time: 09/30/14 10:40 AM  Result Value Ref Range   WBC 10.8 (H) 4.0 - 10.5 K/uL   RBC 4.51 3.87 - 5.11 MIL/uL   Hemoglobin 13.6 12.0 - 15.0 g/dL   HCT 78.240.3 95.636.0 - 21.346.0 %   MCV 89.4 78.0 - 100.0 fL   MCH 30.2 26.0 - 34.0 pg   MCHC 33.7 30.0 - 36.0 g/dL   RDW 08.613.2 57.811.5 - 46.915.5 %    Platelets 234 150 - 400 K/uL  RPR   Collection Time: 09/30/14 10:40 AM  Result Value Ref Range   RPR Ser Ql Non Reactive Non Reactive  Type and screen   Collection Time: 09/30/14 10:40 AM  Result Value Ref Range   ABO/RH(D) A NEG    Antibody Screen POS    Sample Expiration 10/03/2014    Antibody Identification PASSIVELY ACQUIRED ANTI-D    DAT, IgG NEG    Unit Number [id number]    Blood Component Type RBC LR PHER2    Unit division 00    Status of Unit ALLOCATED    Transfusion Status OK TO TRANSFUSE    Crossmatch Result COMPATIBLE    Unit Number [id number]    Blood Component Type RBC CPDA1, LR    Unit division 00    Status of Unit ALLOCATED    Transfusion Status OK TO TRANSFUSE    Crossmatch Result COMPATIBLE     Patient Active Problem List   Diagnosis Date Noted  . Obesity complicating pregnancy   . Poor fetal growth affecting management of mother in third trimester   . Frank breech   . Poor fetal growth   . Rh negative, antepartum 03/07/2014  . Supervision of normal first pregnancy 03/06/2014  . Dandruff 06/22/2013  . Bipolar disorder, unspecified 06/22/2013  . Obesity 07/04/2012  . Migraine with aura 07/03/2012    Assessment: Emily Payne is a 24 y.o. G1P0000 at 8266w0d here for primary cesarean 2/2 breech presentation  Breast, requests circ Rhogam if indicated postpartum Discuss contraception as still undecided  The risks of cesarean section discussed with the patient included but were not limited to: bleeding which may require transfusion or reoperation; infection which may require antibiotics; injury to bowel, bladder, ureters or other surrounding organs; injury to the fetus; need for additional procedures including hysterectomy in the event of a life-threatening hemorrhage; placental abnormalities wth subsequent pregnancies, incisional problems, thromboembolic phenomenon and other postoperative/anesthesia complications. The patient concurred with the proposed  plan, giving informed written consent for the procedure. She will remain NPO for procedure. Anesthesia and OR aware. Preoperative prophylactic antibiotics and SCDs ordered on call to the OR.  To OR when ready.   Emily Payne 10/01/2014, 8:02 AM

## 2014-10-01 NOTE — Anesthesia Procedure Notes (Signed)
Spinal Patient location during procedure: OR Start time: 10/01/2014 10:37 AM Staffing Anesthesiologist: Viral Schramm A. Preanesthetic Checklist Completed: patient identified, site marked, surgical consent, pre-op evaluation, timeout performed, IV checked, risks and benefits discussed and monitors and equipment checked Spinal Block Patient position: sitting Prep: site prepped and draped and DuraPrep Patient monitoring: heart rate, cardiac monitor, continuous pulse ox and blood pressure Approach: midline Location: L4-5 Injection technique: single-shot Needle Needle type: Sprotte  Needle gauge: 24 G Needle length: 9 cm Needle insertion depth: 7 cm Assessment Sensory level: T4 Additional Notes Patient tolerated procedure well. Attempt x 2. Adequate sensory level.

## 2014-10-01 NOTE — Anesthesia Postprocedure Evaluation (Signed)
Anesthesia Post Note  Patient: Emily Payne  Procedure(s) Performed: Procedure(s) (LRB): PRIMARY CESAREAN SECTION (N/A)  Anesthesia type: Spinal  Patient location: PACU  Post pain: Pain level controlled  Post assessment: Post-op Vital signs reviewed  Last Vitals:  Filed Vitals:   10/01/14 1245  BP: 142/70  Pulse: 67  Temp:   Resp: 17    Post vital signs: Reviewed  Level of consciousness: awake  Complications: No apparent anesthesia complications

## 2014-10-01 NOTE — Lactation Note (Signed)
This note was copied from the chart of Emily Payne. Lactation Consultation Note  Patient Name: Emily Tresea Mallshley Gotcher XBJYN'WToday's Date: 10/01/2014 Reason for consult: Initial assessment Mom reports baby has been sleepy and will not latch. LC undressed baby, changed diaper and wakened baby. Demonstrated hand expression to Mom, colostrum present. LC assisted Mom with latching baby in football hold. Demonstrated to parents how to sandwich Mom's nipple/aerola to help with latch. Baby sleepy but did develop a good rhythmic suck with swallowing motions noted. Several drops of colostrum present with hand expression. Demonstrated using hand pump to help with latch. Mom's nipples are erect but with short shaft, very compressible. BF basics reviewed with Mom. Lactation brochure left for review, advised of OP services and support group. Encouraged to call for assist as needed.   Maternal Data Has patient been taught Hand Expression?: Yes Does the patient have breastfeeding experience prior to this delivery?: No  Feeding Feeding Type: Breast Fed Length of feed: 10 min  LATCH Score/Interventions Latch: Repeated attempts needed to sustain latch, nipple held in mouth throughout feeding, stimulation needed to elicit sucking reflex. Intervention(s): Adjust position;Assist with latch;Breast massage;Breast compression  Audible Swallowing: A few with stimulation  Type of Nipple: Everted at rest and after stimulation (short shaft)  Comfort (Breast/Nipple): Soft / non-tender     Hold (Positioning): Assistance needed to correctly position infant at breast and maintain latch. Intervention(s): Breastfeeding basics reviewed;Support Pillows;Position options;Skin to skin  LATCH Score: 7  Lactation Tools Discussed/Used Tools: Pump Breast pump type: Manual WIC Program: No   Consult Status Consult Status: Follow-up Date: 10/02/14 Follow-up type: In-patient    Alfred LevinsGranger, Kairi Tufo Ann 10/01/2014, 10:15  PM

## 2014-10-01 NOTE — Op Note (Signed)
Emily Payne PROCEDURE DATE: 10/01/2014  PREOPERATIVE DIAGNOSES: Intrauterine pregnancy at [redacted]w[redacted]d weeks gestation; breech presentation  POSTOPERATIVE DIAGNOSES: The same, frank breech presentation  PROCEDURE: Primary Low Transverse Cesarean Section  SURGEON:  Dr. Scheryl Darter  ASSISTANT:  Dr. Fredirick Lathe  ANESTHESIOLOGIST: Dr. Malen Gauze  INDICATIONS: Emily Payne is a 24 y.o. G1P1001 at [redacted]w[redacted]d here for cesarean section secondary to the indications listed under preoperative diagnoses; please see preoperative note for further details.  The risks of cesarean section were discussed with the patient including but were not limited to: bleeding which may require transfusion or reoperation; infection which may require antibiotics; injury to bowel, bladder, ureters or other surrounding organs; injury to the fetus; need for additional procedures including hysterectomy in the event of a life-threatening hemorrhage; placental abnormalities wth subsequent pregnancies, incisional problems, thromboembolic phenomenon and other postoperative/anesthesia complications.   The patient concurred with the proposed plan, giving informed written consent for the procedure.    FINDINGS:  Viable female infant in cephalic presentation.  Apgars 9 and 9.  Clear amniotic fluid.  Intact placenta, three vessel cord.  Normal uterus, fallopian tubes and ovaries bilaterally..   ANESTHESIA: Spinal INTRAVENOUS FLUIDS: 1700 ml ESTIMATED BLOOD LOSS: 700 ml URINE OUTPUT:  250 ml SPECIMENS: Placenta sent to L&D COMPLICATIONS: None immediate  PROCEDURE IN DETAIL:  The patient preoperatively received intravenous antibiotics and had sequential compression devices applied to her lower extremities.  She was then taken to the operating room where spinal anesthesia was administered and was found to be adequate. She was then placed in a dorsal supine position with a leftward tilt, and prepped and draped in a sterile manner.  A foley catheter  was placed into her bladder and attached to constant gravity.  After an adequate timeout was performed, a Pfannenstiel skin incision was made with scalpel and carried through to the underlying layer of fascia. The fascia was incised in the midline, and this incision was extended bilaterally using the Mayo scissors.  Kocher clamps were applied to the superior aspect of the fascial incision and the underlying rectus muscles were dissected off bluntly. A similar process was carried out on the inferior aspect of the fascial incision. The rectus muscles were separated in the midline bluntly and the peritoneum was entered sharply with hemostat and metzenbaums. Attention was turned to the lower uterine segment where a low transverse hysterotomy was made with a scalpel and extended bilaterally bluntly.  The infant was successfully delivered from frank breech, the cord was clamped and cut and the infant was handed over to awaiting neonatology team. Uterine massage was then administered, and the placenta delivered intact with a three-vessel cord. The uterus was then cleared of clot and debris.  The hysterotomy was closed with 0 Vicryl in a running locked fashion, and an imbricating layer was also placed with 0 Vicryl. The pelvis was cleared of all clot and debris. Hemostasis was confirmed on all surfaces.  The peritoneum and the muscles were reapproximated using 0 Vicryl interrupted stitches. The fascia was then closed using 0 Vicryl in a running fashion.  The subcutaneous layer was irrigated, then reapproximated with 2-0 plain gut interrupted stitches, and 30 ml of 0.5% Marcaine was injected subcutaneously around the incision.  The skin was closed with a 4-0 Vicryl subcuticular stitch. The patient tolerated the procedure well. Sponge, lap, instrument and needle counts were correct x 2.  She was taken to the recovery room in stable condition.   Perry Mount, MD OB Fellow Faculty Practice,  [facility]

## 2014-10-01 NOTE — Transfer of Care (Signed)
Immediate Anesthesia Transfer of Care Note  Patient: Emily Payne  Procedure(s) Performed: Procedure(s): PRIMARY CESAREAN SECTION (N/A)  Patient Location: PACU  Anesthesia Type:Spinal  Level of Consciousness: awake, alert  and oriented  Airway & Oxygen Therapy: Patient Spontanous Breathing  Post-op Assessment: Report given to PACU RN and Post -op Vital signs reviewed and stable  Post vital signs: Reviewed and stable  Complications: No apparent anesthesia complications

## 2014-10-01 NOTE — Anesthesia Preprocedure Evaluation (Signed)
Anesthesia Evaluation  Patient identified by MRN, date of birth, ID band Patient awake    Reviewed: Allergy & Precautions, H&P , NPO status , Patient's Chart, lab work & pertinent test results  Airway Mallampati: II       Dental   Pulmonary  breath sounds clear to auscultation        Cardiovascular Exercise Tolerance: Good Rhythm:regular Rate:Normal     Neuro/Psych  Headaches, PSYCHIATRIC DISORDERS    GI/Hepatic   Endo/Other    Renal/GU      Musculoskeletal   Abdominal   Peds  Hematology   Anesthesia Other Findings Hypoglycemic syndrome  Reproductive/Obstetrics (+) Pregnancy                             Anesthesia Physical Anesthesia Plan  ASA: II  Anesthesia Plan: Spinal   Post-op Pain Management:    Induction:   Airway Management Planned:   Additional Equipment:   Intra-op Plan:   Post-operative Plan:   Informed Consent: I have reviewed the patients History and Physical, chart, labs and discussed the procedure including the risks, benefits and alternatives for the proposed anesthesia with the patient or authorized representative who has indicated his/her understanding and acceptance.     Plan Discussed with: Anesthesiologist, CRNA and Surgeon  Anesthesia Plan Comments:         Anesthesia Quick Evaluation

## 2014-10-02 ENCOUNTER — Encounter (HOSPITAL_COMMUNITY): Payer: Self-pay | Admitting: Obstetrics & Gynecology

## 2014-10-02 DIAGNOSIS — O321XX0 Maternal care for breech presentation, not applicable or unspecified: Secondary | ICD-10-CM | POA: Diagnosis present

## 2014-10-02 LAB — CBC
HCT: 34.3 % — ABNORMAL LOW (ref 36.0–46.0)
Hemoglobin: 11.7 g/dL — ABNORMAL LOW (ref 12.0–15.0)
MCH: 30.4 pg (ref 26.0–34.0)
MCHC: 34.1 g/dL (ref 30.0–36.0)
MCV: 89.1 fL (ref 78.0–100.0)
PLATELETS: 211 10*3/uL (ref 150–400)
RBC: 3.85 MIL/uL — ABNORMAL LOW (ref 3.87–5.11)
RDW: 13.2 % (ref 11.5–15.5)
WBC: 14.4 10*3/uL — ABNORMAL HIGH (ref 4.0–10.5)

## 2014-10-02 LAB — BIRTH TISSUE RECOVERY COLLECTION (PLACENTA DONATION)

## 2014-10-02 NOTE — Progress Notes (Signed)
Clinical Social Work Department PSYCHOSOCIAL ASSESSMENT - MATERNAL/CHILD 10/02/2014  Patient:  Emily Payne,Emily Payne  Account Number:  [id number]  Admit Date:  10/01/2014  Childs Name:   Cooper   Clinical Social Worker:  Onnie Alatorre, CLINICAL SOCIAL WORKER   Date/Time:  10/02/2014 10:00 AM  Date Referred:  10/01/2014   Referral source  Central Nursery     Referred reason  Behavioral Health Issues   Other referral source:    I:  FAMILY / HOME ENVIRONMENT Child's legal guardian:  PARENT  Guardian - Name Guardian - Age Guardian - Address  [place] [address] [place], [place] [address]  Mr. Mcglasson  Same residence   Other household support members/support persons Other support:   MOB identified her parents as her primary support people, but stated that it is often difficult for her to ask for help. She also reported numerous friends who live nearby and are supportive.   II  PSYCHOSOCIAL DATA Information Source:  Family Interview  Financial and Community Resources Employment:   MOB stated that she works weekends at [employer]. She reported that the FOB is employed full time.   Financial resources:  Medicaid If Medicaid - County:  [place]  School / Grade:  N/A Maternity Care Coordinator / Child Services Coordination / Early Interventions:   None reported  Cultural issues impacting care:   None reported   III  STRENGTHS Strengths  Adequate Resources  Home prepared for Child (including basic supplies)  Supportive family/friends   Strength comment:    IV  RISK FACTORS AND CURRENT PROBLEMS Current Problem:  YES   Risk Factor & Current Problem Patient Issue Family Issue Risk Factor / Current Problem Comment  Mental Illness Y N MOB presents with history of anxiety, depression, and bipolar.  MOB was prescribed medications for anxiety prior to pregnancy and was followed by a psychiatrist. She denied current medications or treatment for mental health.    V  SOCIAL WORK  ASSESSMENT CSW met with the MOB due to history of depression, anxiety, and bipolar.  MOB provided consent for her mother to be present for the entire visit.  MOB was in a pleasant mood, displayed a bright and cheerful affect, and was easily engaged/receptive to the visit.  She openly discussed her history of anxiety, presents with insight and awareness of her mental health needs, and recognizes numerous of her anxious thought processes that she frequently engages in.  MOB did not present any other acute mental health symptoms.   MOB smiled as she reflected upon her thoughts and feelings as she transition into the postpartum period. She shared with the CSW all that she has learned about her baby so far, and discussed how she is looking forward to her new role as a mother.  MOB discussed the strong family support that she has and shared that the home is prepared for Cooper's arrival.   CSW spent majority of time with the MOB assisting her to process her anxiety/depression.  With minimal prompting, MOB began to discuss her anxiety and feelings of being overwhelmed during the pregnancy and since she has delivered the baby.  She presents with insight and self-awareness in regards to the connection between her anxiety and depression and related to effective emotional regulation skills.  She recognizes that she tends to ruminate on stressors and the unknown future, which leads her to feel depressed, which causes her anxiety to worsen even more since she is immobilized by her depression and is unable to   accomplish what needs to do.  She stated that prior to the pregnancy, she was seen by a psychiatrist who prescribed her two medications for her anxiety.  The MOB was unable to recall the medications, but stated that she chose to discontinue the medications due to potential side effects.  She shared that she has "done well" without the medications, but acknowledges that she continued to have anxiety during the pregnancy.  The MGM confirmed that the MOB has demonstrated increased ability to self-regulate and to reduce anxious thoughts during the pregnancy.  The MOB shared that she does not intend to re-start medications at this time, due to desire to "do it on my own".  MOB acknowledged that she has an increased risk for developing postpartum depression and anxiety, and shared belief that she would re-start medications if symptoms worsened since she wants to ensure that her baby is well taken care of and she is the mother she wants to be.    The MOB also shared anxiety since the baby has been born as she reflected upon the difficulties she had sleeping overnight out of fear that she would miss the cues if she were asleep.  The MOB recognizes a need to sleep since she knows that she cannot function well without it, but she struggles to disengage from her fear.  She stated that she also feels like a burden if she asks her mother to help despite her mother telling her that she is at the hospital in order to provide her with support and an opportunity to sleep/rest.  Overall, the MOB recognizes the potential negative outcomes if she continues to be consumed by her anxiety/anxious thoughts, but will require extra support to continue to disengage from her thoughts.  She stated that it has been helpful for her to focus on the present moment when she begins to feel overwhelmed, and she stated that the most effective strategy is for her to focus on/interact with the baby.  CSW inquired about bipolar diagnosis that is listed on her chart.  The MOB and MGM reported diagnosis at age 16/17, but shared belief that it is no longer an accurate diagnosis.  The MOB confirmed that she was only treated for anxiety prior to her pregnancy.   No barriers to discharge.     VI SOCIAL WORK PLAN Social Work Plan  Patient/Family Education  No Further Intervention Required / No Barriers to Discharge   Type of pt/family education:   Postpartum  depression.   If child protective services report - county:   If child protective services report - date:   Information/referral to community resources comment:   MOB has an outpatient psychiatrist that she can return to in the postpartum period.   Other social work plan:   CSW to follow up PRN.     

## 2014-10-02 NOTE — Addendum Note (Signed)
Addendum  created 10/02/14 0845 by Yolonda KidaAlison L Jule Whitsel, CRNA   Modules edited: Notes Section   Notes Section:  File: [id number]

## 2014-10-02 NOTE — Lactation Note (Signed)
This note was copied from the chart of Emily Tresea Mallshley Credit. Lactation Consultation Note  Patient Name: Emily Payne ZOXWR'UToday's Date: 10/02/2014 Reason for consult: Follow-up assessment of this mom and baby at 33 hours pp. Baby has had multiple feedings of 15-30 minutes  And copious output, although she reports that baby is sleepy at some feedings.  LC encouraged cue feedings and STS and mom to call as needed for latch assistance.  Most recent LATCH score=7 per RN assessment.   Maternal Data    Feeding Feeding Type: Breast Fed  LATCH Score/Interventions Latch: Repeated attempts needed to sustain latch, nipple held in mouth throughout feeding, stimulation needed to elicit sucking reflex. Intervention(s): Skin to skin;Teach feeding cues Intervention(s): Breast massage;Assist with latch;Adjust position  Audible Swallowing: A few with stimulation Intervention(s): Skin to skin  Type of Nipple: Everted at rest and after stimulation  Comfort (Breast/Nipple): Soft / non-tender     Hold (Positioning): Assistance needed to correctly position infant at breast and maintain latch. Intervention(s): Breastfeeding basics reviewed  LATCH Score: 7 (RN assessment at previous feeding)  Lactation Tools Discussed/Used   Ad lib cue feedings, STS and holding for comfort  Consult Status Consult Status: Follow-up Date: 10/03/14 Follow-up type: In-patient    Emily Payne, Emily Payne [facility] 10/02/2014, 9:32 PM

## 2014-10-02 NOTE — Anesthesia Postprocedure Evaluation (Signed)
  Anesthesia Post-op Note  Patient: Emily Payne  Procedure(s) Performed: Procedure(s): PRIMARY CESAREAN SECTION (N/A)  Patient Location: Mother/Baby  Anesthesia Type:Spinal  Level of Consciousness: awake, alert , oriented and patient cooperative  Airway and Oxygen Therapy: Patient Spontanous Breathing  Post-op Pain: mild  Post-op Assessment: Post-op Vital signs reviewed, Patient's Cardiovascular Status Stable, Respiratory Function Stable, Patent Airway, No headache, No backache, No residual numbness and No residual motor weakness  Post-op Vital Signs: Reviewed and stable  Last Vitals:  Filed Vitals:   10/02/14 0530  BP: 116/60  Pulse: 75  Temp: 36.9 C  Resp: 18    Complications: No apparent anesthesia complications

## 2014-10-02 NOTE — Progress Notes (Signed)
Post Partum Day 1  Subjective: no complaints, up ad lib, voiding, tolerating PO and + flatus  Objective: Blood pressure 116/60, pulse 75, temperature 98.4 F (36.9 C), temperature source Oral, resp. rate 18, height 5\' 9"  (1.753 m), weight 113.853 kg (251 lb), last menstrual period 01/01/2014, SpO2 98 %, unknown if currently breastfeeding.   Emily Payne is a 24 y.o. G1P1001 who delivered a bb at 5261w0d on 01/12 at 10:37 via PLTCS 2/2 breech presentation. Has done well ON, VSS, pain controlled with ibuprofen prn. Ambulating well, tolerating PO solids and liquids well, pain at 2/10, voiding ok, passing flatus, no BM yet.    Pt is breastfeeding. Planning on OP circumcision, and will use IUD for birth control.   Physical Exam:  General: alert, cooperative, appears stated age and no distress Lochia: appropriate Uterine Fundus: firm, however difficult to palpate Incision: healing well, no significant drainage, no significant erythema, dressings present, small amount of dried blood present on the right 1/3 of the dressing DVT Evaluation: No evidence of DVT seen on physical exam. No cords or calf tenderness. No significant calf/ankle edema.   Recent Labs  09/30/14 1040 10/02/14 0555  HGB 13.6 11.7*  HCT 40.3 34.3*    Assessment/Plan: Plan for discharge tomorrow, Breastfeeding and Contraception IUD.  RhoGAM as needed order in place.    LOS: 1 day   Press Casale, Marlana Salvagenna M 10/02/2014, 7:37 AM

## 2014-10-02 NOTE — Progress Notes (Signed)
UR chart review completed.  

## 2014-10-03 MED ORDER — IBUPROFEN 600 MG PO TABS: 600.0000 mg | ORAL_TABLET | Freq: Four times a day (QID) | ORAL | Status: DC | PRN

## 2014-10-03 MED ORDER — OXYCODONE-ACETAMINOPHEN 5-325 MG PO TABS: 1.0000 | ORAL_TABLET | ORAL | Status: DC | PRN

## 2014-10-03 NOTE — Discharge Instructions (Signed)

## 2014-10-03 NOTE — Lactation Note (Addendum)
This note was copied from the chart of Emily Payne. Lactation Consultation Note      Follow up consult with this mom and baby, now 5449 hours old, full term, and small, at 4% weight loss last night, weighing 6 lbs 5.4 oz now.. I was asked by Dr Ave Filterchandler to see this mom and baby, to assess his latch. She had concerns for a tight mouth. With finger sucking, he did not pull my finger into his mouth  at first, but then did after a minute, and his tongue did not come under my finger, over his bottom gums until then also, but did eventually.   I could hear swallows.  Mom says latching is somewhat uncomfortable at first, but then fine. Her nipples are in very good condition at this time On exam of baby's mouth, he has a tight lip frenulum, causing his upper lip to blanch with flanging. 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 Her mom did not  breast feed, and mom herself has had no speech problems, or any issues with this.  Dr. Ave Filterhandler to be made aware of my findings.  I reweighed the baby, at mom's request. He is now at 9% weight loss, and 50 hours old.  Mom has a [place] DEP at home to use. [facility]. and I made an o/p lactation appointment for 1/21 at 9 am.   Patient Name: Emily Payne ZOXWR'UToday's Date: 10/03/2014 Reason for consult: Follow-up assessment   Maternal Data    Feeding Feeding Type: Breast Fed Length of feed: 0 min  LATCH Score/Interventions Latch: Grasps breast easily, tongue down, lips flanged, rhythmical sucking. Intervention(s): Skin to skin Intervention(s): Assist with latch  Audible Swallowing: Spontaneous and intermittent Intervention(s): Skin to skin;Hand expression  Type of Nipple: Everted at rest and after stimulation  Comfort (Breast/Nipple): Soft / non-tender     Hold (Positioning): Assistance  needed to correctly position infant at breast and maintain latch. Intervention(s): Breastfeeding basics reviewed;Support Pillows;Position options;Skin to skin  LATCH Score: 9  Lactation Tools Discussed/Used     Consult Status Consult Status: Follow-up Date: 10/03/14 Follow-up type: In-patient    Alfred LevinsLee, Jaliah Foody Anne 10/03/2014, 12:47 PM

## 2014-10-03 NOTE — Discharge Summary (Signed)
Obstetric Discharge Summary Reason for Admission: cesarean section- scheduled for breech Prenatal Procedures: none Intrapartum Procedures: cesarean: low cervical, transverse Postpartum Procedures: none Complications-Operative and Postpartum: none HEMOGLOBIN  Date Value Ref Range Status  10/02/2014 11.7* 12.0 - 15.0 g/dL Final   HCT  Date Value Ref Range Status  10/02/2014 34.3* 36.0 - 46.0 % Final   [name] is a 24 y.o. G1P1001 who delivered a bb at 6171w0d on 01/12 at 10:37 via PLTCS 2/2 breech presentation. Has done well ON, VSS, pain controlled with ibuprofen and percoset prn. Ambulating well, tolerating PO solids and liquids well, pain at 4/10, voiding ok, BM last night.   Pt is breastfeeding. Planning on OP circumcision, and will use IUD for birth control.   Physical Exam:  General: alert, cooperative, appears stated age and no distress Lochia: appropriate Uterine Fundus: firm Incision: healing well, no significant drainage, no dehiscence, no significant erythema; dressing in place, dried blood over right 1/3 or incision DVT Evaluation: No evidence of DVT seen on physical exam. No cords or calf tenderness. No significant calf/ankle edema.  Discharge Diagnoses: Term Pregnancy-delivered  Discharge Information: Date: 10/03/2014 Activity: unrestricted and as tolerated Diet: routine Medications: Percocet Condition: stable Instructions: refer to practice specific booklet Discharge to: home   Newborn Data: Live born female  Birth Weight: 6 lb 13.4 oz (3100 g) APGAR: 9, 9  Home with mother.  Misior, Emily Salvagenna M 10/03/2014, 8:47 AM   I have seen and examined this patient and I agree with the above. Cam HaiSHAW, Emily Payne CNM 9:24 AM 10/03/2014

## 2014-10-04 LAB — TYPE AND SCREEN
ABO/RH(D): A NEG
Antibody Screen: POSITIVE
DAT, IgG: NEGATIVE
UNIT DIVISION: 0
Unit division: 0

## 2014-11-12 ENCOUNTER — Encounter: Payer: Self-pay | Admitting: Obstetrics & Gynecology

## 2014-11-12 ENCOUNTER — Ambulatory Visit (INDEPENDENT_AMBULATORY_CARE_PROVIDER_SITE_OTHER): Payer: Medicaid Other | Admitting: Obstetrics & Gynecology

## 2014-11-12 MED ORDER — MISOPROSTOL 200 MCG PO TABS: ORAL_TABLET | ORAL | Status: DC

## 2014-11-12 NOTE — Progress Notes (Signed)
Post part check, patient would like skyla IUD placed but she did have unprotected intercourse last week so she will schedule to come back for this in two weeks with no unprotected intercourse.  Last time she had it place they prescribed something to soften her cervix as well.

## 2014-11-12 NOTE — Progress Notes (Signed)
  Subjective:     Emily Payne is a 24 y.o. female who presents for a postpartum visit. She is 6 weeks postpartum following a low cervical transverse Cesarean section. I have fully reviewed the prenatal and intrapartum course. The delivery was at 39  gestational weeks. Outcome: primary cesarean section, low transverse incision. Anesthesia: spinal. Postpartum course has been normal. Baby's course has been normal. Baby is feeding by breast. Bleeding no bleeding. Bowel function is normal. Bladder function is normal. Patient is sexually active. Contraception method is none. Postpartum depression screening: negative.  The following portions of the patient's history were reviewed and updated as appropriate: allergies, current medications, past family history, past medical history, past social history, past surgical history and problem list.  Review of Systems Pertinent items are noted in HPI.   Objective:    BP 116/85 mmHg  Pulse 85  Ht 5\' 9"  (1.753 m)  Wt 233 lb 12.8 oz (106.051 kg)  BMI 34.51 kg/m2  Breastfeeding? Yes  General:  alert   Breasts:  inspection negative, no nipple discharge or bleeding, no masses or nodularity palpable  Lungs: clear to auscultation bilaterally  Heart:  regular rate and rhythm, S1, S2 normal, no murmur, click, rub or gallop  Abdomen: soft, non-tender; bowel sounds normal; no masses,  no organomegaly  INCISION: healed well   Vulva:  not examined  Vagina: not evaluated  Cervix:  not evaluated  Corpus: not examined  Adnexa:  not evaluated  Rectal Exam: Not performed.        Assessment:     Normal postpartum exam. Pap smear not done at today's visit.   Plan:    1. Contraception: IUD 2. Cytotec e-prescribed for the night prior to Emily Payne insertion 3. Follow up in: 1 week or as needed.

## 2014-11-25 ENCOUNTER — Ambulatory Visit (INDEPENDENT_AMBULATORY_CARE_PROVIDER_SITE_OTHER): Payer: Medicaid Other | Admitting: Obstetrics & Gynecology

## 2014-11-25 ENCOUNTER — Encounter: Payer: Self-pay | Admitting: Obstetrics & Gynecology

## 2014-11-25 VITALS — BP 127/94 | HR 92 | Ht 69.0 in | Wt 237.0 lb

## 2014-11-25 DIAGNOSIS — Z30430 Encounter for insertion of intrauterine contraceptive device: Secondary | ICD-10-CM

## 2014-11-25 DIAGNOSIS — Z01812 Encounter for preprocedural laboratory examination: Secondary | ICD-10-CM

## 2014-11-25 LAB — POCT URINE PREGNANCY: Preg Test, Ur: NEGATIVE

## 2014-11-25 NOTE — Progress Notes (Signed)
   Subjective:    Patient ID: Emily Payne, female    DOB: 02/19/1991, 24 y.o.   MRN: [id number]  HPI  24 yo MW P1 here at 2 months pp for another [place]. She took cytotec last night. She has no questions.  Review of Systems     Objective:   Physical Exam UPT negative, consent signed, Time out procedure done. Cervix prepped with betadine and grasped with a single tooth tenaculum. Emily Payne was easily placed and the strings were cut to 3-4 cm. Uterus sounded to 9 cm. She tolerated the procedure well.         Assessment & Plan:  Contraception- Skyla RTC 4 weeks for string check

## 2014-12-24 ENCOUNTER — Encounter: Payer: Self-pay | Admitting: Obstetrics & Gynecology

## 2014-12-24 ENCOUNTER — Ambulatory Visit (INDEPENDENT_AMBULATORY_CARE_PROVIDER_SITE_OTHER): Payer: Medicaid Other | Admitting: Obstetrics & Gynecology

## 2014-12-24 VITALS — BP 130/84 | HR 67 | Ht 69.0 in | Wt 237.0 lb

## 2014-12-24 DIAGNOSIS — Z30431 Encounter for routine checking of intrauterine contraceptive device: Secondary | ICD-10-CM

## 2014-12-24 NOTE — Progress Notes (Signed)
   Subjective:    Patient ID: Emily Payne, female    DOB: [birth year], 24 y.o.   MRN: [id number]  HPI  This 24 yo MW P581 (633 month old son) is here for a string check. She had a Skyla placed last month (She will want another pregnancy in about 2 years). She is breast feeding and has no period. She had Mirena in the past and had no period with it. She has no complaints.  Review of Systems     Objective:   Physical Exam  [id number] Breathing and ambulating normally Strings seen on cervix      Assessment & Plan:  Contraception- Skyla RTC 1 year/prn

## 2015-02-21 ENCOUNTER — Encounter: Payer: Self-pay | Admitting: Obstetrics and Gynecology

## 2015-02-21 ENCOUNTER — Ambulatory Visit (INDEPENDENT_AMBULATORY_CARE_PROVIDER_SITE_OTHER): Payer: Medicaid Other | Admitting: Obstetrics and Gynecology

## 2015-02-21 VITALS — BP 125/84 | HR 86 | Wt 245.0 lb

## 2015-02-21 DIAGNOSIS — F329 Major depressive disorder, single episode, unspecified: Secondary | ICD-10-CM | POA: Diagnosis not present

## 2015-02-21 DIAGNOSIS — F32A Depression, unspecified: Secondary | ICD-10-CM | POA: Insufficient documentation

## 2015-02-21 MED ORDER — SERTRALINE HCL 50 MG PO TABS: 50.0000 mg | ORAL_TABLET | Freq: Every day | ORAL | Status: DC

## 2015-02-21 NOTE — Progress Notes (Signed)
Patient ID: Emily Payne, female   DOB: [birth year], 24 y.o.   MRN: [id number] 24 yo G1P1 presenting today with concerns of depression and anxiety. She reports feeling excessively nervous if there is thunder storms because she is concerned about the safety of her 1065 month old. She is not able to sleep at night if her son has a cold because she has to make sure "he is still breathing". She often cries without reasons. She denies suicidal/homicidal ideation. She reports having a good support system  Past Medical History  Diagnosis Date  . Hypoglycemic syndrome   . Anxiety   . Depression   . Bipolar disorder     no med currently  . Migraines     otc med prn   Past Surgical History  Procedure Laterality Date  . Tonsillectomy    . Wisdom tooth extraction    . Abscess tooth    . Cesarean section N/A 10/01/2014    Procedure: PRIMARY CESAREAN SECTION;  Surgeon: Adam PhenixJames G Arnold, MD;  Location: [facility];  Service: Obstetrics;  Laterality: N/A;   Family History  Problem Relation Age of Onset  . Ovarian cysts Mother   . Stroke Maternal Grandmother   . Cancer Maternal Grandmother 27    uterine cancer   History  Substance Use Topics  . Smoking status: Never Smoker   . Smokeless tobacco: Never Used  . Alcohol Use: 0.0 oz/week    0 Standard drinks or equivalent per week     Comment: rare but none with pregnancy   ROS See pertinent in HPI  GENERAL: Well-developed, well-nourished female in no acute distress.  EXTREMITIES: No cyanosis or edema  A/P 24 yo G1P1 with anxiety/depression - Reassurance provided regarding these normal overwhelming feeling of a new mother - Rx Zoloft provided - Referral to behavioral health provided - RTC in 3 weeks, if not seen by behavioral health or prn

## 2015-05-12 ENCOUNTER — Other Ambulatory Visit: Payer: Self-pay | Admitting: Obstetrics and Gynecology

## 2015-07-23 IMAGING — US US OB FOLLOW-UP
1 series · 12 of 28 positions shown · non-contrast
Comparison: none

[Series 1: us ob follow-up · 0.23mm/px · 12 of 56 slices shown]
[im 3/56]
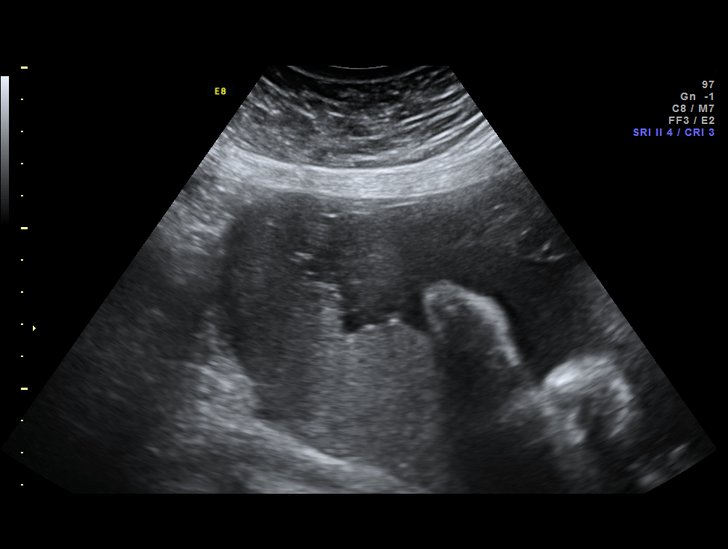
[im 7/56]
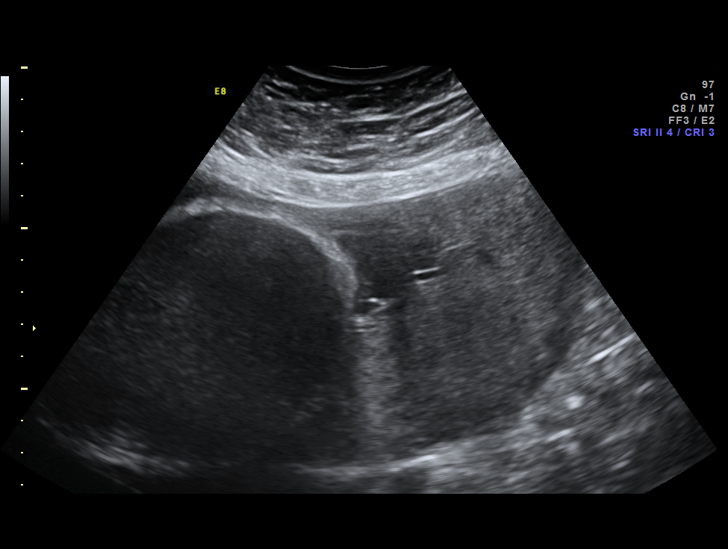
[im 11/56]
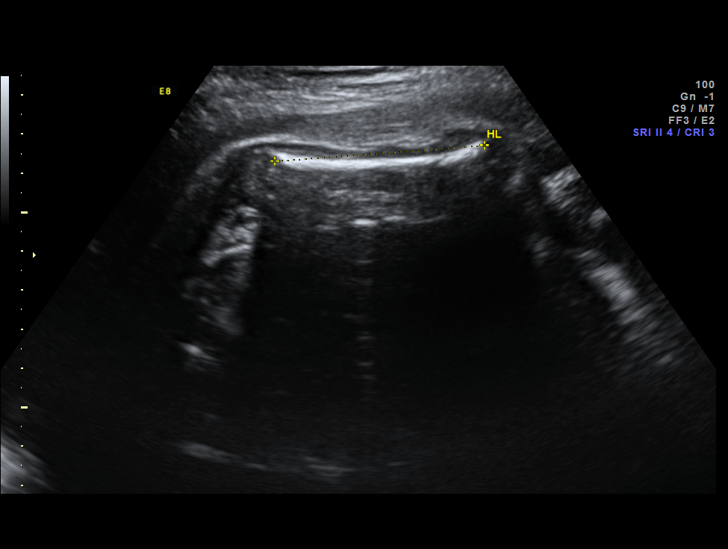
[im 17/56]
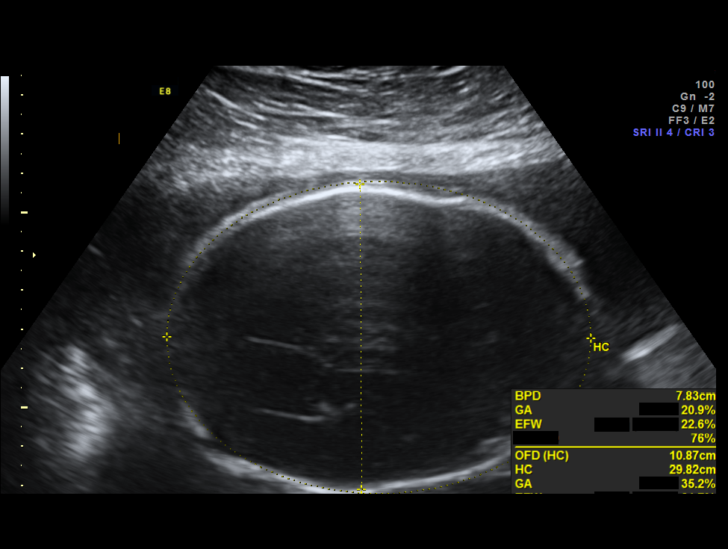
[im 21/56]
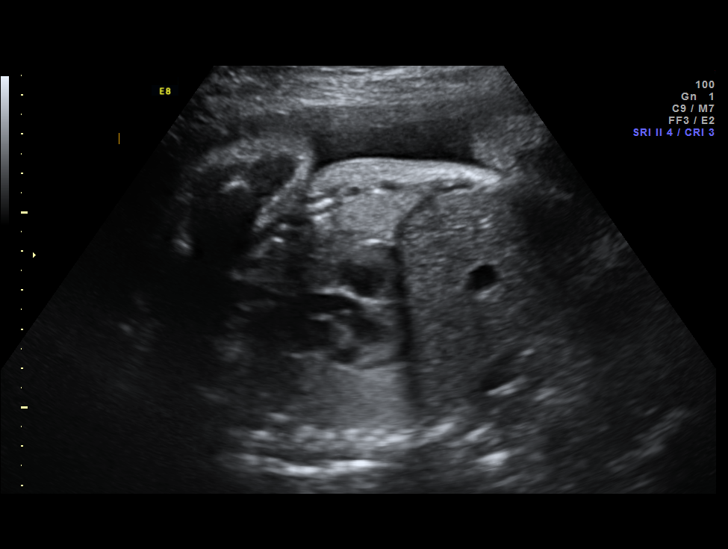
[im 25/56]
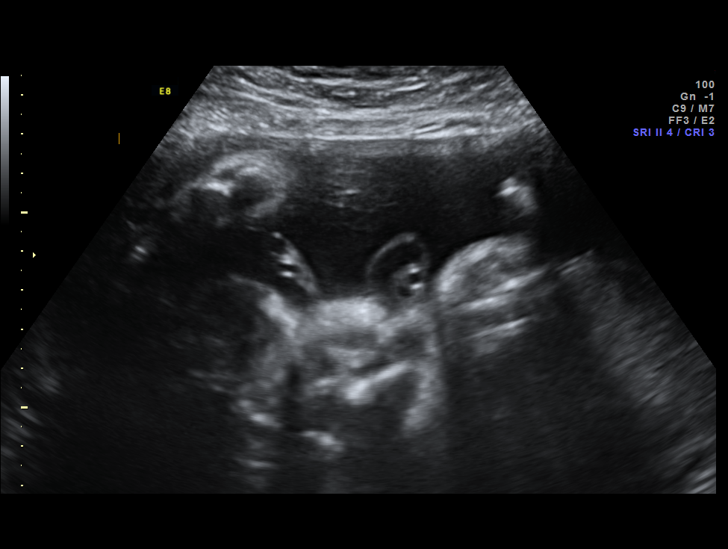
[im 31/56]
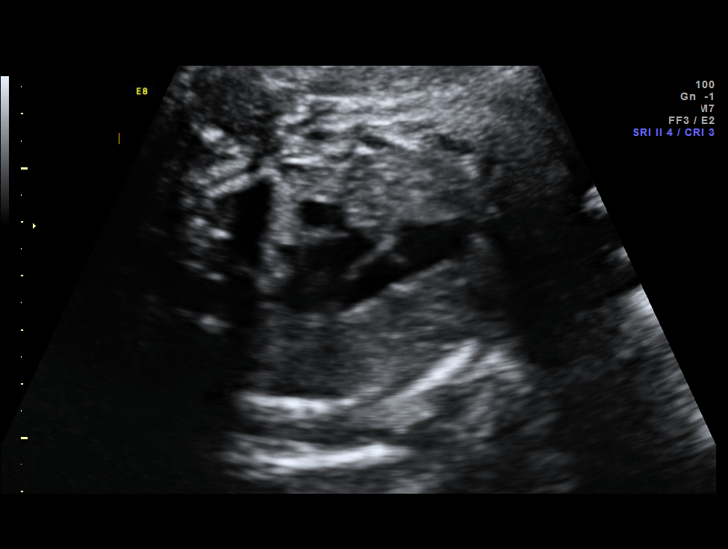
[im 35/56]
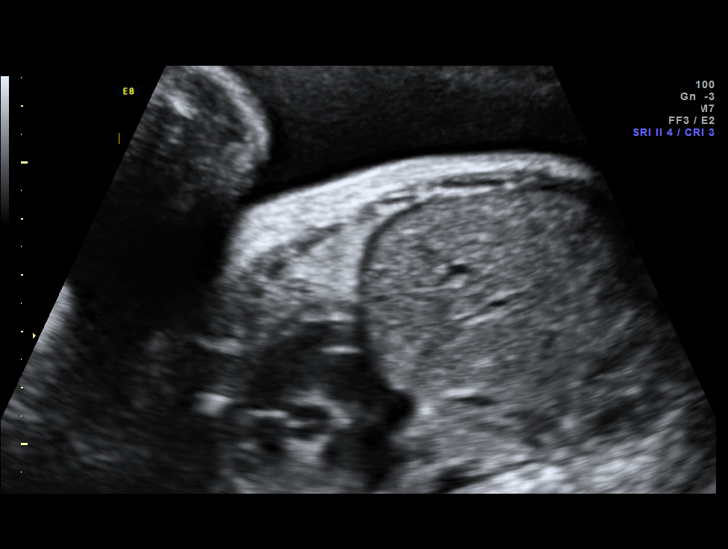
[im 39/56]
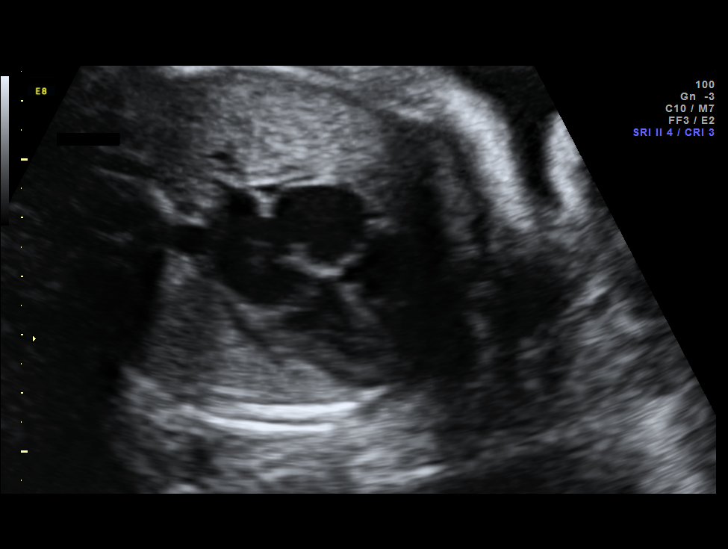
[im 45/56]
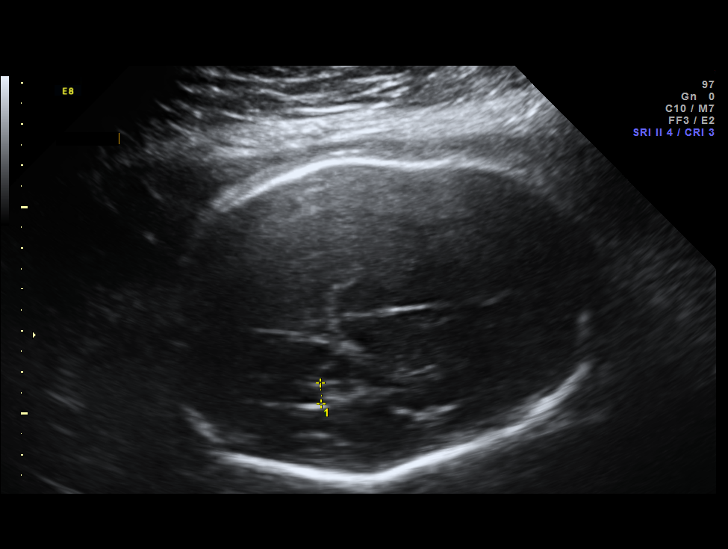
[im 49/56]
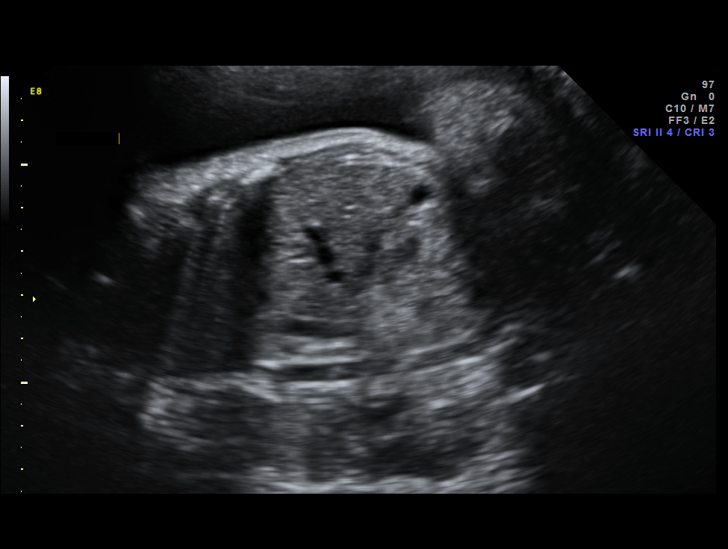
[im 53/56]
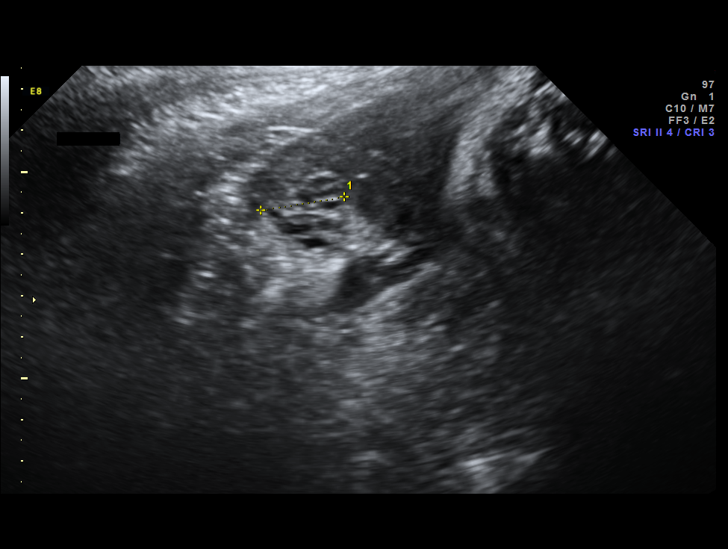

[12 of 28 positions shown; findings below may reference images not displayed]

OBSTETRICS REPORT
                      (Signed Final 08/14/2014 [DATE])

Service(s) Provided

 US OB FOLLOW UP                                       76816.1
Indications

 Follow-up incomplete fetal anatomic evaluation        Z36
 Obesity complicating pregnancy
 32 weeks gestation of pregnancy
Fetal Evaluation

 Num Of Fetuses:    1
 Fetal Heart Rate:  161                          bpm
 Cardiac Activity:  Observed
 Presentation:      Frank breech
 Placenta:          Posterior Fundal, above
                    cervical os

 Amniotic Fluid
 AFI FV:      Subjectively within normal limits
 AFI Sum:     11.7    cm       29  %Tile
Biometry

 BPD:     78.4  mm     G. Age:  31w 3d                CI:         70.5   70 - 86
 OFD:    111.2  mm                                    FL/HC:      19.9   19.1 -

 HC:     301.1  mm     G. Age:  33w 3d       47  %    HC/AC:      1.17   0.96 -

 AC:     258.1  mm     G. Age:  30w 0d        5  %    FL/BPD:     76.5   71 - 87
 FL:        60  mm     G. Age:  31w 1d       17  %    FL/AC:      23.2   20 - 24
 HUM:     54.4  mm     G. Age:  31w 4d       42  %
 CER:     39.1  mm     G. Age:  33w 1d       67  %

 Est. FW:    7949  gm    3 lb 10 oz      31  %
Gestational Age

 LMP:           32w 1d        Date:  01/01/14                 EDD:   10/08/14
 U/S Today:     31w 4d                                        EDD:   10/12/14
 Best:          32w 1d     Det. By:  LMP  (01/01/14)          EDD:   10/08/14
Anatomy

 Cranium:          Appears normal         Aortic Arch:      Previously seen
 Fetal Cavum:      Appears normal         Ductal Arch:      Not well visualized
 Ventricles:       Appears normal         Diaphragm:        Appears normal
 Choroid Plexus:   Previously seen        Stomach:          Appears normal, left
                                                            sided
 Cerebellum:       Appears normal         Abdomen:          Appears normal
 Posterior Fossa:  Appears normal         Abdominal Wall:   Previously seen
 Nuchal Fold:      Previously seen        Cord Vessels:     Previously seen
 Face:             Orbits and profile     Kidneys:          Appear normal
                   previously seen
 Lips:             Appears normal         Bladder:          Appears normal
 Heart:            Appears normal         Spine:            Not well visualized
                   (4CH, axis, and
                   situs)
 RVOT:             Appears normal         Lower             Previously seen
                                          Extremities:
 LVOT:             Appears normal         Upper             Previously seen
                                          Extremities:

 Other:  Male gender. Heels and Nasal bone previously visualized. Technically
         difficult due to maternal habitus and fetal position.
Cervix Uterus Adnexa

 Cervix:       Not visualized (advanced GA >57wks)

 Left Ovary:    Not visualized.
 Right Ovary:   Within normal limits.
Impression

 SIUP at 20w0d, maternal obesity
 EFW 31st%, AC 5th%'le
 No dysmorphic features but limitations as above
 No previa
Recommendations

 Recommend interval growth and attempt to complete fetal
 survey in 3-4 weeks given borderline lagging albeit
 technically normal AC.

 questions or concerns.

## 2015-08-18 ENCOUNTER — Ambulatory Visit (INDEPENDENT_AMBULATORY_CARE_PROVIDER_SITE_OTHER): Payer: Medicaid Other | Admitting: Obstetrics & Gynecology

## 2015-08-18 ENCOUNTER — Encounter: Payer: Self-pay | Admitting: Obstetrics & Gynecology

## 2015-08-18 VITALS — BP 132/82 | HR 87 | Resp 18 | Ht 69.0 in | Wt 261.0 lb

## 2015-08-18 DIAGNOSIS — R399 Unspecified symptoms and signs involving the genitourinary system: Secondary | ICD-10-CM | POA: Diagnosis not present

## 2015-08-18 DIAGNOSIS — N390 Urinary tract infection, site not specified: Secondary | ICD-10-CM

## 2015-08-18 DIAGNOSIS — R102 Pelvic and perineal pain: Secondary | ICD-10-CM | POA: Diagnosis not present

## 2015-08-18 LAB — POCT URINALYSIS DIPSTICK
Bilirubin, UA: NEGATIVE
Glucose, UA: NEGATIVE
NITRITE UA: NEGATIVE
PH UA: 5
PROTEIN UA: NEGATIVE
SPEC GRAV UA: 1.015
UROBILINOGEN UA: 0.2

## 2015-08-18 MED ORDER — CIPROFLOXACIN HCL 500 MG PO TABS: 500.0000 mg | ORAL_TABLET | Freq: Two times a day (BID) | ORAL | Status: DC

## 2015-08-18 NOTE — Progress Notes (Signed)
CLINIC ENCOUNTER NOTE  History:  24 y.o. G1P1001 here today for constant, moderate, lower pelvic pain that started a day ago and seems to get better with urination. Pain only alleviated by urination, no other alleviating or worsening factors. No fevers. She has Mirena IUD in place.  She denies any abnormal vaginal discharge, bleeding, pelvic pain or other concerns.   Past Medical History  Diagnosis Date  . Hypoglycemic syndrome   . Anxiety   . Depression   . Bipolar disorder (HCC)     no med currently  . Migraines     otc med prn    Past Surgical History  Procedure Laterality Date  . Tonsillectomy    . Wisdom tooth extraction    . Abscess tooth    . Cesarean section N/A 10/01/2014    Procedure: PRIMARY CESAREAN SECTION;  Surgeon: Adam Phenix, MD;  Location: [facility];  Service: Obstetrics;  Laterality: N/A;    The following portions of the patient's history were reviewed and updated as appropriate: allergies, current medications, past family history, past medical history, past social history, past surgical history and problem list.   Health Maintenance:  Normal pap and negative GC and Chlam on 03/06/2014.   Review of Systems:  Pertinent items noted in HPI and remainder of comprehensive ROS otherwise negative.  Objective:  Physical Exam BP 132/82 mmHg  Pulse 87  Resp 18  Ht  (1.753 m)  Wt 261 lb (118.389 kg)  BMI 38.53 kg/m2 CONSTITUTIONAL: Well-developed, well-nourished female in no acute distress.  HENT:  Normocephalic, atraumatic. External right and left ear normal. Oropharynx is clear and moist EYES: Conjunctivae and EOM are normal. Pupils are equal, round, and reactive to light. No scleral icterus.  NECK: Normal range of motion, supple, no masses SKIN: Skin is warm and dry. No rash noted. Not diaphoretic. No erythema. No pallor. NEUROLGIC: Alert and oriented to person, place, and time. Normal reflexes, muscle tone coordination. No cranial nerve deficit  noted. PSYCHIATRIC: Normal mood and affect. Normal behavior. Normal judgment and thought content. CARDIOVASCULAR: Normal heart rate noted RESPIRATORY: Effort and breath sounds normal, no problems with respiration noted ABDOMEN: Soft, no distention noted. Moderate suprapubic tenderness to palpation.   PELVIC: Deferred MUSCULOSKELETAL: Normal range of motion. No edema noted.  Labs and Imaging Results for orders placed or performed in visit on 08/18/15 (from the past 48 hour(s))  POCT Urinalysis Dipstick     Status: Abnormal   Collection Time: 08/18/15  3:54 PM  Result Value Ref Range   Color, UA Yellow    Clarity, UA Clear    Glucose, UA Negative    Bilirubin, UA Negative    Ketones, UA Trace    Spec Grav, UA 1.015    Blood, UA Trace    pH, UA 5.0    Protein, UA Negative    Urobilinogen, UA 0.2    Nitrite, UA Negative    Leukocytes, UA Trace (A) Negative    Assessment & Plan:  UTI symptoms Patient has symptoms concerning for possible UTI. Will follow up urine culture and start presumptive therapy. - POCT Urinalysis Dipstick - Urine Culture - ciprofloxacin (CIPRO) 500 MG tablet; Take 1 tablet (500 mg total) by mouth 2 (two) times daily.  Dispense: 6 tablet; Refill: 0 She was told to return/call back for worsening symptoms. Routine preventative health maintenance measures emphasized.   Total face-to-face time with patient: 15 minutes. Over 50% of encounter was spent on counseling and coordination of  care.   Jaynie CollinsUGONNA  Weslie Rasmus, MD, FACOG Attending Obstetrician & Gynecologist, [facility]

## 2015-08-20 LAB — URINE CULTURE: Colony Count: 25000

## 2017-07-18 ENCOUNTER — Encounter: Payer: Self-pay | Admitting: Obstetrics and Gynecology

## 2017-07-18 ENCOUNTER — Ambulatory Visit (INDEPENDENT_AMBULATORY_CARE_PROVIDER_SITE_OTHER): Payer: Medicaid Other | Admitting: Obstetrics and Gynecology

## 2017-07-18 ENCOUNTER — Other Ambulatory Visit (HOSPITAL_COMMUNITY)
Admission: RE | Admit: 2017-07-18 | Discharge: 2017-07-18 | Disposition: A | Discharge status: Home / Self Care | Payer: Medicaid Other | Source: Ambulatory Visit | Attending: Obstetrics and Gynecology | Admitting: Obstetrics and Gynecology

## 2017-07-18 VITALS — BP 127/83 | HR 85 | Wt 220.1 lb

## 2017-07-18 DIAGNOSIS — Z30432 Encounter for removal of intrauterine contraceptive device: Secondary | ICD-10-CM

## 2017-07-18 DIAGNOSIS — Z124 Encounter for screening for malignant neoplasm of cervix: Secondary | ICD-10-CM

## 2017-07-18 DIAGNOSIS — Z30011 Encounter for initial prescription of contraceptive pills: Secondary | ICD-10-CM

## 2017-07-18 DIAGNOSIS — Z3009 Encounter for other general counseling and advice on contraception: Secondary | ICD-10-CM

## 2017-07-18 MED ORDER — NORGESTIM-ETH ESTRAD TRIPHASIC 0.18/0.215/0.25 MG-25 MCG PO TABS: 1.0000 | ORAL_TABLET | Freq: Every day | ORAL | 3 refills | Status: DC

## 2017-07-18 NOTE — Patient Instructions (Signed)
Wait one week when starting the birth control pills before considering them effective.

## 2017-07-18 NOTE — Progress Notes (Addendum)
Obstetrics and Gynecology Established Patient Evaluation  Appointment Date: 07/18/2017  OBGYN Clinic: [facility]  Primary Care Provider: Dianne Payne  Chief Complaint:  Chief Complaint  Patient presents with  . IUD REMOVAL    History of Present Illness: Emily Payne is a 26 y.o. Caucasian G1P1001 (No LMP recorded. Patient is not currently having periods (Reason: IUD).), seen for the above chief complaint. Her past medical history is significant for nothing  Patient had Skyla placed at her PP visit back in 11/2014. She states that she doesn't have any problems or issues with it but just would like to switch back to OCPs which she was on in the past and had no issues with it. She thinks she may want to try to get pregnant sometime in the next few months to a year. Pt states she was on the OCP back when she was 26 y/o and has a h/o of depo (weight gain) and nuva ring use. She states her migraines with aura are much better than before but she still gets a migraine about once or twice a year and she does have aura with it.    No breast s/s, fevers, chills, chest pain, SOB, nausea, vomiting, abdominal pain, dysuria, hematuria, vaginal itching, dyspareunia, diarrhea, constipation, blood in BMs  Review of Systems: as noted in the History of Present Illness.  Past Medical History:  Past Medical History:  Diagnosis Date  . Anxiety   . Bipolar disorder (HCC)    no med currently  . Depression   . Hypoglycemic syndrome   . Migraines    otc med prn  . Poor fetal growth affecting management of mother in third trimester     Past Surgical History:  Past Surgical History:  Procedure Laterality Date  . abscess tooth    . CESAREAN SECTION N/A 10/01/2014   Procedure: PRIMARY CESAREAN SECTION;  Surgeon: Emily Phenix, MD;  Location: [facility];  Service: Obstetrics;  Laterality: N/A;  . TONSILLECTOMY    . WISDOM TOOTH EXTRACTION      Past Obstetrical History:   OB History  Gravida Para Term Preterm AB Living  1 1 1  0 0 1  SAB TAB Ectopic Multiple Live Births  0 0 0 0 1    # Outcome Date GA Lbr Len/2nd Weight Sex Delivery Anes PTL Lv  1 Term 10/01/14 [redacted]w[redacted]d  6 lb 13.4 oz (3.1 kg) M CS-LTranv Spinal  LIV      Past Gynecological History: As per HPI. Periods: none History of Pap Smear(s): Yes.   Last pap 2015, which was NILM History of STI(s): No. She is currently using Skyla for contraception.   Social History:  Social History   Social History  . Marital status: Married    Spouse name: N/A  . Number of children: N/A  . Years of education: N/A   Occupational History  . Not on file.   Social History Main Topics  . Smoking status: Never Smoker  . Smokeless tobacco: Never Used  . Alcohol use 0.0 oz/week     Comment: rare but none with pregnancy  . Drug use: No  . Sexual activity: Yes    Partners: Male    Birth control/ protection: None     Comment: pregnant   Other Topics Concern  . Not on file   Social History Narrative   G0- engaged.   Works at [employer] but also attends [employer].   Has two step children-  ages 154 and 749.    Family History:  Family History  Problem Relation Age of Onset  . Ovarian cysts Mother   . Stroke Maternal Grandmother   . Cancer Maternal Grandmother 27       uterine cancer     Medications [name] had no medications administered during this visit. Current Outpatient Prescriptions  Medication Sig Dispense Refill  . prenatal vitamin w/FE, FA (NATACHEW) 29-1 MG CHEW chewable tablet Chew 1 tablet by mouth daily at 12 noon. 30 tablet 5  . sertraline (ZOLOFT) 25 MG tablet Take 25 mg by mouth daily.    . ciprofloxacin (CIPRO) 500 MG tablet Take 1 tablet (500 mg total) by mouth 2 (two) times daily. (Patient not taking: Reported on 07/18/2017) 6 tablet 0  . Skyla      No current facility-administered medications for this visit.     Allergies Sulfa drugs cross  reactors   Physical Exam:  BP 127/83   Pulse 85   Wt 220 lb 1.6 oz (99.8 kg)   BMI 32.50 kg/m  Body mass index is 32.5 kg/m. General appearance: Well nourished, well developed female in no acute distress.  Neck:  Supple, normal appearance, and no thyromegaly  Cardiovascular: normal s1 and s2.  No murmurs, rubs or gallops. Respiratory:  Clear to auscultation bilateral. Normal respiratory effort Abdomen: positive bowel sounds and no masses, hernias; diffusely non tender to palpation, non distended Neuro/Psych:  Normal mood and affect.  Skin:  Warm and dry.  Lymphatic:  No inguinal lymphadenopathy.   Pelvic exam: is not limited by body habitus EGBUS: within normal limits, Vagina: within normal limits and with no blood or discharge in the vault, Cervix: normal appearing cervix without tenderness, discharge or lesions. IUD strings seen coming from os. approx 3cm in length. Uterus:  nonenlarged and non tender and Adnexa:  normal adnexa and no mass, fullness, tenderness Rectovaginal: deferred  See procedure note for uncomplicated IUD removal  Laboratory: none  Radiology: none  Assessment: pt doing well  Plan:  1. Cervical cancer screening - Cytology - PAP  2. Encounter for removal of intrauterine contraceptive device (IUD)  3. Encounter for initial prescription of contraceptive pills R/b/a d/w pt and she'd like to go back on pills. She was on ortho lo tricyclen and would like to get back on it, she'll stop the regular OCP and switch to mini pill. Since she does have migraines with aura I told her I wouldn't recommend an option that has estrogen . Options d/w her and she'd like to do the mini pill. Instructions on use given and Rx given and pt told to wait 7d before considering effective. Pt told to start folic acid a few months before trying to conceive and stopping the OCPs, which she thinks she may want to do in possibly a year (above is an addendum from 11/1)  RTC PRN  Emily Payne  Emily Payne, Jr MD Attending [facility](Faculty Practice)

## 2017-07-19 ENCOUNTER — Telehealth: Payer: Self-pay | Admitting: Radiology

## 2017-07-19 NOTE — Telephone Encounter (Signed)
Called patient to inform her that we will be able to write a note for her husband stating that she is high risk pregnancy, and also that she needs to give [facility] the forms that she needs to be completed. Unable to leave a message on the voicemail due to it being full.

## 2017-07-21 ENCOUNTER — Encounter: Payer: Self-pay | Admitting: Obstetrics and Gynecology

## 2017-07-21 LAB — CYTOLOGY - PAP: Diagnosis: NEGATIVE

## 2017-07-21 MED ORDER — NORETHINDRONE 0.35 MG PO TABS: 1.0000 | ORAL_TABLET | Freq: Every day | ORAL | 3 refills | Status: DC

## 2017-07-21 NOTE — Procedures (Signed)
Intrauterine Device (IUD) Removal Procedure Note   Prior to the procedure being performed, the patient (or guardian) was asked to state their full name, date of birth, and the type of procedure being performed. EGBUS normal. Vaginal vault normal. Cervix normal with IUD strings seen (approx 3cm in length). Strings grasped with ringed forceps and easily removed and noted to be intact.   No complications, patient tolerated the procedure well.  Pt to start OCPs  [place] Bingharlie Fletcher Rathbun, Montez HagemanJr MD Attending [facility](Faculty Practice)

## 2017-07-21 NOTE — Addendum Note (Signed)
Addended by: Hardin BingPICKENS, Yatzary Merriweather on: 07/21/2017 04:35 PM   Modules accepted: Orders

## 2018-06-08 ENCOUNTER — Other Ambulatory Visit: Payer: Self-pay

## 2018-06-08 ENCOUNTER — Emergency Department: Payer: Medicaid Other

## 2018-06-08 ENCOUNTER — Emergency Department
Admission: EM | Admit: 2018-06-08 | Discharge: 2018-06-08 | Disposition: A | Discharge status: Home / Self Care | Payer: Medicaid Other | Attending: Emergency Medicine | Admitting: Emergency Medicine

## 2018-06-08 DIAGNOSIS — F419 Anxiety disorder, unspecified: Secondary | ICD-10-CM | POA: Insufficient documentation

## 2018-06-08 DIAGNOSIS — R0602 Shortness of breath: Secondary | ICD-10-CM | POA: Diagnosis not present

## 2018-06-08 DIAGNOSIS — R0789 Other chest pain: Secondary | ICD-10-CM | POA: Diagnosis not present

## 2018-06-08 DIAGNOSIS — R079 Chest pain, unspecified: Secondary | ICD-10-CM | POA: Diagnosis present

## 2018-06-08 LAB — POCT PREGNANCY, URINE: PREG TEST UR: NEGATIVE

## 2018-06-08 LAB — BASIC METABOLIC PANEL
ANION GAP: 12 (ref 5–15)
BUN: 14 mg/dL (ref 6–20)
CHLORIDE: 102 mmol/L (ref 98–111)
CO2: 26 mmol/L (ref 22–32)
Calcium: 10 mg/dL (ref 8.9–10.3)
Creatinine, Ser: 0.86 mg/dL (ref 0.44–1.00)
GFR calc Af Amer: >60 mL/min (ref >60)
GFR calc non Af Amer: >60 mL/min (ref >60)
Glucose, Bld: 99 mg/dL (ref 70–99)
Potassium: 4.2 mmol/L (ref 3.5–5.1)
SODIUM: 140 mmol/L (ref 135–145)

## 2018-06-08 LAB — CBC
HCT: 46.3 % (ref 35.0–47.0)
HEMOGLOBIN: 16.1 g/dL — AB (ref 12.0–16.0)
MCH: 31.3 pg (ref 26.0–34.0)
MCHC: 34.8 g/dL (ref 32.0–36.0)
MCV: 90 fL (ref 80.0–100.0)
Platelets: 311 10*3/uL (ref 150–440)
RBC: 5.14 MIL/uL (ref 3.80–5.20)
RDW: 12.5 % (ref 11.5–14.5)
WBC: 12.8 10*3/uL — ABNORMAL HIGH (ref 3.6–11.0)

## 2018-06-08 LAB — FIBRIN DERIVATIVES D-DIMER (ARMC ONLY): Fibrin derivatives D-dimer (ARMC): 126.35 ng/mL (FEU) (ref 0.00–499.00)

## 2018-06-08 LAB — TROPONIN I: Troponin I: <0.03 ng/mL (ref <0.03)

## 2018-06-08 MED ORDER — LORAZEPAM 0.5 MG PO TABS
0.5000 mg | ORAL_TABLET | Freq: Once | ORAL | Status: AC
  Administered 2018-06-08: 0.5 mg via ORAL
  Filled 2018-06-08: qty 1

## 2018-06-08 MED ORDER — LORAZEPAM 1 MG PO TABS: 1.0000 mg | ORAL_TABLET | Freq: Two times a day (BID) | ORAL | 0 refills | Status: DC

## 2018-06-08 NOTE — ED Triage Notes (Signed)
Pt c/o intermittent chest pain with SOB for the past couple of days. Pt is anxious with c/o tingling in hands and face in triage. Denies any increased stress lately..Marland Kitchen

## 2018-06-08 NOTE — ED Provider Notes (Signed)
[facility] Emergency Department Provider Note       Time seen: ----------------------------------------- 2:36 PM on 06/08/2018 -----------------------------------------   I have reviewed the triage vital signs and the nursing notes.  HISTORY   Chief Complaint Chest Pain and Anxiety    HPI Emily Payne is a 27 y.o. female with a history of anxiety, bipolar disorder, depression, migraines who presents to the ED for chest pain shortness of breath for the past several days.  Patient is anxious with tingling in her hands and face on arrival as noted by the triage nurse.  She denies any increase in her stress.  She denies any recent illness or change in her medications.  She does take birth control.  Past Medical History:  Diagnosis Date  . Anxiety   . Bipolar disorder (HCC)    no med currently  . Depression   . Hypoglycemic syndrome   . Migraines    otc med prn. with aura   . Poor fetal growth affecting management of mother in third trimester     Patient Active Problem List   Diagnosis Date Noted  . Depression 02/21/2015  . Dandruff 06/22/2013  . Bipolar disorder, unspecified (HCC) 06/22/2013  . Obesity 07/04/2012    Past Surgical History:  Procedure Laterality Date  . abscess tooth    . CESAREAN SECTION N/A 10/01/2014   Procedure: PRIMARY CESAREAN SECTION;  Surgeon: Adam PhenixJames G Arnold, MD;  Location: [facility];  Service: Obstetrics;  Laterality: N/A;  . TONSILLECTOMY    . WISDOM TOOTH EXTRACTION      Allergies Sulfa drugs cross reactors  Social History Social History   Tobacco Use  . Smoking status: Never Smoker  . Smokeless tobacco: Never Used  Substance Use Topics  . Alcohol use: Yes    Alcohol/week: 0.0 standard drinks    Comment: rare but none with pregnancy  . Drug use: No   Review of Systems Constitutional: Negative for fever. Cardiovascular: Positive for chest pain Respiratory: Positive for shortness of breath Gastrointestinal:  Negative for abdominal pain, vomiting and diarrhea. Musculoskeletal: Negative for back pain. Skin: Negative for rash. Neurological: Negative for headaches, focal weakness or numbness.  All systems negative/normal/unremarkable except as stated in the HPI  ____________________________________________   PHYSICAL EXAM:  VITAL SIGNS: ED Triage Vitals  Enc Vitals Group     BP 06/08/18 1221 (!) 165/109     Pulse Rate 06/08/18 1221 86     Resp 06/08/18 1221 (!) 22     Temp 06/08/18 1221 98.5 F (36.9 C)     Temp Source 06/08/18 1221 Oral     SpO2 06/08/18 1221 100 %     Weight 06/08/18 1222 213 lb (96.6 kg)     Height 06/08/18 1222 5\' 9"  (1.753 m)     Head Circumference --      Peak Flow --      Pain Score 06/08/18 1222 7     Pain Loc --      Pain Edu? --      Excl. in GC? --    Constitutional: Alert and oriented.  Anxious, mild distress Eyes: Conjunctivae are normal. Normal extraocular movements. ENT   Head: Normocephalic and atraumatic.   Nose: No congestion/rhinnorhea.   Mouth/Throat: Mucous membranes are moist.   Neck: No stridor. Cardiovascular: Normal rate, regular rhythm. No murmurs, rubs, or gallops. Respiratory: Normal respiratory effort without tachypnea nor retractions. Breath sounds are clear and equal bilaterally. No wheezes/rales/rhonchi. Gastrointestinal: Soft and nontender. Normal  bowel sounds Musculoskeletal: Nontender with normal range of motion in extremities. No lower extremity tenderness nor edema. Neurologic:  Normal speech and language. No gross focal neurologic deficits are appreciated.  Skin:  Skin is warm, dry and intact. No rash noted. Psychiatric: Mood and affect are normal. Speech and behavior are normal.  ____________________________________________  EKG: Interpreted by me.  Sinus rhythm the rate of 97 bpm, normal PR interval, normal QRS, normal QT, nonspecific T wave flattening is  noted  ____________________________________________  ED COURSE:  As part of my medical decision making, I reviewed the following data within the electronic MEDICAL RECORD NUMBER History obtained from family if available, nursing notes, old chart and ekg, as well as notes from prior ED visits. Patient presented for chest pain shortness of breath, we will assess with labs and imaging as indicated at this time.   Procedures ____________________________________________   LABS (pertinent positives/negatives)  Labs Reviewed  CBC - Abnormal; Notable for the following components:      Result Value   WBC 12.8 (*)    Hemoglobin 16.1 (*)    All other components within normal limits  BASIC METABOLIC PANEL  TROPONIN I  FIBRIN DERIVATIVES D-DIMER ([facility] ONLY)  POCT PREGNANCY, URINE  POC URINE PREG, ED    RADIOLOGY Images were viewed by me  Chest x-ray is normal  ____________________________________________  DIFFERENTIAL DIAGNOSIS   Anxiety, GERD, musculoskeletal pain, unstable angina, PE  FINAL ASSESSMENT AND PLAN  Chest pain   Plan: The patient had presented for chest pain of uncertain etiology however likely anxiety related. Patient's labs assuring. Patient's imaging did not reveal any acute process.  We did give her Ativan here and she will be discharged home with similar and close outpatient follow-up.  There were some EKG changes but she is low risk for ACS.   Ulice Dash, MD   Note: This note was generated in part or whole with voice recognition software. Voice recognition is usually quite accurate but there are transcription errors that can and very often do occur. I apologize for any typographical errors that were not detected and corrected.     Emily Filbert, MD 06/08/18 1438

## 2018-07-10 ENCOUNTER — Other Ambulatory Visit: Payer: Self-pay | Admitting: *Deleted

## 2018-07-10 MED ORDER — NORETHINDRONE 0.35 MG PO TABS: 1.0000 | ORAL_TABLET | Freq: Every day | ORAL | 3 refills | Status: DC

## 2018-08-31 ENCOUNTER — Other Ambulatory Visit: Payer: Self-pay | Admitting: Family Medicine

## 2018-08-31 ENCOUNTER — Ambulatory Visit: Payer: Medicaid Other | Admitting: Family Medicine

## 2018-08-31 ENCOUNTER — Encounter: Payer: Self-pay | Admitting: Family Medicine

## 2018-08-31 ENCOUNTER — Telehealth: Payer: Self-pay | Admitting: Family Medicine

## 2018-08-31 VITALS — BP 120/66 | HR 99 | Temp 98.2°F | Ht 69.5 in | Wt 214.6 lb

## 2018-08-31 DIAGNOSIS — E6609 Other obesity due to excess calories: Secondary | ICD-10-CM | POA: Diagnosis not present

## 2018-08-31 DIAGNOSIS — F4010 Social phobia, unspecified: Secondary | ICD-10-CM | POA: Diagnosis not present

## 2018-08-31 DIAGNOSIS — F411 Generalized anxiety disorder: Secondary | ICD-10-CM | POA: Diagnosis not present

## 2018-08-31 DIAGNOSIS — Z5181 Encounter for therapeutic drug level monitoring: Secondary | ICD-10-CM

## 2018-08-31 DIAGNOSIS — L210 Seborrhea capitis: Secondary | ICD-10-CM | POA: Diagnosis not present

## 2018-08-31 DIAGNOSIS — Z6831 Body mass index (BMI) 31.0-31.9, adult: Secondary | ICD-10-CM

## 2018-08-31 DIAGNOSIS — F1990 Other psychoactive substance use, unspecified, uncomplicated: Secondary | ICD-10-CM

## 2018-08-31 DIAGNOSIS — F410 Panic disorder [episodic paroxysmal anxiety] without agoraphobia: Secondary | ICD-10-CM | POA: Insufficient documentation

## 2018-08-31 MED ORDER — LORAZEPAM 1 MG PO TABS: 0.5000 mg | ORAL_TABLET | Freq: Three times a day (TID) | ORAL | 0 refills | Status: DC | PRN

## 2018-08-31 MED ORDER — ESCITALOPRAM OXALATE 10 MG PO TABS: 10.0000 mg | ORAL_TABLET | Freq: Every day | ORAL | 0 refills | Status: DC

## 2018-08-31 MED ORDER — BETAMETHASONE DIPROPIONATE AUG 0.05 % EX LOTN: TOPICAL_LOTION | Freq: Two times a day (BID) | CUTANEOUS | 3 refills | Status: DC

## 2018-08-31 NOTE — Assessment & Plan Note (Signed)
Treat with diprolene PRN

## 2018-08-31 NOTE — Progress Notes (Signed)
BP 120/66   Pulse 99   Temp 98.2 F (36.8 C)   Ht 5' 9.5" (1.765 m)   Wt 214 lb 9.6 oz (97.3 kg)   LMP 08/20/2018 (Approximate)   SpO2 99%   Breastfeeding No   BMI 31.24 kg/m    Subjective:    Patient ID: Emily Payne, female    DOB: [birth year], 27 y.o.   MRN: [id number]  HPI: Emily Payne is a 27 y.o. female  Chief Complaint  Patient presents with  . New Patient (Initial Visit)    HPI She is here to establish care She has a lot of anxiety She was seeing another doctor but wasn't comfortable at the office The other provider told her life is full of stress and try to deal with it Other would just do checklist and give her medicine She had the worst panic attack 2 months ago and went to the ER; got checked out She had a few before that but thought she was being dramatic He gave her ativan and brought them with her; they make her sleepy She has not had any more issues that were that bad since the hospital but just knowing that is an option is helpful She thinks she has been having mild panic attacks for a few months Always had anxiety, way back to age 78; concerned about how people Pretty sure mother and grandmother do have anxiety; grandmother has issues and is on xanax She is not taking sertraline any more; it was "okay"; no bad reaction; had to keep coming back in and they had to increase the dose; he switched her to wellbutrin; then  Stopped it Grandfather died in 2022/10/06 and grandmother has been hospitalized and is concerned that may be coming; high school friend died; worried about children's health, death issues Social anxiety; no trouble eating and wouldn't want to speak especially; gets nervous but can do it  She was diagnosed with bipolar disorder at age 89; he asked if she fit the symptoms of bipolar d/o and she thought so, and he put her on lamictal and some big horsepill, she alternated those; no episodes of mania or hypomania; her long-term boyfriend broke up  with her and she was 72 years old; she does not think she has bipolar; she saw another psych and he agreed that she does not have bipolar d/o and was social anxiety d/o and general anxiety d/o  Some dandruff; some scalp psoriasis and it forms a plaque and gets itchy and gets the white scale; hair feels gross; comes back; no skin lesions  No red flags on PMP Aware; just one Rx from ER doctor a few months back for lorazepam  Seeing GYN for wellness visits  She has lost 60 pounds over the last 2 years ago; she gave up soda; not skipping meals anymore  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 she tells me at the end of the visit when discussing controlled substances; our CMA was told no recreational or illicit drugs  Depression screen [facility] 2/9 08/31/2018 09/11/2014 08/14/2014  Decreased Interest 1 0 0  Down, Depressed, Hopeless 1 0 0  PHQ - 2 Score 2 0 0  Altered sleeping 3 - -  Tired, decreased energy 3 - -  Change in appetite 1 - -  Feeling bad or failure about yourself  0 - -  Trouble concentrating 0 - -  Moving slowly or fidgety/restless 3 - -  Suicidal thoughts 0 - -  PHQ-9 Score 12 - -  Difficult doing work/chores Not difficult at all - -   Fall Risk  08/31/2018 09/11/2014 08/14/2014  Falls in the past year? 0 No No    Relevant past medical, surgical, family and social history reviewed Past Medical History:  Diagnosis Date  . Anxiety   . Bipolar disorder (HCC)    no med currently  . Depression   . Hypoglycemic syndrome   . Migraines    otc med prn. with aura   . Poor fetal growth affecting management of mother in third trimester    Past Surgical History:  Procedure Laterality Date  . abscess tooth    . CESAREAN SECTION N/A 10/01/2014   Procedure: PRIMARY CESAREAN SECTION;  Surgeon: Adam PhenixJames G Arnold, MD;  Location: [facility];  Service: Obstetrics;  Laterality: N/A;  . TONSILLECTOMY    . WISDOM TOOTH EXTRACTION     Family History  Problem Relation Age of Onset  . Ovarian cysts Mother    . Stroke Maternal Grandmother   . Cancer Maternal Grandmother 27       uterine cancer  . Lung cancer Paternal Grandfather   . Diabetes Maternal Great-grandmother   . Lung cancer Maternal Great-grandmother    Social History   Tobacco Use  . Smoking status: Never Smoker  . Smokeless tobacco: Never Used  Substance Use Topics  . Alcohol use: Yes    Alcohol/week: 0.0 standard drinks    Comment: rare but none with pregnancy  . Drug use: No     [facility]  AUDIT-C Score  2      Interim medical history since last visit reviewed. Allergies and medications reviewed  Review of Systems Per HPI unless specifically indicated above     Objective:    BP 120/66   Pulse 99   Temp 98.2 F (36.8 C)   Ht 5' 9.5" (1.765 m)   Wt 214 lb 9.6 oz (97.3 kg)   LMP 08/20/2018 (Approximate)   SpO2 99%   Breastfeeding No   BMI 31.24 kg/m   Wt Readings from Last 3 Encounters:  08/31/18 214 lb 9.6 oz (97.3 kg)  06/08/18 213 lb (96.6 kg)  07/18/17 220 lb 1.6 oz (99.8 kg)    Physical Exam Constitutional:      Appearance: She is well-developed. She is obese. She is not ill-appearing.  Eyes:     General: No scleral icterus. Cardiovascular:     Rate and Rhythm: Normal rate and regular rhythm.  Pulmonary:     Effort: Pulmonary effort is normal.     Breath sounds: Normal breath sounds.  Neurological:     Mental Status: She is alert.  Psychiatric:        Mood and Affect: Mood is anxious. Mood is not depressed. Affect is tearful (briefly). Affect is not flat.        Speech: Speech is not slurred.        Behavior: Behavior normal. Behavior is not slowed.     Comments: Briefly tearful in an almost relief sort of expression when discussing her symptoms and previous experiences        Assessment & Plan:   Problem List Items Addressed This Visit      Musculoskeletal and Integument   Dandruff    Treat with diprolene PRN        Other    Obesity (Chronic)    Encouraged weight loss; proud of her effrts to lose weight, and she has been  successful with getting 60 pounds off thus far; see AVS      Illicit drug use    I appreciate patient being honest with me about the fact that something would show up in her urine drug screen; however, I explained that she will need to stop that and have a clean UDS; it may take 28-30 days for [facility] to get out of the body; I am willing to repeat at that time; will be starting lexapro for anxiety; I contact the pharmacy and CANCELED the Rx for the benzo      Generalized anxiety disorder with panic attacks - Primary (Chronic)    Start lexapro; cautioned about mania or hypomania; call right away if that happens; refer to counselor      Relevant Medications   escitalopram (LEXAPRO) 10 MG tablet   Other Relevant Orders   Ambulatory referral to Psychology    Other Visit Diagnoses    Social phobia       Relevant Medications   escitalopram (LEXAPRO) 10 MG tablet   Other Relevant Orders   Ambulatory referral to Psychology   Medication monitoring encounter       Relevant Orders   Urine Drug Screen w/Alc, no confirm       Follow up plan: Return in about 3 weeks (around 09/21/2018) for follow-up visit with Dr. Sherie Don.  An after-visit summary was printed and given to the patient at check-out.  Please see the patient instructions which may contain other information and recommendations beyond what is mentioned above in the assessment and plan.  Meds ordered this encounter  Medications  . betamethasone, augmented, (DIPROLENE) 0.05 % lotion    Sig: Apply topically 2 (two) times daily. Too strong for face; use as needed on scalp    Dispense:  60 mL    Refill:  3  . DISCONTD: LORazepam (ATIVAN) 1 MG tablet    Sig: Take 0.5-1 tablets (0.5-1 mg total) by mouth every 8 (eight) hours as needed for anxiety.    Dispense:  20 tablet    Refill:  0  . escitalopram (LEXAPRO) 10 MG tablet    Sig: Take 1  tablet (10 mg total) by mouth daily.    Dispense:  30 tablet    Refill:  0  . DISCONTD: LORazepam (ATIVAN) 1 MG tablet    Sig: Take 0.5-1 tablets (0.5-1 mg total) by mouth every 8 (eight) hours as needed for anxiety.    Orders Placed This Encounter  Procedures  . Urine Drug Screen w/Alc, no confirm  . Ambulatory referral to Psychology   Addendum: I called pharmacy and canceled lorazepam, at 7:58 pm with Judeth Cornfield pharmacist; pending results of UDS Called in the Lexapro to pharmacist

## 2018-08-31 NOTE — Telephone Encounter (Signed)
Copied from CRM [phone]. Topic: Quick Communication - See Telephone Encounter >> Aug 31, 2018 12:52 PM Arlyss Gandyichardson, Emily Payne, NT wrote: CRM for notification. See Telephone encounter for: 08/31/18. Pt states during her appt today Dr. Sherie DonLada mentioned calling in Lexapro for her to start in the morning. The pharmacy does not have this rx. Please advise.

## 2018-08-31 NOTE — Assessment & Plan Note (Signed)
Start lexapro; cautioned about mania or hypomania; call right away if that happens; refer to counselor

## 2018-08-31 NOTE — Assessment & Plan Note (Addendum)
Encouraged weight loss; proud of her effrts to lose weight, and she has been successful with getting 60 pounds off thus far; see AVS

## 2018-08-31 NOTE — Patient Instructions (Addendum)
Do start the Lexapro (escitalopram) and take in the morning Use the lorazepam if needed We'll have you see the psychologist   12 Ways to Curb Anxiety  ?Anxiety is normal human sensation. It is what helped our ancestors survive the pitfalls of the wilderness. Anxiety is defined as experiencing worry or nervousness about an imminent event or something with an uncertain outcome. It is a feeling experienced by most people at some point in their lives. Anxiety can be triggered by a very personal issue, such as the illness of a loved one, or an event of global proportions, such as a refugee crisis. Some of the symptoms of anxiety are:  Feeling restless.  Having a feeling of impending danger.  Increased heart rate.  Rapid breathing. Sweating.  Shaking.  Weakness or feeling tired.  Difficulty concentrating on anything except the current worry.  Insomnia.  Stomach or bowel problems. What can we do about anxiety we may be feeling? There are many techniques to help manage stress and relax. Here are 12 ways you can reduce your anxiety almost immediately: 1. Turn off the constant feed of information. Take a social media sabbatical. Studies have shown that social media directly contributes to social anxiety.  2. Monitor your television viewing habits. Are you watching shows that are also contributing to your anxiety, such as 24-hour news stations? Try watching something else, or better yet, nothing at all. Instead, listen to music, read an inspirational book or practice a hobby. 3. Eat nutritious meals. Also, don't skip meals and keep healthful snacks on hand. Hunger and poor diet contributes to feeling anxious. 4. Sleep. Sleeping on a regular schedule for at least seven to eight hours a night will do wonders for your outlook when you are awake. 5. Exercise. Regular exercise will help rid your body of that anxious energy and help you get more restful sleep. 6. Try deep (diaphragmatic) breathing. Inhale  slowly through your nose for five seconds and exhale through your mouth. 7. Practice acceptance and gratitude. When anxiety hits, accept that there are things out of your control that shouldn't be of immediate concern.  8. Seek out humor. When anxiety strikes, watch a funny video, read jokes or call a friend who makes you laugh. Laughter is healing for our bodies and releases endorphins that are calming. 9. Stay positive. Take the effort to replace negative thoughts with positive ones. Try to see a stressful situation in a positive light. Try to come up with solutions rather than dwelling on the problem. 10. Figure out what triggers your anxiety. Keep a journal and make note of anxious moments and the events surrounding them. This will help you identify triggers you can avoid or even eliminate. 11. Talk to someone. Let a trusted friend, family member or even trained professional know that you are feeling overwhelmed and anxious. Verbalize what you are feeling and why.  12. Volunteer. If your anxiety is triggered by a crisis on a large scale, become an advocate and work to resolve the problem that is causing you unease. Anxiety is often unwelcome and can become overwhelming. If not kept in check, it can become a disorder that could require medical treatment. However, if you take the time to care for yourself and avoid the triggers that make you anxious, you will be able to find moments of relaxation and clarity that make your life much more enjoyable.   Check out the information at familydoctor.org entitled "Nutrition for Weight Loss: What You Need to  Know about Fad Diets" Try to lose between 1-2 pounds per week by taking in fewer calories and burning off more calories You can succeed by limiting portions, limiting foods dense in calories and fat, becoming more active, and drinking 8 glasses of water a day (64 ounces) Don't skip meals, especially breakfast, as skipping meals may alter your metabolism Do  not use over-the-counter weight loss pills or gimmicks that claim rapid weight loss A healthy BMI (or body mass index) is between 18.5 and 24.9 You can calculate your ideal BMI at the NIH website JobEconomics.hu   Preventing Unhealthy Weight Gain, Adult Staying at a healthy weight is important. When fat builds up in your body, you may become overweight or obese. These conditions put you at greater risk for developing certain health problems, such as heart disease, diabetes, sleeping problems, joint problems, and some cancers. Unhealthy weight gain is often the result of making unhealthy choices in what you eat. It is also a result of not getting enough exercise. You can make changes to your lifestyle to prevent obesity and stay as healthy as possible. What nutrition changes can be made? To maintain a healthy weight and prevent obesity:  Eat only as much as your body needs. To do this: ? Pay attention to signs that you are hungry or full. Stop eating as soon as you feel full. ? If you feel hungry, try drinking water first. Drink enough water so your urine is clear or pale yellow. ? Eat smaller portions. ? Look at serving sizes on food labels. Most foods contain more than one serving per container. ? Eat the recommended amount of calories for your gender and activity level. While most active people should eat around 2,000 calories per day, if you are trying to lose weight or are not very active, you main need to eat less calories. Talk to your health care provider or dietitian about how many calories you should eat each day.  Choose healthy foods, such as: ? Fruits and vegetables. Try to fill at least half of your plate at each meal with fruits and vegetables. ? Whole grains, such as whole wheat bread, brown rice, and quinoa. ? Lean meats, such as chicken or fish. ? Other healthy proteins, such as beans, eggs, or tofu. ? Healthy fats, such as  nuts, seeds, fatty fish, and olive oil. ? Low-fat or fat-free dairy.  Check food labels and avoid food and drinks that: ? Are high in calories. ? Have added sugar. ? Are high in sodium. ? Have saturated fats or trans fats.  Limit how much you eat of the following foods: ? Prepackaged meals. ? Fast food. ? Fried foods. ? Processed meat, such as bacon, sausage, and deli meats. ? Fatty cuts of red meat and poultry with skin.  Cook foods in healthier ways, such as by baking, broiling, or grilling.  When grocery shopping, try to shop around the outside of the store. This helps you buy mostly fresh foods and avoid canned and prepackaged foods.  What lifestyle changes can be made?  Exercise at least 30 minutes 5 or more days each week. Exercising includes brisk walking, yard work, biking, running, swimming, and team sports like basketball and soccer. Ask your health care provider which exercises are safe for you.  Do not use any products that contain nicotine or tobacco, such as cigarettes and e-cigarettes. If you need help quitting, ask your health care provider.  Limit alcohol intake to no more than 1  drink a day for nonpregnant women and 2 drinks a day for men. One drink equals 12 oz of beer, 5 oz of wine, or 1 oz of hard liquor.  Try to get 7-9 hours of sleep each night. What other changes can be made?  Keep a food and activity journal to keep track of: ? What you ate and how many calories you had. Remember to count sauces, dressings, and side dishes. ? Whether you were active, and what exercises you did. ? Your calorie, weight, and activity goals.  Check your weight regularly. Track any changes. If you notice you have gained weight, make changes to your diet or activity routine.  Avoid taking weight-loss medicines or supplements. Talk to your health care provider before starting any new medicine or supplement.  Talk to your health care provider before trying any new diet or  exercise plan. Why are these changes important? Eating healthy, staying active, and having healthy habits not only help prevent obesity, they also:  Help you to manage stress and emotions.  Help you to connect with friends and family.  Improve your self-esteem.  Improve your sleep.  Prevent long-term health problems.  What can happen if changes are not made? Being obese or overweight can cause you to develop joint or bone problems, which can make it hard for you to stay active or do activities you enjoy. Being obese or overweight also puts stress on your heart and lungs and can lead to health problems like diabetes, heart disease, and some cancers. Where to find more information: Talk with your health care provider or a dietitian about healthy eating and healthy lifestyle choices. You may also find other information through these resources:  U.S. Department of Agriculture MyPlate: https://ball-collins.biz/www.choosemyplate.gov  American Heart Association: www.heart.org  Centers for Disease Control and Prevention: FootballExhibition.com.brwww.cdc.gov  Summary  Staying at a healthy weight is important. It helps prevent certain diseases and health problems, such as heart disease, diabetes, joint problems, sleep disorders, and some cancers.  Being obese or overweight can cause you to develop joint or bone problems, which can make it hard for you to stay active or do activities you enjoy.  You can prevent unhealthy weight gain by eating a healthy diet, exercising regularly, not smoking, limiting alcohol, and getting enough sleep.  Talk with your health care provider or a dietitian for guidance about healthy eating and healthy lifestyle choices. This information is not intended to replace advice given to you by your health care provider. Make sure you discuss any questions you have with your health care provider. Document Released: 09/07/2016 Document Revised: 10/13/2016 Document Reviewed: 10/13/2016 Elsevier Interactive Patient  Education  [employer]2018 Elsevier Inc.

## 2018-09-01 ENCOUNTER — Other Ambulatory Visit: Payer: Self-pay | Admitting: Family Medicine

## 2018-09-01 LAB — DRUG SCREEN URINE W/ALC, NO CONF
ALCOHOL, ETHYL (U): NEGATIVE
AMPHETAMINES (1000 NG/ML SCRN): NEGATIVE
BARBITURATES: NEGATIVE
BENZODIAZEPINES: NEGATIVE
COCAINE METABOLITES: NEGATIVE
MARIJUANA MET (50 NG/ML SCRN): POSITIVE — AB
METHADONE: NEGATIVE
METHAQUALONE: NEGATIVE
OPIATES: NEGATIVE
PHENCYCLIDINE: NEGATIVE
PROPOXYPHENE: NEGATIVE

## 2018-09-01 MED ORDER — LORAZEPAM 1 MG PO TABS: 0.5000 mg | ORAL_TABLET | Freq: Three times a day (TID) | ORAL | Status: DC | PRN

## 2018-09-01 NOTE — Telephone Encounter (Signed)
Called pt to notify Dr. Sherie DonLada called in

## 2018-09-02 DIAGNOSIS — F1990 Other psychoactive substance use, unspecified, uncomplicated: Secondary | ICD-10-CM

## 2018-09-02 HISTORY — DX: Other psychoactive substance use, unspecified, uncomplicated: F19.90

## 2018-09-02 NOTE — Assessment & Plan Note (Signed)
I appreciate patient being honest with me about the fact that something would show up in her urine drug screen; however, I explained that she will need to stop that and have a clean UDS; it may [facility]; I am willing to repeat at that time; will be starting lexapro for anxiety; I contact the pharmacy and CANCELED the Rx for the benzo

## 2018-09-22 ENCOUNTER — Encounter: Payer: Self-pay | Admitting: Family Medicine

## 2018-09-22 ENCOUNTER — Ambulatory Visit: Payer: Medicaid Other | Admitting: Family Medicine

## 2018-09-22 DIAGNOSIS — F411 Generalized anxiety disorder: Secondary | ICD-10-CM | POA: Diagnosis not present

## 2018-09-22 DIAGNOSIS — Z6830 Body mass index (BMI) 30.0-30.9, adult: Secondary | ICD-10-CM

## 2018-09-22 DIAGNOSIS — L210 Seborrhea capitis: Secondary | ICD-10-CM

## 2018-09-22 DIAGNOSIS — F410 Panic disorder [episodic paroxysmal anxiety] without agoraphobia: Secondary | ICD-10-CM

## 2018-09-22 DIAGNOSIS — E6609 Other obesity due to excess calories: Secondary | ICD-10-CM | POA: Diagnosis not present

## 2018-09-22 MED ORDER — ESCITALOPRAM OXALATE 10 MG PO TABS: 10.0000 mg | ORAL_TABLET | Freq: Every day | ORAL | 3 refills | Status: DC

## 2018-09-22 MED ORDER — KETOCONAZOLE 2 % EX SHAM: 1.0000 "application " | MEDICATED_SHAMPOO | CUTANEOUS | 2 refills | Status: DC

## 2018-09-22 MED ORDER — TRIAMCINOLONE ACETONIDE 0.1 % EX LOTN: 1.0000 "application " | TOPICAL_LOTION | Freq: Two times a day (BID) | CUTANEOUS | 1 refills | Status: DC

## 2018-09-22 NOTE — Assessment & Plan Note (Signed)
Doing better.   

## 2018-09-22 NOTE — Assessment & Plan Note (Signed)
Doing much better with the Lexapro

## 2018-09-22 NOTE — Assessment & Plan Note (Signed)
Offered nutrition referral for menu planning, healthy diet

## 2018-09-22 NOTE — Assessment & Plan Note (Signed)
Use the ketoconazole shampoo

## 2018-09-22 NOTE — Progress Notes (Signed)
BP 132/62   Pulse 99   Temp 98.3 F (36.8 C) (Oral)   Ht 5' 10"  (1.778 m)   Wt 211 lb 6.4 oz (95.9 kg)   LMP 09/19/2018   SpO2 98%   BMI 30.33 kg/m    Subjective:    Patient ID: Emily Payne, female    DOB: 11-01-1990, 28 y.o.   MRN: [id number]  HPI: Zarayah Lanting is a 28 y.o. female  Chief Complaint  Patient presents with  . Follow-up    HPI She has been sick since before Christmas; started with cough; little bit of sinus congestion; coughing up mucous; yellow at one point; just tired and achy Thinks that she is getting better now; almost all gone away, just some cough now; no fevers  Her mood is much better; "I am feeling a lot better" Just not as much anxiety She was really fixated about something happening to her or other people Feels more calm; when stressed, it is not as earth-shattering Doing really well No nausea or headache Happy, but no mania or hypomania  Still having spots of scale on the scalp; other lotion was not approved  Drinking more water; does eat processed food; microwave pizza; obese  Depression screen [facility] 2/9 09/22/2018 08/31/2018 09/11/2014 08/14/2014  Decreased Interest 0 1 0 0  Down, Depressed, Hopeless 0 1 0 0  PHQ - 2 Score 0 2 0 0  Altered sleeping 2 3 - -  Tired, decreased energy 2 3 - -  Change in appetite 0 1 - -  Feeling bad or failure about yourself  1 0 - -  Trouble concentrating 0 0 - -  Moving slowly or fidgety/restless 0 3 - -  Suicidal thoughts 0 0 - -  PHQ-9 Score 5 12 - -  Difficult doing work/chores - Not difficult at all - -  NO SI/HI  Fall Risk  08/31/2018 09/11/2014 08/14/2014  Falls in the past year? 0 No No    Relevant past medical, surgical, family and social history reviewed Past Medical History:  Diagnosis Date  . Anxiety   . Bipolar disorder ([place])    no med currently  . Depression   . Hypoglycemic syndrome   . Migraines    otc med prn. with aura   . Poor fetal growth affecting management of mother  in third trimester    Past Surgical History:  Procedure Laterality Date  . abscess tooth    . CESAREAN SECTION N/A 10/01/2014   Procedure: PRIMARY CESAREAN SECTION;  Surgeon: Woodroe Mode, MD;  Location: [facility] [facility];  Service: Obstetrics;  Laterality: N/A;  . TONSILLECTOMY    . WISDOM TOOTH EXTRACTION     Family History  Problem Relation Age of Onset  . Ovarian cysts Mother   . Stroke Maternal Grandmother   . Cancer Maternal Grandmother 27       uterine cancer  . Lung cancer Paternal Grandfather   . Diabetes Maternal Great-grandmother   . Lung cancer Maternal Great-grandmother    Social History   Tobacco Use  . Smoking status: Never Smoker  . Smokeless tobacco: Never Used  Substance Use Topics  . Alcohol use: Yes    Alcohol/week: 0.0 standard drinks    Comment: rare but none with pregnancy  . Drug use: No     [facility] Visit from 08/31/2018 in [facility]  AUDIT-C Score  2      Interim medical history since last visit reviewed. Allergies and medications  reviewed  Review of Systems Per HPI unless specifically indicated above     Objective:    BP 132/62   Pulse 99   Temp 98.3 F (36.8 C) (Oral)   Ht 5' 10"  (1.778 m)   Wt 211 lb 6.4 oz (95.9 kg)   LMP 09/19/2018   SpO2 98%   BMI 30.33 kg/m   Wt Readings from Last 3 Encounters:  09/22/18 211 lb 6.4 oz (95.9 kg)  08/31/18 214 lb 9.6 oz (97.3 kg)  06/08/18 213 lb (96.6 kg)    Physical Exam Constitutional:      Appearance: She is well-developed. She is obese. She is not ill-appearing.  Eyes:     General: No scleral icterus. Cardiovascular:     Rate and Rhythm: Normal rate and regular rhythm.  Pulmonary:     Effort: Pulmonary effort is normal.     Breath sounds: Normal breath sounds.  Neurological:     Mental Status: She is alert.  Psychiatric:        Mood and Affect: Mood normal.        Behavior: Behavior normal.     Results for orders placed or performed in visit on 08/31/18  Urine  Drug Screen w/Alc, no confirm  Result Value Ref Range   Please note     AMPHETAMINES (1000 ng/mL SCRN) NEGATIVE    BARBITURATES NEGATIVE    BENZODIAZEPINES NEGATIVE    COCAINE METABOLITES NEGATIVE    MARIJUANA MET (50 ng/mL SCRN) POSITIVE (A)    METHADONE NEGATIVE    METHAQUALONE NEGATIVE    OPIATES NEGATIVE    PHENCYCLIDINE NEGATIVE    PROPOXYPHENE NEGATIVE    ALCOHOL, ETHYL (U) NEGATIVE       Assessment & Plan:   Problem List Items Addressed This Visit      Musculoskeletal and Integument   Dandruff    Use the ketoconazole shampoo        Other   Obesity (Chronic)    Offered nutrition referral for menu planning, healthy diet      Relevant Orders   Amb ref to Medical Nutrition Therapy-MNT   Generalized anxiety disorder with panic attacks (Chronic)    Doing much better with the Lexapro      Relevant Medications   escitalopram (LEXAPRO) 10 MG tablet       Follow up plan: Return in about 3 months (around 12/22/2018).  An after-visit summary was printed and given to the patient at [place].  Please see the patient instructions which may contain other information and recommendations beyond what is mentioned above in the assessment and plan.  Meds ordered this encounter  Medications  . triamcinolone lotion (KENALOG) 0.1 %    Sig: Apply 1 application topically 2 (two) times daily. (scalp)    Dispense:  60 mL    Refill:  1  . ketoconazole (NIZORAL) 2 % shampoo    Sig: Apply 1 application topically 2 (two) times a week. Use this instead of the prescription lotions    Dispense:  120 mL    Refill:  2    CANCEL the triamcinolone lotion sent earlier  . escitalopram (LEXAPRO) 10 MG tablet    Sig: Take 1 tablet (10 mg total) by mouth daily.    Dispense:  30 tablet    Refill:  3    Orders Placed This Encounter  Procedures  . Amb ref to Medical Nutrition Therapy-MNT

## 2018-09-22 NOTE — Patient Instructions (Addendum)
I'll recommend Mucinex over-the-counter without the "D", just plain for congestion Try to use PLAIN cold or allergy medicine without the decongestant Avoid: phenylephrine, phenylpropanolamine, and pseudoephredine  Check out the information at familydoctor.org entitled "Nutrition for Weight Loss: What You Need to Know about Fad Diets" Try to lose between 1-2 pounds per week by taking in fewer calories and burning off more calories You can succeed by limiting portions, limiting foods dense in calories and fat, becoming more active, and drinking 8 glasses of water a day (64 ounces) Don't skip meals, especially breakfast, as skipping meals may alter your metabolism Do not use over-the-counter weight loss pills or gimmicks that claim rapid weight loss A healthy BMI (or body mass index) is between 18.5 and 24.9 You can calculate your ideal BMI at the NIH website JobEconomics.hu  We'll have you see the nutritionist   Obesity, Adult Obesity is having too much body fat. If you have a BMI of 30 or more, you are obese. BMI is a number that explains how much body fat you have. Obesity is often caused by taking in (consuming) more calories than your body uses. Obesity can cause serious health problems. Changing your lifestyle can help to treat obesity. Follow these instructions at home: Eating and drinking   Follow advice from your doctor about what to eat and drink. Your doctor may tell you to: ? Cut down on (limit) fast foods, sweets, and processed snack foods. ? Choose low-fat options. For example, choose low-fat milk instead of whole milk. ? Eat 5 or more servings of fruits or vegetables every day. ? Eat at home more often. This gives you more control over what you eat. ? Choose healthy foods when you eat out. ? Learn what a healthy portion size is. A portion size is the amount of a certain food that is healthy for you to eat at one time. This is  different for each person. ? Keep low-fat snacks available. ? Avoid sugary drinks. These include soda, fruit juice, iced tea that is sweetened with sugar, and flavored milk. ? Eat a healthy breakfast.  Drink enough water to keep your pee (urine) clear or pale yellow.  Do not go without eating for long periods of time (do not fast).  Do not go on popular or trendy diets (fad diets). Physical Activity  Exercise often, as told by your doctor. Ask your doctor: ? What types of exercise are safe for you. ? How often you should exercise.  Warm up and stretch before being active.  Do slow stretching after being active (cool down).  Rest between times of being active. Lifestyle  Limit how much time you spend in front of your TV, computer, or video game system (be less sedentary).  Find ways to reward yourself that do not involve food.  Limit alcohol intake to no more than 1 drink a day for nonpregnant women and 2 drinks a day for men. One drink equals 12 oz of beer, 5 oz of wine, or 1 oz of hard liquor. General instructions  Keep a weight loss journal. This can help you keep track of: ? The food that you eat. ? The exercise that you do.  Take over-the-counter and prescription medicines only as told by your doctor.  Take vitamins and supplements only as told by your doctor.  Think about joining a support group. Your doctor may be able to help with this.  Keep all follow-up visits as told by your doctor. This is  important. Contact a doctor if:  You cannot meet your weight loss goal after you have changed your diet and lifestyle for 6 weeks. This information is not intended to replace advice given to you by your health care provider. Make sure you discuss any questions you have with your health care provider. Document Released: 11/29/2011 Document Revised: 02/12/2016 Document Reviewed: 06/25/2015 Elsevier Interactive Patient Education  2019 Elsevier Inc.  Preventing Health Risks  of Being Overweight Maintaining a healthy body weight is an important part of your overall health. Your healthy body weight depends on your age, gender, and height. Being overweight puts you at risk for many health problems, including:  Heart disease.  Diabetes.  Problems sleeping.  Joint problems. You can make changes to your diet and lifestyle to prevent these risks. Consider working with a health care provider or a dietitian to make these changes. What nutrition changes can be made?   Eat only as much as your body needs. In most cases, this is about 2,000 calories a day, but the amount varies depending on your height, gender, and activity level. Ask your health care provider how many calories you should have each day. Eating more than your body needs on a regular basis can cause you to become overweight or obese.  Eat slowly, and stop eating when you feel full.  Choose healthy foods, including: ? Fruits and vegetables. ? Lean meats. ? Low-fat dairy products. ? High-fiber foods, such as whole grains and beans. ? Healthy snacks like vegetable sticks, a piece of fruit, or a small amount of yogurt or cheese.  Avoid foods and drinks that are high in sugar, salt (sodium), saturated fat, or trans fat. This includes: ? Many desserts such as candy, cookies, and ice cream. ? Soda. ? Fried foods. ? Processed meats such as hot dogs or lunch meats. ? Prepackaged snack foods. What lifestyle changes can be made?   Exercise for at least 150 minutes a week to prevent weight gain, or as often as recommended by your health care provider. Do moderate-intensity exercise, such as brisk walking. ? Spread it out by exercising for 30 minutes 5 days a week, or in short 10-minute bursts several times a day.  Find other ways to stay active and burn calories, such as yard work or a hobby that involves physical activity.  Get at least 8 hours of sleep each night. When you are well-rested, you are more  likely to be active and make healthy choices during the day. To sleep better: ? Try to go to bed and wake up at about the same time every day. ? Keep your bedroom dark, quiet, and cool. ? Make sure that your bed is comfortable. ? Avoid stimulating activities, such as watching television or exercising, for at least one hour before bedtime. Why are these changes important? Eating healthy and being active helps you lose weight and prevent health problems caused by being overweight. Making these changes can also help you manage stress, feel better mentally, and connect with friends and family. What can happen if changes are not made? Being overweight can affect you for your entire life. You may develop joint or bone problems that make it painful or difficult for you to play sports or do activities you enjoy. Being overweight puts stress on your heart and lungs and can lead to medical problems like diabetes, heart disease, and sleeping problems. Where to find support You can get support for preventing health risks of being overweight from:  Your health care provider or a dietitian. They can provide guidance about healthy eating and healthy lifestyle choices.  Weight loss support groups, online or in-person. Where to find more information  MyPlate: https://ball-collins.biz/www.choosemyplate.gov ? This an online tool that provides personalized recommendations about foods to eat each day.  The Centers for Disease Control and Prevention: AffordableScrapbook.glwww.cdc.gov/healthyweight ? This resource gives tips for managing weight and having an active lifestyle. Summary  To prevent unhealthy weight gain, it is important to maintain a healthy diet high in vegetables and whole grains, exercise regularly, and get at least 8 hours of sleep each night.  Making these changes helps prevent many long-term (chronic) health conditions that can shorten your life, such as diabetes, heart disease, and stroke. This information is not intended to replace  advice given to you by your health care provider. Make sure you discuss any questions you have with your health care provider. Document Released: 08/03/2017 Document Revised: 08/03/2017 Document Reviewed: 08/03/2017 Elsevier Interactive Patient Education  2019 [employer]

## 2018-09-27 ENCOUNTER — Ambulatory Visit: Payer: Medicaid Other | Admitting: Nurse Practitioner

## 2018-09-27 ENCOUNTER — Encounter: Payer: Self-pay | Admitting: Nurse Practitioner

## 2018-09-27 VITALS — BP 118/76 | HR 98 | Temp 98.2°F | Resp 18 | Ht 69.5 in | Wt 207.6 lb

## 2018-09-27 DIAGNOSIS — J029 Acute pharyngitis, unspecified: Secondary | ICD-10-CM | POA: Diagnosis not present

## 2018-09-27 DIAGNOSIS — R51 Headache: Secondary | ICD-10-CM

## 2018-09-27 DIAGNOSIS — R590 Localized enlarged lymph nodes: Secondary | ICD-10-CM | POA: Diagnosis not present

## 2018-09-27 DIAGNOSIS — R519 Headache, unspecified: Secondary | ICD-10-CM

## 2018-09-27 DIAGNOSIS — R059 Cough, unspecified: Secondary | ICD-10-CM

## 2018-09-27 DIAGNOSIS — R5383 Other fatigue: Secondary | ICD-10-CM

## 2018-09-27 DIAGNOSIS — H938X3 Other specified disorders of ear, bilateral: Secondary | ICD-10-CM | POA: Diagnosis not present

## 2018-09-27 DIAGNOSIS — R05 Cough: Secondary | ICD-10-CM

## 2018-09-27 LAB — CBC WITH DIFFERENTIAL/PLATELET
ABSOLUTE MONOCYTES: 879 {cells}/uL (ref 200–950)
Basophils Absolute: 16 cells/uL (ref 0–200)
Basophils Relative: 0.1 %
Eosinophils Absolute: 47 cells/uL (ref 15–500)
Eosinophils Relative: 0.3 %
HEMATOCRIT: 45 % (ref 35.0–45.0)
HEMOGLOBIN: 15.1 g/dL (ref 11.7–15.5)
LYMPHS ABS: 2261 {cells}/uL (ref 850–3900)
MCH: 29.6 pg (ref 27.0–33.0)
MCHC: 33.6 g/dL (ref 32.0–36.0)
MCV: 88.2 fL (ref 80.0–100.0)
MPV: 10 fL (ref 7.5–12.5)
Monocytes Relative: 5.6 %
Neutro Abs: 12497 cells/uL — ABNORMAL HIGH (ref 1500–7800)
Neutrophils Relative %: 79.6 %
Platelets: 290 10*3/uL (ref 140–400)
RBC: 5.1 10*6/uL (ref 3.80–5.10)
RDW: 11.7 % (ref 11.0–15.0)
Total Lymphocyte: 14.4 %
WBC: 15.7 10*3/uL — ABNORMAL HIGH (ref 3.8–10.8)

## 2018-09-27 LAB — POCT INFLUENZA A/B
Influenza A, POC: NEGATIVE
Influenza B, POC: NEGATIVE

## 2018-09-27 MED ORDER — LORATADINE 10 MG PO TABS: 10.0000 mg | ORAL_TABLET | Freq: Every day | ORAL | 0 refills | Status: DC

## 2018-09-27 MED ORDER — AMOXICILLIN-POT CLAVULANATE 875-125 MG PO TABS: 1.0000 | ORAL_TABLET | Freq: Two times a day (BID) | ORAL | 0 refills | Status: DC

## 2018-09-27 MED ORDER — MAGIC MOUTHWASH W/LIDOCAINE: 5.0000 mL | Freq: Three times a day (TID) | ORAL | 0 refills | Status: DC | PRN

## 2018-09-27 NOTE — Progress Notes (Signed)
Name: Emily Payne   MRN: [id number]    DOB: [birth year]   Date:09/27/2018       Progress Note  Subjective  Chief Complaint  Chief Complaint  Patient presents with  . Headache  . Sore Throat  . Ear Fullness  . Cough    HPI  Patient endorses headache and bilateral ear pain started on Sunday then progressed to throat and left sided neck pain, cough and fatigue. Low-grade fevers, chills, mild shortness of breath- not sure if it is related to her anxiety. Endorses pain when swallowing.  Has tried tylenol at home- helps with pain.  Denies chest pain, nausea, abdominal pain, rash. Patient Active Problem List   Diagnosis Date Noted  . 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 Generalized anxiety disorder with panic attacks 08/31/2018  . Depression 02/21/2015  . Dandruff 06/22/2013  . Obesity 07/04/2012    Past Medical History:  Diagnosis Date  . Anxiety   . Bipolar disorder (HCC)    no med currently  . Depression   . Hypoglycemic syndrome   . Migraines    otc med prn. with aura   . Poor fetal growth affecting management of mother in third trimester     Past Surgical History:  Procedure Laterality Date  . abscess tooth    . CESAREAN SECTION N/A 10/01/2014   Procedure: PRIMARY CESAREAN SECTION;  Surgeon: Adam Phenix, MD;  Location: [facility];  Service: Obstetrics;  Laterality: N/A;  . TONSILLECTOMY    . WISDOM TOOTH EXTRACTION      Social History   Tobacco Use  . Smoking status: Never Smoker  . Smokeless tobacco: Never Used  Substance Use Topics  . Alcohol use: Yes    Alcohol/week: 0.0 standard drinks    Comment: rare but none with pregnancy     Current Outpatient Medications:  .  escitalopram (LEXAPRO) 10 MG tablet, Take 1 tablet (10 mg total) by mouth daily., Disp: 30 tablet, Rfl: 3 .  ketoconazole (NIZORAL) 2 % shampoo, Apply 1 application topically 2 (two) times a week. Use this instead of the prescription lotions, Disp: 120 mL, Rfl: 2 .  LORazepam (ATIVAN) 1 MG tablet,  Take 0.5-1 tablets (0.5-1 mg total) by mouth every 8 (eight) hours as needed for anxiety. **NO controlled substances from [facility] at this time**, Disp: , Rfl:  .  norethindrone (MICRONOR,CAMILA,ERRIN) 0.35 MG tablet, Take 1 tablet (0.35 mg total) by mouth daily., Disp: 3 Package, Rfl: 3 .  prenatal vitamin w/FE, FA (NATACHEW) 29-1 MG CHEW chewable tablet, Chew 1 tablet by mouth daily at 12 noon., Disp: 30 tablet, Rfl: 5  Allergies  Allergen Reactions  . Sulfa Drugs Cross Reactors Anaphylaxis, Itching and Other (See Comments)    Flu like symptoms    ROS  No other specific complaints in a complete review of systems (except as listed in HPI above).  Objective  Vitals:   09/27/18 1329  BP: 118/76  Pulse: 98  Resp: 18  Temp: 98.2 F (36.8 C)  TempSrc: Oral  SpO2: 98%  Weight: 207 lb 9.6 oz (94.2 kg)  Height: 5' 9.5" (1.765 m)     Body mass index is 30.22 kg/m.  Nursing Note and Vital Signs reviewed.  Physical Exam HENT:     Head: Normocephalic and atraumatic.     Right Ear: Hearing, tympanic membrane, ear canal and external ear normal. No drainage, swelling or tenderness. Tympanic membrane is not erythematous.     Left Ear: Hearing, tympanic membrane, ear  canal and external ear normal. No drainage, swelling or tenderness. Tympanic membrane is not erythematous.     Nose: Nose normal.     Right Sinus: No maxillary sinus tenderness or frontal sinus tenderness.     Left Sinus: No maxillary sinus tenderness or frontal sinus tenderness.     Mouth/Throat:     Mouth: Mucous membranes are dry.     Pharynx: Uvula midline. Posterior oropharyngeal erythema present. No pharyngeal swelling or oropharyngeal exudate.     Tonsils: No tonsillar exudate.  Eyes:     General:        Right eye: No discharge.        Left eye: No discharge.     Conjunctiva/sclera: Conjunctivae normal.     Pupils: Pupils are equal, round, and reactive to light.  Neck:     Musculoskeletal: Normal range of  motion. No neck rigidity.  Cardiovascular:     Rate and Rhythm: Normal rate.  Pulmonary:     Effort: Pulmonary effort is normal.     Breath sounds: Normal breath sounds.  Abdominal:     Tenderness: There is no abdominal tenderness.  Lymphadenopathy:     Cervical: Cervical adenopathy (with left sided neck tenderness) present.  Skin:    General: Skin is warm and dry.     Findings: No rash.  Neurological:     Mental Status: She is alert.  Psychiatric:        Mood and Affect: Mood normal.        Behavior: Behavior normal.        Judgment: Judgment normal.      Results for orders placed or performed in visit on 09/27/18 (from the past 48 hour(s))  POCT Influenza A/B     Status: Normal   Collection Time: 09/27/18  1:38 PM  Result Value Ref Range   Influenza A, POC Negative Negative   Influenza B, POC Negative Negative    Assessment & Plan  1. Sensation of fullness in both ears - Rest, drink plenty of water (minimum 64 ounces) - Alternate between tylenol [phone] mg every 8 hours( no more than 3,000mg  in 24 hours) ; ibuprofen 400-600mg  every 8 hours ( no more than 2,400mg  in 24 hours)  - for sore throat: honey- lemon teas, saline gaggles, and magic mouthwash  - For cough if it worsens take musinex-DM  - POCT Influenza A/B - loratadine (CLARITIN) 10 MG tablet; Take 1 tablet (10 mg total) by mouth daily.  Dispense: 30 tablet; Refill: 0  2. Cervical lymphadenopathy - POCT Influenza A/B - CBC with Differential - amoxicillin-clavulanate (AUGMENTIN) 875-125 MG tablet; Take 1 tablet by mouth 2 (two) times daily.  Dispense: 20 tablet; Refill: 0  3. Sore throat - POCT Influenza A/B - magic mouthwash w/lidocaine SOLN; Take 5 mLs by mouth 3 (three) times daily as needed for mouth pain.  Dispense: 10 mL; Refill: 0  4. Acute nonintractable headache, unspecified headache type - POCT Influenza A/B  5. Cough - POCT Influenza A/B  6. Other fatigue - POCT Influenza  A/B      -Red flags and when to present for emergency care or RTC including fever >101.20F, chest pain, shortness of breath, new/worsening/un-resolving symptoms,  reviewed with patient at time of visit. Follow up and care instructions discussed and provided in AVS.

## 2018-09-27 NOTE — Patient Instructions (Addendum)
-   Rest, drink plenty of water (minimum 64 ounces) - Alternate between tylenol [phone] mg every 8 hours( no more than 3,000mg  in 24 hours) ; ibuprofen 400-600mg  every 8 hours ( no more than 2,400mg  in 24 hours)  - for sore throat: honey- lemon teas, saline gaggles, and magic mouthwash  - For cough if it worsens take musinex-DM

## 2018-10-15 ENCOUNTER — Other Ambulatory Visit: Payer: Self-pay | Admitting: Family Medicine

## 2018-10-19 ENCOUNTER — Other Ambulatory Visit: Payer: Self-pay | Admitting: Family Medicine

## 2018-10-19 NOTE — Telephone Encounter (Signed)
Copied from CRM [id number]. Topic: Quick Communication - Rx Refill/Question >> Oct 19, 2018  1:51 PM Jens Som A wrote: Medication: escitalopram (LEXAPRO) 10 MG tablet [[id number]] On Patient MyChart 08/31/18 Last fill date with 0 refills. In Patients chart, last script was sent to the pharmacy 09/22/18-Said that Dr. Gibson Ramp the medication. Patient has 2 refills on the medication. Pharmacy will not refill  Has the patient contacted their pharmacy? Yes  (Agent: If no, request that the patient contact the pharmacy for the refill.) (Agent: If yes, when and what did the pharmacy advise?)  Preferred Pharmacy (with phone number or street name): [id number] [place], [place] - [address] AT [facility] OF [address] [place] [address] Phone: [phone] Fax: [phone]    Agent: Please be advised that RX refills may take up to 3 business days. We ask that you follow-up with your pharmacy.

## 2018-10-19 NOTE — Telephone Encounter (Signed)
Called in and refilled with pharmacy.

## 2018-12-22 ENCOUNTER — Ambulatory Visit: Payer: Medicaid Other | Admitting: Family Medicine

## 2019-05-21 LAB — FETAL NONSTRESS TEST

## 2019-05-31 LAB — FETAL NONSTRESS TEST

## 2019-06-12 ENCOUNTER — Other Ambulatory Visit: Payer: Self-pay

## 2019-06-13 NOTE — Telephone Encounter (Signed)
lvm for scheduling °

## 2019-06-27 ENCOUNTER — Emergency Department: Payer: Medicaid Other

## 2019-06-27 ENCOUNTER — Emergency Department: Payer: Medicaid Other | Admitting: Anesthesiology

## 2019-06-27 ENCOUNTER — Encounter: Payer: Self-pay | Admitting: Emergency Medicine

## 2019-06-27 ENCOUNTER — Encounter
Admission: EM | Disposition: A | Discharge status: Home / Self Care | Payer: Self-pay | Source: Home / Self Care | Attending: Obstetrics and Gynecology

## 2019-06-27 ENCOUNTER — Other Ambulatory Visit: Payer: Self-pay

## 2019-06-27 ENCOUNTER — Inpatient Hospital Stay
Admission: EM | Admit: 2019-06-27 | Discharge: 2019-06-28 | DRG: 817 | Disposition: A | Discharge status: Home / Self Care | Payer: Medicaid Other | Attending: Obstetrics and Gynecology | Admitting: Obstetrics and Gynecology

## 2019-06-27 DIAGNOSIS — O3481 Maternal care for other abnormalities of pelvic organs, first trimester: Secondary | ICD-10-CM | POA: Diagnosis not present

## 2019-06-27 DIAGNOSIS — O26899 Other specified pregnancy related conditions, unspecified trimester: Secondary | ICD-10-CM

## 2019-06-27 DIAGNOSIS — Z3A00 Weeks of gestation of pregnancy not specified: Secondary | ICD-10-CM | POA: Diagnosis not present

## 2019-06-27 DIAGNOSIS — Z9889 Other specified postprocedural states: Secondary | ICD-10-CM

## 2019-06-27 DIAGNOSIS — R102 Pelvic and perineal pain: Secondary | ICD-10-CM | POA: Diagnosis not present

## 2019-06-27 DIAGNOSIS — K661 Hemoperitoneum: Secondary | ICD-10-CM | POA: Diagnosis present

## 2019-06-27 DIAGNOSIS — O26891 Other specified pregnancy related conditions, first trimester: Secondary | ICD-10-CM | POA: Diagnosis not present

## 2019-06-27 DIAGNOSIS — N83202 Unspecified ovarian cyst, left side: Secondary | ICD-10-CM | POA: Diagnosis not present

## 2019-06-27 DIAGNOSIS — O00101 Right tubal pregnancy without intrauterine pregnancy: Principal | ICD-10-CM | POA: Diagnosis present

## 2019-06-27 DIAGNOSIS — O009 Unspecified ectopic pregnancy without intrauterine pregnancy: Secondary | ICD-10-CM | POA: Diagnosis not present

## 2019-06-27 DIAGNOSIS — Z03818 Encounter for observation for suspected exposure to other biological agents ruled out: Secondary | ICD-10-CM | POA: Diagnosis not present

## 2019-06-27 DIAGNOSIS — Z20828 Contact with and (suspected) exposure to other viral communicable diseases: Secondary | ICD-10-CM | POA: Diagnosis present

## 2019-06-27 HISTORY — PX: DIAGNOSTIC LAPAROSCOPY WITH REMOVAL OF ECTOPIC PREGNANCY: SHX6449

## 2019-06-27 HISTORY — PX: LAPAROSCOPIC UNILATERAL SALPINGECTOMY: SHX5934

## 2019-06-27 LAB — CBC
HCT: 37.4 % (ref 36.0–46.0)
Hemoglobin: 12.7 g/dL (ref 12.0–15.0)
MCH: 30 pg (ref 26.0–34.0)
MCHC: 34 g/dL (ref 30.0–36.0)
MCV: 88.4 fL (ref 80.0–100.0)
Platelets: 285 10*3/uL (ref 150–400)
RBC: 4.23 MIL/uL (ref 3.87–5.11)
RDW: 11.9 % (ref 11.5–15.5)
WBC: 11.3 10*3/uL — ABNORMAL HIGH (ref 4.0–10.5)
nRBC: 0 % (ref 0.0–0.2)

## 2019-06-27 LAB — URINALYSIS, COMPLETE (UACMP) WITH MICROSCOPIC
Bacteria, UA: NONE SEEN
Bilirubin Urine: NEGATIVE
Glucose, UA: NEGATIVE mg/dL
Ketones, ur: NEGATIVE mg/dL
Leukocytes,Ua: NEGATIVE
Nitrite: NEGATIVE
Protein, ur: NEGATIVE mg/dL
Specific Gravity, Urine: 1.019 (ref 1.005–1.030)
pH: 6 (ref 5.0–8.0)

## 2019-06-27 LAB — COMPREHENSIVE METABOLIC PANEL
ALT: 16 U/L (ref 0–44)
AST: 14 U/L — ABNORMAL LOW (ref 15–41)
Albumin: 4.1 g/dL (ref 3.5–5.0)
Alkaline Phosphatase: 54 U/L (ref 38–126)
Anion gap: 10 (ref 5–15)
BUN: 8 mg/dL (ref 6–20)
CO2: 23 mmol/L (ref 22–32)
Calcium: 9.1 mg/dL (ref 8.9–10.3)
Chloride: 105 mmol/L (ref 98–111)
Creatinine, Ser: 0.84 mg/dL (ref 0.44–1.00)
GFR calc Af Amer: >60 mL/min (ref >60)
GFR calc non Af Amer: >60 mL/min (ref >60)
Glucose, Bld: 110 mg/dL — ABNORMAL HIGH (ref 70–99)
Potassium: 3.5 mmol/L (ref 3.5–5.1)
Sodium: 138 mmol/L (ref 135–145)
Total Bilirubin: 0.5 mg/dL (ref 0.3–1.2)
Total Protein: 7.4 g/dL (ref 6.5–8.1)

## 2019-06-27 LAB — POCT PREGNANCY, URINE: Preg Test, Ur: POSITIVE — AB

## 2019-06-27 LAB — SARS CORONAVIRUS 2 BY RT PCR (HOSPITAL ORDER, PERFORMED IN ~~LOC~~ HOSPITAL LAB): SARS Coronavirus 2: NEGATIVE

## 2019-06-27 LAB — TYPE AND SCREEN
ABO/RH(D): A NEG
Antibody Screen: NEGATIVE

## 2019-06-27 LAB — HCG, QUANTITATIVE, PREGNANCY: hCG, Beta Chain, Quant, S: 6507 m[IU]/mL — ABNORMAL HIGH (ref <5)

## 2019-06-27 LAB — LIPASE, BLOOD: Lipase: 31 U/L (ref 11–51)

## 2019-06-27 SURGERY — LAPAROSCOPY, WITH ECTOPIC PREGNANCY SURGICAL TREATMENT
Anesthesia: General | Site: Abdomen | Laterality: Right

## 2019-06-27 MED ORDER — DEXAMETHASONE SODIUM PHOSPHATE 10 MG/ML IJ SOLN
INTRAMUSCULAR | Status: DC | PRN
  Administered 2019-06-27: 10 mg via INTRAVENOUS

## 2019-06-27 MED ORDER — ACETAMINOPHEN 500 MG PO TABS
1000.0000 mg | ORAL_TABLET | Freq: Once | ORAL | Status: AC
  Administered 2019-06-27: 20:00:00 1000 mg via ORAL
  Filled 2019-06-27: qty 2

## 2019-06-27 MED ORDER — FENTANYL CITRATE (PF) 100 MCG/2ML IJ SOLN
INTRAMUSCULAR | Status: AC
  Filled 2019-06-27: qty 2

## 2019-06-27 MED ORDER — ROCURONIUM BROMIDE 100 MG/10ML IV SOLN
INTRAVENOUS | Status: DC | PRN
  Administered 2019-06-27: 5 mg via INTRAVENOUS
  Administered 2019-06-27: 45 mg via INTRAVENOUS
  Administered 2019-06-28: 20 mg via INTRAVENOUS

## 2019-06-27 MED ORDER — FENTANYL CITRATE (PF) 100 MCG/2ML IJ SOLN
INTRAMUSCULAR | Status: DC | PRN
  Administered 2019-06-27: 100 ug via INTRAVENOUS
  Administered 2019-06-27 – 2019-06-28 (×2): 50 ug via INTRAVENOUS

## 2019-06-27 MED ORDER — SUGAMMADEX SODIUM 200 MG/2ML IV SOLN
INTRAVENOUS | Status: AC
  Filled 2019-06-27: qty 2

## 2019-06-27 MED ORDER — SUCCINYLCHOLINE CHLORIDE 20 MG/ML IJ SOLN
INTRAMUSCULAR | Status: DC | PRN
  Administered 2019-06-27: 120 mg via INTRAVENOUS

## 2019-06-27 MED ORDER — ONDANSETRON HCL 4 MG/2ML IJ SOLN
INTRAMUSCULAR | Status: AC
  Filled 2019-06-27: qty 2

## 2019-06-27 MED ORDER — PROPOFOL 10 MG/ML IV BOLUS
INTRAVENOUS | Status: DC | PRN
  Administered 2019-06-27: 150 mg via INTRAVENOUS

## 2019-06-27 MED ORDER — RHO D IMMUNE GLOBULIN 1500 UNIT/2ML IJ SOSY
300.0000 ug | PREFILLED_SYRINGE | Freq: Once | INTRAMUSCULAR | Status: DC
  Filled 2019-06-27: qty 2

## 2019-06-27 MED ORDER — MIDAZOLAM HCL 2 MG/2ML IJ SOLN
INTRAMUSCULAR | Status: AC
  Filled 2019-06-27: qty 2

## 2019-06-27 MED ORDER — DEXMEDETOMIDINE HCL 200 MCG/2ML IV SOLN
INTRAVENOUS | Status: DC | PRN
  Administered 2019-06-27 – 2019-06-28 (×2): 20 ug via INTRAVENOUS

## 2019-06-27 MED ORDER — BUPIVACAINE HCL (PF) 0.5 % IJ SOLN
INTRAMUSCULAR | Status: AC
  Filled 2019-06-27: qty 30

## 2019-06-27 MED ORDER — LIDOCAINE HCL (CARDIAC) PF 100 MG/5ML IV SOSY
PREFILLED_SYRINGE | INTRAVENOUS | Status: DC | PRN
  Administered 2019-06-27: 60 mg via INTRAVENOUS

## 2019-06-27 MED ORDER — DEXAMETHASONE SODIUM PHOSPHATE 10 MG/ML IJ SOLN
INTRAMUSCULAR | Status: AC
  Filled 2019-06-27: qty 1

## 2019-06-27 MED ORDER — ROCURONIUM BROMIDE 50 MG/5ML IV SOLN
INTRAVENOUS | Status: AC
  Filled 2019-06-27: qty 1

## 2019-06-27 MED ORDER — LIDOCAINE HCL (PF) 2 % IJ SOLN
INTRAMUSCULAR | Status: AC
  Filled 2019-06-27: qty 10

## 2019-06-27 SURGICAL SUPPLY — 46 items
ADH SKN CLS APL DERMABOND .7 (GAUZE/BANDAGES/DRESSINGS) ×2
ANCHOR TIS RET SYS 235ML (MISCELLANEOUS) ×4 IMPLANT
APL PRP STRL LF DISP 70% ISPRP (MISCELLANEOUS) ×2
APL SRG 38 LTWT LNG FL B (MISCELLANEOUS)
APPLICATOR ARISTA FLEXITIP XL (MISCELLANEOUS) IMPLANT
BAG TISS RTRVL C235 10X14 (MISCELLANEOUS) ×2
BAG URINE DRAINAGE (UROLOGICAL SUPPLIES) ×3 IMPLANT
BLADE SURG SZ11 CARB STEEL (BLADE) ×4 IMPLANT
CHLORAPREP W/TINT 26 (MISCELLANEOUS) ×3 IMPLANT
DEFOGGER SCOPE WARMER CLEARIFY (MISCELLANEOUS) ×4 IMPLANT
DERMABOND ADVANCED (GAUZE/BANDAGES/DRESSINGS) ×2
DERMABOND ADVANCED .7 DNX12 (GAUZE/BANDAGES/DRESSINGS) ×2 IMPLANT
DRAPE LEGGINS SURG 28X43 STRL (DRAPES) ×2 IMPLANT
DRAPE UNDER BUTTOCK W/FLU (DRAPES) ×4 IMPLANT
DRSG TELFA 3X8 NADH (GAUZE/BANDAGES/DRESSINGS) ×4 IMPLANT
ELECT REM PT RETURN 9FT ADLT (ELECTROSURGICAL) ×4
ELECTRODE REM PT RTRN 9FT ADLT (ELECTROSURGICAL) IMPLANT
GLOVE BIOGEL PI IND STRL 6.5 (GLOVE) ×2 IMPLANT
GLOVE BIOGEL PI INDICATOR 6.5 (GLOVE) ×4
GOWN STRL REUS W/ TWL LRG LVL3 (GOWN DISPOSABLE) ×4 IMPLANT
GOWN STRL REUS W/TWL LRG LVL3 (GOWN DISPOSABLE) ×8
HEMOSTAT ARISTA ABSORB 3G PWDR (HEMOSTASIS) IMPLANT
IRRIGATION STRYKERFLOW (MISCELLANEOUS) IMPLANT
IRRIGATOR STRYKERFLOW (MISCELLANEOUS) ×4
KIT PINK PAD W/HEAD ARE REST (MISCELLANEOUS) ×4
KIT PINK PAD W/HEAD ARM REST (MISCELLANEOUS) ×2 IMPLANT
KIT TURNOVER CYSTO (KITS) ×3 IMPLANT
LIGASURE VESSEL 5MM BLUNT TIP (ELECTROSURGICAL) ×3 IMPLANT
NEEDLE HYPO 22GX1.5 SAFETY (NEEDLE) ×3 IMPLANT
NS IRRIG 500ML POUR BTL (IV SOLUTION) ×2 IMPLANT
PACK GYN LAPAROSCOPIC (MISCELLANEOUS) ×3 IMPLANT
PAD DRESSING TELFA 3X8 NADH (GAUZE/BANDAGES/DRESSINGS) IMPLANT
PAD OB MATERNITY 4.3X12.25 (PERSONAL CARE ITEMS) ×3 IMPLANT
PAD PREP 24X41 OB/GYN DISP (PERSONAL CARE ITEMS) ×4 IMPLANT
SET TUBE SMOKE EVAC HIGH FLOW (TUBING) ×3 IMPLANT
SLEEVE ENDOPATH XCEL 5M (ENDOMECHANICALS) ×4 IMPLANT
SOL PREP PVP 2OZ (MISCELLANEOUS) ×4
SOLUTION PREP PVP 2OZ (MISCELLANEOUS) ×2 IMPLANT
SURGILUBE 2OZ TUBE FLIPTOP (MISCELLANEOUS) ×3 IMPLANT
SUT MNCRL 4-0 (SUTURE) ×4
SUT MNCRL 4-0 27XMFL (SUTURE) ×2
SUT VIC AB 0 CT1 36 (SUTURE) ×2 IMPLANT
SUT VIC AB 2-0 UR6 27 (SUTURE) ×3 IMPLANT
SUTURE MNCRL 4-0 27XMF (SUTURE) ×1 IMPLANT
TROCAR ENDO BLADELESS 11MM (ENDOMECHANICALS) ×3 IMPLANT
TROCAR XCEL NON-BLD 5MMX100MML (ENDOMECHANICALS) ×4 IMPLANT

## 2019-06-27 NOTE — ED Triage Notes (Addendum)
Pt presents to ED with abd pain since yesterday morning. Pt reports pain started in her pelvis and is now more generalized. Pt also reports intermittent sharp pain in her vagina which has lessened today. Denies n/v/d. Reports having several normal bowel movents since onset of symptoms.  Pt states that she did take a pregnancy test today and it was positive and just wanted to make sure everything was ok.

## 2019-06-27 NOTE — Anesthesia Preprocedure Evaluation (Signed)
Anesthesia Evaluation  Patient identified by MRN, date of birth, ID band Patient awake    Reviewed: Allergy & Precautions, H&P , NPO status , Patient's Chart, lab work & pertinent test results  History of Anesthesia Complications Negative for: history of anesthetic complications  Airway Mallampati: II  TM Distance: >3 FB Neck ROM: full    Dental  (+) Chipped   Pulmonary neg pulmonary ROS, neg shortness of breath,           Cardiovascular Exercise Tolerance: Good (-) angina(-) Past MI and (-) DOE negative cardio ROS       Neuro/Psych  Headaches, PSYCHIATRIC DISORDERS negative psych ROS   GI/Hepatic negative GI ROS, Neg liver ROS, neg GERD  ,  Endo/Other  negative endocrine ROS  Renal/GU      Musculoskeletal   Abdominal   Peds  Hematology negative hematology ROS (+)   Anesthesia Other Findings Past Medical History: No date: Anxiety No date: Bipolar disorder ([place])     Comment:  no med currently No date: Depression No date: Hypoglycemic syndrome No date: Migraines     Comment:  otc med prn. with aura  No date: Poor fetal growth affecting management of mother in third  trimester  Past Surgical History: No date: abscess tooth 10/01/2014: CESAREAN SECTION; N/A     Comment:  Procedure: PRIMARY CESAREAN SECTION;  Surgeon: Woodroe Mode, MD;  Location: [facility];  Service: Obstetrics;                Laterality: N/A; No date: TONSILLECTOMY No date: WISDOM TOOTH EXTRACTION  BMI    Body Mass Index: 31.75 kg/m      Reproductive/Obstetrics negative OB ROS                             Anesthesia Physical Anesthesia Plan  ASA: V and emergent  Anesthesia Plan: General ETT, Rapid Sequence and Cricoid Pressure   Post-op Pain Management:    Induction: Intravenous  PONV Risk Score and Plan: Ondansetron, Dexamethasone, Midazolam and Treatment may vary due to age or medical  condition  Airway Management Planned: Oral ETT  Additional Equipment:   Intra-op Plan:   Post-operative Plan: Extubation in OR  Informed Consent: I have reviewed the patients History and Physical, chart, labs and discussed the procedure including the risks, benefits and alternatives for the proposed anesthesia with the patient or authorized representative who has indicated his/her understanding and acceptance.     Dental Advisory Given  Plan Discussed with: Anesthesiologist, CRNA and Surgeon  Anesthesia Plan Comments: (Patient consented for risks of anesthesia including but not limited to:  - adverse reactions to medications - damage to teeth, lips or other oral mucosa - sore throat or hoarseness - Damage to heart, brain, lungs or loss of life  Patient voiced understanding.)        Anesthesia Quick Evaluation

## 2019-06-27 NOTE — H&P (Signed)
History and Physical   Emily Payne is an 28 y.o. female.  HPI: Presented to the ER today with complaints of sharp and crampy abdominal pain. No vaginal bleeding. She found out she was pregnant yesterday. She reports the pain started yesterday evening. She denies dizziness, denies headaches, no fainting. She is able to carry on a conversation and answer questions.    LMP: May 13, 2019   Past Medical History:  Diagnosis Date  . Anxiety   . Bipolar disorder (HCC)    no med currently  . Depression   . Hypoglycemic syndrome   . Migraines    otc med prn. with aura   . Poor fetal growth affecting management of mother in third trimester     Past Surgical History:  Procedure Laterality Date  . abscess tooth    . CESAREAN SECTION N/A 10/01/2014   Procedure: PRIMARY CESAREAN SECTION;  Surgeon: Adam Phenix, MD;  Location: [facility];  Service: Obstetrics;  Laterality: N/A;  . TONSILLECTOMY    . WISDOM TOOTH EXTRACTION      Family History  Problem Relation Age of Onset  . Ovarian cysts Mother   . Stroke Maternal Grandmother   . Cancer Maternal Grandmother 27       uterine cancer  . Lung cancer Paternal Grandfather   . Diabetes Maternal Great-grandmother   . Lung cancer Maternal Great-grandmother     Social History:  reports that she has never smoked. She has never used smokeless tobacco. She reports current alcohol use. She reports that she does not use drugs.  Allergies:  Allergies  Allergen Reactions  . Sulfa Drugs Cross Reactors Anaphylaxis, Itching and Other (See Comments)    Flu like symptoms    Medications: I have reviewed the patient's current medications.  Results for orders placed or performed during the hospital encounter of 06/27/19 (from the past 48 hour(s))  Lipase, blood     Status: None   Collection Time: 06/27/19  7:42 PM  Result Value Ref Range   Lipase 31 11 - 51 U/L    Comment: Performed at [facility], [address] [place], [place]  [address]  Comprehensive metabolic panel     Status: Abnormal   Collection Time: 06/27/19  7:42 PM  Result Value Ref Range   Sodium 138 135 - 145 mmol/L   Potassium 3.5 3.5 - 5.1 mmol/L   Chloride 105 98 - 111 mmol/L   CO2 23 22 - 32 mmol/L   Glucose, Bld 110 (H) 70 - 99 mg/dL   BUN 8 6 - 20 mg/dL   Creatinine, Ser 4.27 0.44 - 1.00 mg/dL   Calcium 9.1 8.9 - 06.2 mg/dL   Total Protein 7.4 6.5 - 8.1 g/dL   Albumin 4.1 3.5 - 5.0 g/dL   AST 14 (L) 15 - 41 U/L   ALT 16 0 - 44 U/L   Alkaline Phosphatase 54 38 - 126 U/L   Total Bilirubin 0.5 0.3 - 1.2 mg/dL   GFR calc non Af Amer >60 >60 mL/min   GFR calc Af Amer >60 >60 mL/min   Anion gap 10 5 - 15    Comment: Performed at [facility], [address] [place], [place] [address]  CBC     Status: Abnormal   Collection Time: 06/27/19  7:42 PM  Result Value Ref Range   WBC 11.3 (H) 4.0 - 10.5 K/uL   RBC 4.23 3.87 - 5.11 MIL/uL   Hemoglobin 12.7 12.0 -  15.0 g/dL   HCT 37.4 36.0 - 46.0 %   MCV 88.4 80.0 - 100.0 fL   MCH 30.0 26.0 - 34.0 pg   MCHC 34.0 30.0 - 36.0 g/dL   RDW 11.9 11.5 - 15.5 %   Platelets 285 150 - 400 K/uL   nRBC 0.0 0.0 - 0.2 %    Comment: Performed at [facility], [address] [place], [place] [address]  Urinalysis, Complete w Microscopic     Status: Abnormal   Collection Time: 06/27/19  7:42 PM  Result Value Ref Range   Color, Urine YELLOW (A) YELLOW   APPearance CLEAR (A) CLEAR   Specific Gravity, Urine 1.019 1.005 - 1.030   pH 6.0 5.0 - 8.0   Glucose, UA NEGATIVE NEGATIVE mg/dL   Hgb urine dipstick SMALL (A) NEGATIVE   Bilirubin Urine NEGATIVE NEGATIVE   Ketones, ur NEGATIVE NEGATIVE mg/dL   Protein, ur NEGATIVE NEGATIVE mg/dL   Nitrite NEGATIVE NEGATIVE   Leukocytes,Ua NEGATIVE NEGATIVE   RBC / HPF 0-5 0 - 5 RBC/hpf   WBC, UA 0-5 0 - 5 WBC/hpf   Bacteria, UA NONE SEEN NONE SEEN   Squamous Epithelial / LPF 0-5 0 - 5   Mucus PRESENT     Comment: Performed at [facility],  [address] [place], [place] [address]  Pregnancy, urine POC     Status: Abnormal   Collection Time: 06/27/19  7:52 PM  Result Value Ref Range   Preg Test, Ur POSITIVE (A) NEGATIVE    Comment:        THE SENSITIVITY OF THIS METHODOLOGY IS >24 mIU/mL   hCG, quantitative, pregnancy     Status: Abnormal   Collection Time: 06/27/19  7:57 PM  Result Value Ref Range   hCG, Beta Chain, Quant, S 6,507 (H) <5 mIU/mL    Comment:          GEST. AGE      CONC.  (mIU/mL)   <=1 WEEK        5 - 50     2 WEEKS       50 - 500     3 WEEKS       100 - 10,000     4 WEEKS     1,000 - 30,000     5 WEEKS     3,500 - 115,000   6-8 WEEKS     12,000 - 270,000    12 WEEKS     15,000 - 220,000        FEMALE AND NON-PREGNANT FEMALE:     LESS THAN 5 mIU/mL Performed at [facility], [place], [place] [address]     US Ob Less Than 14 Weeks W/ Ob Transvaginal And Doppler  Result Date: 06/27/2019 CLINICAL DATA:  Pelvic pain for 1 day, quantitative HCG greater than 6,000 EXAM: OBSTETRIC <14 WK [place] AND TRANSVAGINAL OB US DOPPLER ULTRASOUND OF OVARIES TECHNIQUE: Both transabdominal and transvaginal ultrasound examinations were performed for complete evaluation of the gestation as well as the maternal uterus, adnexal regions, and pelvic cul-de-sac. Transvaginal technique was performed to assess early pregnancy. Color and duplex Doppler ultrasound was utilized to evaluate blood flow to the ovaries. COMPARISON:  None. FINDINGS: Intrauterine gestational sac: None Yolk sac:  Not Visualized. Embryo:  Not Visualized. Maternal uterus/adnexae: Right ovary measures 2.6 x 2.3 x 1.7 cm with a volume of 5.1 mL. Left ovary measures 4.5 x 4.9 by 5.6 cm and  is largely replaced by a cyst of similar dimensions. Arterial and venous flow are documented at the periphery of the cyst. Within the right adnexa, separate from the right ovary is a cystic lesion with thick echogenic rim measuring 1.2 x 1.6 x 1.4 cm,  possibly containing a yolk sac. There is moderate to large volume of complex fluid in the pelvis with echogenic material suspected to represent blood clot. Pulsed Doppler evaluation of both ovaries demonstrates normal appearing low-resistance arterial and venous waveforms on the right and at the periphery of the left ovarian cyst. IMPRESSION: 1. No IUP identified. 1.6 cm cystic lesion with thick echogenic rim in the right adnexa concerning for ectopic pregnancy. Moderate to large volume of complex fluid concerning for hemoperitoneum/ruptured ectopic pregnancy. 2. Negative for right ovarian torsion. 3. Approximate 5.6 cm left ovarian cyst. Left ovary is largely replaced by the cyst, flow is present at the periphery of the cyst. This has benign characteristics. No imaging follow up is required for premenopausal females. This follows consensus guidelines: Simple Adnexal Cysts: SRU Consensus Conference Update on Follow-up and Reporting. Radiology 2019; [id number]. 4. Critical Value/emergent results were called by telephone at the time of interpretation on 06/27/2019 at 10:14 pm to providerPATRICK ROBINSON , who verbally acknowledged these results. Electronically Signed   By: Jasmine PangKim  Fujinaga M.D.   On: 06/27/2019 22:14    Review of Systems  Constitutional: Negative for chills, fever, malaise/fatigue and weight loss.  HENT: Negative for congestion, hearing loss and sinus pain.   Eyes: Negative for blurred vision and double vision.  Respiratory: Negative for cough, sputum production, shortness of breath and wheezing.   Cardiovascular: Negative for chest pain, palpitations, orthopnea and leg swelling.  Gastrointestinal: Positive for abdominal pain. Negative for constipation, diarrhea, nausea and vomiting.  Genitourinary: Negative for dysuria, flank pain, frequency, hematuria and urgency.  Musculoskeletal: Negative for back pain, falls and joint pain.  Skin: Negative for itching and rash.  Neurological: Negative  for dizziness and headaches.  Psychiatric/Behavioral: Negative for depression, substance abuse and suicidal ideas. The patient is not nervous/anxious.    Blood pressure (!) 148/90, pulse (!) 102, temperature 98.8 F (37.1 C), temperature source Oral, resp. rate 16, height 5\' 9"  (1.753 m), weight 97.5 kg, last menstrual period 05/13/2019, SpO2 98 %. Physical Exam  Nursing note and vitals reviewed. Constitutional: She is oriented to person, place, and time. She appears well-developed and well-nourished.  HENT:  Head: Normocephalic and atraumatic.  Cardiovascular: Normal rate and regular rhythm.  Respiratory: Effort normal and breath sounds normal.  GI: Bowel sounds are normal. She exhibits distension. There is abdominal tenderness. There is rebound and guarding.  Musculoskeletal: Normal range of motion.  Neurological: She is alert and oriented to person, place, and time.  Skin: Skin is warm and dry.  Psychiatric: She has a normal mood and affect. Her behavior is normal. Judgment and thought content normal.    Assessment/Plan: 28 yo . G2P1001 at 6 weeks and 3 days by LMP 1. Suspected ruptured ectopic pregnancy with moderate hemoperitoneum. Will proceed to the OR emergently for diagnostic laparoscopy, evacuation of hemoperitoneum, possible salpingectomy and any indicated procedures. Discussed risk with patient and her mother in detail. All questions answered.  2. RH positive- will give rhogam.   Christanna R Schuman 06/27/2019, 10:56 PM

## 2019-06-27 NOTE — ED Notes (Signed)
Emily Payne in lab confirms he will add-on hCG preg.

## 2019-06-27 NOTE — ED Notes (Signed)
Dr. Nechama Guard, OBGYN, at bedside at this time.

## 2019-06-27 NOTE — ED Provider Notes (Signed)
[facility] Emergency Department Provider Note    First MD Initiated Contact with Patient 06/27/19 1956     (approximate)  I have reviewed the triage vital signs and the nursing notes.   HISTORY  Chief Complaint Abdominal Pain and Vaginal Pain    HPI Emily Payne is a 28 y.o. female with the below listed past medical history presents to the ER for evaluation of pelvic discomfort.  She is G2, P1 last menstrual cycle being at the end of August.  She is on birth control.  Denies any vaginal bleeding or discharge.  Denies any history of ovarian cysts.  Took a home pregnancy test noted that was a positive and due to her pain came to the ER for further evaluation.  No measured fevers.  No nausea or vomiting.    Past Medical History:  Diagnosis Date  . Anxiety   . Bipolar disorder (HCC)    no med currently  . Depression   . Hypoglycemic syndrome   . Migraines    otc med prn. with aura   . Poor fetal growth affecting management of mother in third trimester    Family History  Problem Relation Age of Onset  . Ovarian cysts Mother   . Stroke Maternal Grandmother   . Cancer Maternal Grandmother 27       uterine cancer  . Lung cancer Paternal Grandfather   . Diabetes Maternal Great-grandmother   . Lung cancer Maternal Great-grandmother    Past Surgical History:  Procedure Laterality Date  . abscess tooth    . CESAREAN SECTION N/A 10/01/2014   Procedure: PRIMARY CESAREAN SECTION;  Surgeon: Adam Phenix, MD;  Location: [facility];  Service: Obstetrics;  Laterality: N/A;  . TONSILLECTOMY    . WISDOM TOOTH EXTRACTION     Patient Active Problem List   Diagnosis Date Noted  . 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 Generalized anxiety disorder with panic attacks 08/31/2018  . Depression 02/21/2015  . Dandruff 06/22/2013  . Obesity 07/04/2012      Prior to Admission medications   Medication Sig Start Date End Date Taking? Authorizing Provider   amoxicillin-clavulanate (AUGMENTIN) 875-125 MG tablet Take 1 tablet by mouth 2 (two) times daily. 09/27/18   Poulose, Percell Belt, NP  escitalopram (LEXAPRO) 10 MG tablet Take 1 tablet (10 mg total) by mouth daily. 09/22/18   Lada, Janit Bern, MD  ketoconazole (NIZORAL) 2 % shampoo Apply 1 application topically 2 (two) times a week. Use this instead of the prescription lotions 09/25/18   Lada, Janit Bern, MD  loratadine (CLARITIN) 10 MG tablet Take 1 tablet (10 mg total) by mouth daily. 09/27/18   Poulose, Percell Belt, NP  LORazepam (ATIVAN) 1 MG tablet Take 0.5-1 tablets (0.5-1 mg total) by mouth every 8 (eight) hours as needed for anxiety. **NO controlled substances from [facility] at this time** 09/01/18   Lada, Janit Bern, MD  magic mouthwash w/lidocaine SOLN Take 5 mLs by mouth 3 (three) times daily as needed for mouth pain. 09/27/18   Poulose, Percell Belt, NP  norethindrone (MICRONOR,CAMILA,ERRIN) 0.35 MG tablet Take 1 tablet (0.35 mg total) by mouth daily. 07/10/18   Allie Bossier, MD  prenatal vitamin w/FE, FA (NATACHEW) 29-1 MG CHEW chewable tablet Chew 1 tablet by mouth daily at 12 noon. 08/02/14   Reva Bores, MD    Allergies Sulfa drugs cross reactors    Social History Social History   Tobacco Use  . Smoking status: Never  Smoker  . Smokeless tobacco: Never Used  Substance Use Topics  . Alcohol use: Yes    Alcohol/week: 0.0 standard drinks    Comment: rare but none with pregnancy  . Drug use: No    Review of Systems Patient denies headaches, rhinorrhea, blurry vision, numbness, shortness of breath, chest pain, edema, cough, abdominal pain, nausea, vomiting, diarrhea, dysuria, fevers, rashes or hallucinations unless otherwise stated above in HPI. ____________________________________________   PHYSICAL EXAM:  VITAL SIGNS: Vitals:   06/27/19 1935 06/27/19 2011  BP: (!) 147/90 138/77  Pulse: (!) 113 97  Resp: 18   Temp: 98.8 F (37.1 C)   SpO2: 99% 100%    Constitutional:  Alert and oriented.  Eyes: Conjunctivae are normal.  Head: Atraumatic. Nose: No congestion/rhinnorhea. Mouth/Throat: Mucous membranes are moist.   Neck: No stridor. Painless ROM.  Cardiovascular: Normal rate, regular rhythm. Grossly normal heart sounds.  Good peripheral circulation. Respiratory: Normal respiratory effort.  No retractions. Lungs CTAB. Gastrointestinal: Soft and nontender. No distention. No abdominal bruits. No CVA tenderness. Genitourinary:  Musculoskeletal: No lower extremity tenderness nor edema.  No joint effusions. Neurologic:  Normal speech and language. No gross focal neurologic deficits are appreciated. No facial droop Skin:  Skin is warm, dry and intact. No rash noted. Psychiatric: Mood and affect are normal. Speech and behavior are normal.  ____________________________________________   LABS (all labs ordered are listed, but only abnormal results are displayed)  Results for orders placed or performed during the hospital encounter of 06/27/19 (from the past 24 hour(s))  Lipase, blood     Status: None   Collection Time: 06/27/19  7:42 PM  Result Value Ref Range   Lipase 31 11 - 51 U/L  Comprehensive metabolic panel     Status: Abnormal   Collection Time: 06/27/19  7:42 PM  Result Value Ref Range   Sodium 138 135 - 145 mmol/L   Potassium 3.5 3.5 - 5.1 mmol/L   Chloride 105 98 - 111 mmol/L   CO2 23 22 - 32 mmol/L   Glucose, Bld 110 (H) 70 - 99 mg/dL   BUN 8 6 - 20 mg/dL   Creatinine, Ser 4.090.84 0.44 - 1.00 mg/dL   Calcium 9.1 8.9 - 81.110.3 mg/dL   Total Protein 7.4 6.5 - 8.1 g/dL   Albumin 4.1 3.5 - 5.0 g/dL   AST 14 (L) 15 - 41 U/L   ALT 16 0 - 44 U/L   Alkaline Phosphatase 54 38 - 126 U/L   Total Bilirubin 0.5 0.3 - 1.2 mg/dL   GFR calc non Af Amer >60 >60 mL/min   GFR calc Af Amer >60 >60 mL/min   Anion gap 10 5 - 15  CBC     Status: Abnormal   Collection Time: 06/27/19  7:42 PM  Result Value Ref Range   WBC 11.3 (H) 4.0 - 10.5 K/uL   RBC 4.23 3.87  - 5.11 MIL/uL   Hemoglobin 12.7 12.0 - 15.0 g/dL   HCT 91.437.4 78.236.0 - 95.646.0 %   MCV 88.4 80.0 - 100.0 fL   MCH 30.0 26.0 - 34.0 pg   MCHC 34.0 30.0 - 36.0 g/dL   RDW 21.311.9 08.611.5 - 57.815.5 %   Platelets 285 150 - 400 K/uL   nRBC 0.0 0.0 - 0.2 %  Urinalysis, Complete w Microscopic     Status: Abnormal   Collection Time: 06/27/19  7:42 PM  Result Value Ref Range   Color, Urine YELLOW (A) YELLOW   APPearance  CLEAR (A) CLEAR   Specific Gravity, Urine 1.019 1.005 - 1.030   pH 6.0 5.0 - 8.0   Glucose, UA NEGATIVE NEGATIVE mg/dL   Hgb urine dipstick SMALL (A) NEGATIVE   Bilirubin Urine NEGATIVE NEGATIVE   Ketones, ur NEGATIVE NEGATIVE mg/dL   Protein, ur NEGATIVE NEGATIVE mg/dL   Nitrite NEGATIVE NEGATIVE   Leukocytes,Ua NEGATIVE NEGATIVE   RBC / HPF 0-5 0 - 5 RBC/hpf   WBC, UA 0-5 0 - 5 WBC/hpf   Bacteria, UA NONE SEEN NONE SEEN   Squamous Epithelial / LPF 0-5 0 - 5   Mucus PRESENT   Pregnancy, urine POC     Status: Abnormal   Collection Time: 06/27/19  7:52 PM  Result Value Ref Range   Preg Test, Ur POSITIVE (A) NEGATIVE  hCG, quantitative, pregnancy     Status: Abnormal   Collection Time: 06/27/19  7:57 PM  Result Value Ref Range   hCG, Beta Chain, Quant, S 6,507 (H) <5 mIU/mL   _____________________________________  RADIOLOGY  I personally reviewed all radiographic images ordered to evaluate for the above acute complaints and reviewed radiology reports and findings.  These findings were personally discussed with the patient.  Please see medical record for radiology report.  ____________________________________________   PROCEDURES  Procedure(s) performed:  .Critical Care Performed by: Merlyn Lot, MD Authorized by: Merlyn Lot, MD   Critical care provider statement:    Critical care time (minutes):  30   Critical care time was exclusive of:  Separately billable procedures and treating other patients   Critical care was necessary to treat or prevent imminent or  life-threatening deterioration of the following conditions: ruptured ectopic.   Critical care was time spent personally by me on the following activities:  Development of treatment plan with patient or surrogate, discussions with consultants, evaluation of patient's response to treatment, examination of patient, obtaining history from patient or surrogate, ordering and performing treatments and interventions, ordering and review of laboratory studies, ordering and review of radiographic studies, pulse oximetry, re-evaluation of patient's condition and review of old charts      Critical Care performed: yes ____________________________________________   INITIAL IMPRESSION / Emlyn / ED COURSE  Pertinent labs & imaging results that were available during my care of the patient were reviewed by me and considered in my medical decision making (see chart for details).   DDX: ectopic, molar,  ovarian cyst, cystitis, appendicitis, colitis, constipation  Adiva Boettner is a 28 y.o. who presents to the ED with acute pelvic pain in the setting of early pregnancy.  Blood work and [place] will be ordered for the above differential.    Clinical Course as of Jun 26 2237  Wed Jun 27, 2019  2226 Late note:  have paged OB GYN regarding concerning [place]  will order rhogam.   [PR]  2233 Patient remains hemodynamically stable.    [PR]  2237 Patient discussed in consultation with Dr. Gilman Schmidt of Ob/gyn who will evaluate patient at bedside.     [PR]    Clinical Course User Index [PR] Merlyn Lot, MD    The patient was evaluated in Emergency Department today for the symptoms described in the history of present illness. He/she was evaluated in the context of the global COVID-19 pandemic, which necessitated consideration that the patient might be at risk for infection with the SARS-CoV-2 virus that causes COVID-19. Institutional protocols and algorithms that pertain to the evaluation of patients at  risk for COVID-19 are in  a state of rapid change based on information released by regulatory bodies including the CDC and federal and state organizations. These policies and algorithms were followed during the patient's care in the ED.  As part of my medical decision making, I reviewed the following data within the electronic MEDICAL RECORD NUMBER Nursing notes reviewed and incorporated, Labs reviewed, notes from prior ED visits and [place] Controlled Substance Database   ____________________________________________   FINAL CLINICAL IMPRESSION(S) / ED DIAGNOSES  Final diagnoses:  Pelvic pain affecting pregnancy      NEW MEDICATIONS STARTED DURING THIS VISIT:  New Prescriptions   No medications on file     Note:  This document was prepared using Dragon voice recognition software and may include unintentional dictation errors. Willy Eddy, MD 06/27/19 2238

## 2019-06-27 NOTE — ED Notes (Signed)
Patient transported to Ultrasound 

## 2019-06-27 NOTE — ED Notes (Signed)
Consent for surgery obtained and witnessed by this RN. Dr. Nechama Guard at bedside at this time.

## 2019-06-27 NOTE — ED Notes (Signed)
Pt undressed and placed into hospital gown per policy. All patient belongings given to pt's mom as patient took them off.

## 2019-06-27 NOTE — ED Notes (Signed)
Katie, RN attempted for 20g IV at L upper arm.

## 2019-06-28 ENCOUNTER — Encounter: Payer: Self-pay | Admitting: Obstetrics and Gynecology

## 2019-06-28 DIAGNOSIS — O00101 Right tubal pregnancy without intrauterine pregnancy: Secondary | ICD-10-CM | POA: Diagnosis not present

## 2019-06-28 DIAGNOSIS — O26891 Other specified pregnancy related conditions, first trimester: Secondary | ICD-10-CM | POA: Diagnosis not present

## 2019-06-28 DIAGNOSIS — Z20828 Contact with and (suspected) exposure to other viral communicable diseases: Secondary | ICD-10-CM | POA: Diagnosis not present

## 2019-06-28 DIAGNOSIS — F418 Other specified anxiety disorders: Secondary | ICD-10-CM | POA: Diagnosis not present

## 2019-06-28 DIAGNOSIS — N83202 Unspecified ovarian cyst, left side: Secondary | ICD-10-CM | POA: Diagnosis not present

## 2019-06-28 DIAGNOSIS — Z9889 Other specified postprocedural states: Secondary | ICD-10-CM

## 2019-06-28 DIAGNOSIS — R102 Pelvic and perineal pain: Secondary | ICD-10-CM | POA: Diagnosis not present

## 2019-06-28 DIAGNOSIS — O3481 Maternal care for other abnormalities of pelvic organs, first trimester: Secondary | ICD-10-CM | POA: Diagnosis not present

## 2019-06-28 DIAGNOSIS — Z03818 Encounter for observation for suspected exposure to other biological agents ruled out: Secondary | ICD-10-CM | POA: Diagnosis not present

## 2019-06-28 DIAGNOSIS — O009 Unspecified ectopic pregnancy without intrauterine pregnancy: Secondary | ICD-10-CM | POA: Diagnosis not present

## 2019-06-28 DIAGNOSIS — Z3A00 Weeks of gestation of pregnancy not specified: Secondary | ICD-10-CM | POA: Diagnosis not present

## 2019-06-28 DIAGNOSIS — K661 Hemoperitoneum: Secondary | ICD-10-CM | POA: Diagnosis not present

## 2019-06-28 LAB — CBC
HCT: 37.6 % (ref 36.0–46.0)
Hemoglobin: 12.6 g/dL (ref 12.0–15.0)
MCH: 29.7 pg (ref 26.0–34.0)
MCHC: 33.5 g/dL (ref 30.0–36.0)
MCV: 88.7 fL (ref 80.0–100.0)
Platelets: 275 10*3/uL (ref 150–400)
RBC: 4.24 MIL/uL (ref 3.87–5.11)
RDW: 11.9 % (ref 11.5–15.5)
WBC: 12.8 10*3/uL — ABNORMAL HIGH (ref 4.0–10.5)
nRBC: 0 % (ref 0.0–0.2)

## 2019-06-28 MED ORDER — OXYCODONE HCL 5 MG PO TABS: 5.0000 mg | ORAL_TABLET | ORAL | Status: DC | PRN

## 2019-06-28 MED ORDER — BUPIVACAINE HCL 0.5 % IJ SOLN
INTRAMUSCULAR | Status: DC | PRN
  Administered 2019-06-28: 30 mL

## 2019-06-28 MED ORDER — METOCLOPRAMIDE HCL 5 MG PO TABS: 5.0000 mg | ORAL_TABLET | Freq: Three times a day (TID) | ORAL | 1 refills | Status: DC

## 2019-06-28 MED ORDER — IBUPROFEN 800 MG PO TABS: 800.0000 mg | ORAL_TABLET | Freq: Three times a day (TID) | ORAL | 1 refills | Status: DC

## 2019-06-28 MED ORDER — ACETAMINOPHEN 500 MG PO TABS: 1000.0000 mg | ORAL_TABLET | Freq: Four times a day (QID) | ORAL | 1 refills | Status: DC

## 2019-06-28 MED ORDER — MIDAZOLAM HCL 2 MG/2ML IJ SOLN
INTRAMUSCULAR | Status: DC | PRN
  Administered 2019-06-27 – 2019-06-28 (×2): 2 mg via INTRAVENOUS

## 2019-06-28 MED ORDER — OXYCODONE HCL 5 MG PO TABS: 5.0000 mg | ORAL_TABLET | ORAL | 0 refills | Status: DC | PRN

## 2019-06-28 MED ORDER — FENTANYL CITRATE (PF) 100 MCG/2ML IJ SOLN
25.0000 ug | INTRAMUSCULAR | Status: DC | PRN
  Administered 2019-06-28: 50 ug via INTRAVENOUS
  Administered 2019-06-28 (×2): 25 ug via INTRAVENOUS

## 2019-06-28 MED ORDER — ONDANSETRON HCL 4 MG/2ML IJ SOLN
INTRAMUSCULAR | Status: DC | PRN
  Administered 2019-06-28: 4 mg via INTRAVENOUS

## 2019-06-28 MED ORDER — PROMETHAZINE HCL 25 MG/ML IJ SOLN: 6.2500 mg | INTRAMUSCULAR | Status: DC | PRN

## 2019-06-28 MED ORDER — IBUPROFEN 800 MG PO TABS
800.0000 mg | ORAL_TABLET | Freq: Three times a day (TID) | ORAL | Status: DC
  Administered 2019-06-28 (×2): 800 mg via ORAL
  Filled 2019-06-28 (×2): qty 1

## 2019-06-28 MED ORDER — MORPHINE SULFATE (PF) 2 MG/ML IV SOLN: 1.0000 mg | INTRAVENOUS | Status: DC | PRN

## 2019-06-28 MED ORDER — SUGAMMADEX SODIUM 200 MG/2ML IV SOLN
INTRAVENOUS | Status: DC | PRN
  Administered 2019-06-28: 200 mg via INTRAVENOUS

## 2019-06-28 MED ORDER — FENTANYL CITRATE (PF) 100 MCG/2ML IJ SOLN
INTRAMUSCULAR | Status: AC
  Administered 2019-06-28: 25 ug via INTRAVENOUS
  Filled 2019-06-28: qty 2

## 2019-06-28 MED ORDER — ONDANSETRON HCL 4 MG/2ML IJ SOLN: 4.0000 mg | Freq: Four times a day (QID) | INTRAMUSCULAR | Status: DC | PRN

## 2019-06-28 MED ORDER — LACTATED RINGERS IV SOLN
INTRAVENOUS | Status: DC | PRN
  Administered 2019-06-27: 23:00:00 via INTRAVENOUS

## 2019-06-28 MED ORDER — OXYCODONE HCL 5 MG/5ML PO SOLN: 5.0000 mg | Freq: Once | ORAL | Status: DC | PRN

## 2019-06-28 MED ORDER — SIMETHICONE 80 MG PO CHEW
80.0000 mg | CHEWABLE_TABLET | Freq: Four times a day (QID) | ORAL | Status: DC | PRN
  Administered 2019-06-28: 80 mg via ORAL
  Filled 2019-06-28: qty 1

## 2019-06-28 MED ORDER — ONDANSETRON HCL 4 MG PO TABS: 4.0000 mg | ORAL_TABLET | Freq: Four times a day (QID) | ORAL | Status: DC | PRN

## 2019-06-28 MED ORDER — LACTATED RINGERS IR SOLN
Status: DC | PRN
  Administered 2019-06-28: 3000 mL

## 2019-06-28 MED ORDER — OXYCODONE HCL 5 MG PO TABS: 5.0000 mg | ORAL_TABLET | Freq: Once | ORAL | Status: DC | PRN

## 2019-06-28 MED ORDER — ACETAMINOPHEN 500 MG PO TABS
1000.0000 mg | ORAL_TABLET | Freq: Four times a day (QID) | ORAL | Status: DC
  Administered 2019-06-28 (×2): 1000 mg via ORAL
  Filled 2019-06-28 (×2): qty 2

## 2019-06-28 MED ORDER — METOCLOPRAMIDE HCL 10 MG PO TABS: 5.0000 mg | ORAL_TABLET | Freq: Three times a day (TID) | ORAL | Status: DC

## 2019-06-28 MED ORDER — MIDAZOLAM HCL 2 MG/2ML IJ SOLN
INTRAMUSCULAR | Status: AC
  Filled 2019-06-28: qty 2

## 2019-06-28 MED ORDER — ROCURONIUM BROMIDE 50 MG/5ML IV SOLN
INTRAVENOUS | Status: AC
  Filled 2019-06-28: qty 1

## 2019-06-28 MED ORDER — POTASSIUM CHLORIDE IN NACL 20-0.45 MEQ/L-% IV SOLN
INTRAVENOUS | Status: DC
  Administered 2019-06-28 (×2): via INTRAVENOUS
  Filled 2019-06-28 (×5): qty 1000

## 2019-06-28 MED ORDER — RHO D IMMUNE GLOBULIN 1500 UNIT/2ML IJ SOSY
300.0000 ug | PREFILLED_SYRINGE | Freq: Once | INTRAMUSCULAR | Status: AC
  Administered 2019-06-28: 300 ug via INTRAVENOUS
  Filled 2019-06-28: qty 2

## 2019-06-28 NOTE — Anesthesia Post-op Follow-up Note (Signed)
Anesthesia QCDR form completed.        

## 2019-06-28 NOTE — Anesthesia Postprocedure Evaluation (Signed)
Anesthesia Post Note  Patient: Issabella Rix  Procedure(s) Performed: DIAGNOSTIC LAPAROSCOPY , evacuation or hemoperitoneum (N/A Abdomen) LAPAROSCOPIC UNILATERAL SALPINGECTOMY (Right )  Patient location during evaluation: PACU Anesthesia Type: General Level of consciousness: awake and alert Pain management: pain level controlled Vital Signs Assessment: post-procedure vital signs reviewed and stable Respiratory status: spontaneous breathing, nonlabored ventilation, respiratory function stable and patient connected to nasal cannula oxygen Cardiovascular status: blood pressure returned to baseline and stable Postop Assessment: no apparent nausea or vomiting Anesthetic complications: no     Last Vitals:  Vitals:   06/28/19 0234 06/28/19 0406  BP: 116/72 139/84  Pulse: 96 93  Resp: 18 18  Temp:  37.1 C  SpO2: 98% 99%    Last Pain:  Vitals:   06/28/19 0219  TempSrc:   PainSc: 3                  Precious Haws Piscitello

## 2019-06-28 NOTE — Progress Notes (Signed)
Patient discharged home. Discharge instructions, prescriptions and follow up appointment given to and reviewed with patient. Patient verbalized understanding. Patient wheeled out by RN 

## 2019-06-28 NOTE — Transfer of Care (Signed)
Immediate Anesthesia Transfer of Care Note  Patient: Emily Payne  Procedure(s) Performed: DIAGNOSTIC LAPAROSCOPY , evacuation or hemoperitoneum (N/A Abdomen) LAPAROSCOPIC UNILATERAL SALPINGECTOMY (Right )  Patient Location: PACU  Anesthesia Type:General  Level of Consciousness: drowsy and patient cooperative  Airway & Oxygen Therapy: Patient Spontanous Breathing and Patient connected to face mask oxygen  Post-op Assessment: Report given to RN and Post -op Vital signs reviewed and stable  Post vital signs: Reviewed and stable  Last Vitals:  Vitals Value Taken Time  BP 121/73 06/28/19 0121  Temp 36.7 C 06/28/19 0119  Pulse 112 06/28/19 0121  Resp 19 06/28/19 0121  SpO2 98 % 06/28/19 0121  Vitals shown include unvalidated device data.  Last Pain:  Vitals:   06/27/19 2004  TempSrc:   PainSc: 8          Complications: No apparent anesthesia complications

## 2019-06-28 NOTE — Anesthesia Procedure Notes (Signed)
Procedure Name: Intubation Date/Time: 06/28/2019 11:38 PM Performed by: Jonna Clark, CRNA Pre-anesthesia Checklist: Patient identified, Patient being monitored, Timeout performed, Emergency Drugs available and Suction available Patient Re-evaluated:Patient Re-evaluated prior to induction Oxygen Delivery Method: Circle system utilized Preoxygenation: Pre-oxygenation with 100% oxygen Induction Type: IV induction Ventilation: Mask ventilation without difficulty Laryngoscope Size: Mac and 3 Grade View: Grade I Tube type: Oral Tube size: 7.0 mm Number of attempts: 1 Airway Equipment and Method: Stylet Placement Confirmation: ETT inserted through vocal cords under direct vision,  positive ETCO2 and breath sounds checked- equal and bilateral Secured at: 21 cm Tube secured with: Tape Dental Injury: Teeth and Oropharynx as per pre-operative assessment

## 2019-06-28 NOTE — Op Note (Signed)
  Operative Note   06/28/2019  PRE-OP DIAGNOSIS:Ruptured Ectopic Pregnancy  POST-OP DIAGNOSIS: same   PROCEDURE: Procedure(s): DIAGNOSTIC LAPAROSCOPY , evacuation or hemoperitoneum LAPAROSCOPIC RIGHT SALPINGECTOMY  LEFT OVARIAN CYSTECTOMY   SURGEON: Jerrald Doverspike MD  ANESTHESIA: General   ESTIMATED BLOOD LOSS: 10 cc  HEMOPERITONEUM: 242 cc  COMPLICATIONS: None  DISPOSITION: PACU - hemodynamically stable.  CONDITION: stable  FINDINGS: Laparoscopic survey of the abdomen revealed a grossly normal uterus, liver edge, gallbladder edge and appendix, No intra-abdominal adhesions were noted.  Hemoperitoneum. Right fallopian tube was ruptured and actively bleeding. It seemed that the tubal pregnancy had ruptured into the abdomen. The right ovary was normal. The left ovary had a large simple cyst.   PROCEDURE IN DETAIL: The patient was taken to the OR where anesthesia was administed. The patient was positioned in dorsal lithotomy in the [place]. The patient was then examined under anesthesia with the above noted findings. The patient was prepped and draped in the normal sterile fashion and bladder was drained using a foley catheter. A sponge stick was placed into the vagina to manipulate the uterus.   Attention was turned to the patient's abdomen where a 5 mm skin incision was made in the umbilical fold, after injection of local anesthesia.  The 5 mm port was then placed under direct visualization with the operative laparoscope  The above noted findings. Pneumoperitoneum was obtained. Patient positioned into Trendelenburg.  A 5 mm trocar was then placed in the left and right lower quadrants under direct visualization with the laparoscope.  The hemoperitoneum was evacuated with the suction irrigator. The right fallopian tube had a ruptured area close to the cornua which was actively bleeding. It seems that the products of conception had already been expelled from this tubal site.   The right fallopian tube was actively pulsing blood and the ligasure was used to excise the right fallopian tube in entirety.  The left ovarian cyst was then incised and drained. An area of bleeding was coagulated with the Ligasure.  The abdomen was copiously irrigated. Hemostasis was observed. The appendix and liver were normal.   All instruments and ports were then removed from the abdomen after gas was expelled and patient was leveled.   The skin was closed with 4-0 monocryl and skin adhesive. The foley catheter was removed from the bladder. The hulka manipulator was removed from the uterus.  The patient tolerated the procedure well. All counts were correct x 2. The patient was transferred to the recovery room awake, alert and breathing independently.  Adrian Prows MD [facility], [facility] 06/28/2019 2:23 AM

## 2019-06-28 NOTE — Discharge Instructions (Signed)
Diagnostic Laparoscopy, Care After °This sheet gives you information about how to care for yourself after your procedure. Your health care provider may also give you more specific instructions. If you have problems or questions, contact your health care provider. °What can I expect after the procedure? °After the procedure, it is common to have: °· Mild discomfort in the abdomen. °· Sore throat. °Women who have laparoscopy with pelvic examination may have mild cramping and fluid coming from the vagina for a few days after the procedure. °Follow these instructions at home: °Medicines °· Take over-the-counter and prescription medicines only as told by your health care provider. °· If you were prescribed an antibiotic medicine, take it as told by your health care provider. Do not stop taking the antibiotic even if you start to feel better. °Driving °· Do not drive for 24 hours if you were given a medicine to help you relax (sedative) during your procedure. °· Do not drive or use heavy machinery while taking prescription pain medicine. °Bathing °· Do not take baths, swim, or use a hot tub until your health care provider approves. You may take showers. °Incision care ° °· Follow instructions from your health care provider about how to take care of your incisions. Make sure you: °? Wash your hands with soap and water before you change your bandage (dressing). If soap and water are not available, use hand sanitizer. °? Change your dressing as told by your health care provider. °? Leave stitches (sutures), skin glue, or adhesive strips in place. These skin closures may need to stay in place for 2 weeks or longer. If adhesive strip edges start to loosen and curl up, you may trim the loose edges. Do not remove adhesive strips completely unless your health care provider tells you to do that. °· Check your incision areas every day for signs of infection. Check for: °? Redness, swelling, or pain. °? Fluid or  blood. °? Warmth. °? Pus or a bad smell. °Activity °· Return to your normal activities as told by your health care provider. Ask your health care provider what activities are safe for you. °· Do not lift anything that is heavier than 10 lb (4.5 kg), or the limit that you are told, until your health care provider says that it is safe. °General instructions °· To prevent or treat constipation while you are taking prescription pain medicine, your health care provider may recommend that you: °? Drink enough fluid to keep your urine pale yellow. °? Take over-the-counter or prescription medicines. °? Eat foods that are high in fiber, such as fresh fruits and vegetables, whole grains, and beans. °? Limit foods that are high in fat and processed sugars, such as fried and sweet foods. °· Do not use any products that contain nicotine or tobacco, such as cigarettes and e-cigarettes. If you need help quitting, ask your health care provider. °· Keep all follow-up visits as told by your health care provider. This is important. °Contact a health care provider if: °· You develop shoulder pain. °· You feel lightheaded or faint. °· You are unable to pass gas or have a bowel movement. °· You feel nauseous or you vomit. °· You develop a rash. °· You have redness, swelling, or pain around any incision. °· You have fluid or blood coming from any incision. °· Any incision feels warm to the touch. °· You have pus or a bad smell coming from any incision. °· You have a fever or chills. °Get help   right away if: °· You have severe pain. °· You have vomiting that does not go away. °· You have heavy bleeding from the vagina. °· Any incision opens. °· You have trouble breathing. °· You have chest pain. °Summary °· After the procedure, it is common to have mild discomfort in the abdomen and a sore throat. °· Check your incision areas every day for signs of infection. °· Return to your normal activities as told by your health care provider. Ask  your health care provider what activities are safe for you. °This information is not intended to replace advice given to you by your health care provider. Make sure you discuss any questions you have with your health care provider. °Document Released: 08/18/2015 Document Revised: 08/19/2017 Document Reviewed: 03/02/2017 °Elsevier Patient Education © 2020 [employer] ° ° °Ectopic Pregnancy ° °An ectopic pregnancy is when the fertilized egg attaches (implants) outside the uterus. Most ectopic pregnancies occur in one of the tubes where eggs travel from the ovary to the uterus (fallopian tubes), but the implanting can occur in other locations. In rare cases, ectopic pregnancies occur on the ovary, intestine, pelvis, abdomen, or cervix. In an ectopic pregnancy, the fertilized egg does not have the ability to develop into a normal, healthy baby. °A ruptured ectopic pregnancy is one in which tearing or bursting of a fallopian tube causes internal bleeding. Often, there is intense lower abdominal pain, and vaginal bleeding sometimes occurs. Having an ectopic pregnancy can be life-threatening. If this dangerous condition is not treated, it can lead to blood loss, shock, or even death. °What are the causes? °The most common cause of this condition is damage to one of the fallopian tubes. A fallopian tube may be narrowed or blocked, and that keeps the fertilized egg from reaching the uterus. °What increases the risk? °This condition is more likely to develop in women of childbearing age who have different levels of risk. The levels of risk can be divided into three categories. °High risk °· You have gone through infertility treatment. °· You have had an ectopic pregnancy before. °· You have had surgery on the fallopian tubes, or another surgical procedure, such as an abortion. °· You have had surgery to have the fallopian tubes tied (tubal ligation). °· You have problems or diseases of the fallopian tubes. °· You have been  exposed to diethylstilbestrol (DES). This medicine was used until 1971, and it had effects on babies whose mothers took the medicine. °· You become pregnant while using an IUD (intrauterine device) for birth control. °Moderate risk °· You have a history of infertility. °· You have had an STI (sexually transmitted infection). °· You have a history of pelvic inflammatory disease (PID). °· You have scarring from endometriosis. °· You have multiple sexual partners. °· You smoke. °Low risk °· You have had pelvic surgery. °· You use vaginal douches. °· You became sexually active before age 18. °What are the signs or symptoms? °Common symptoms of this condition include normal pregnancy symptoms, such as missing a period, nausea, tiredness, abdominal pain, breast tenderness, and bleeding. However, ectopic pregnancy will have additional symptoms, such as: °· Pain with intercourse. °· Irregular vaginal bleeding or spotting. °· Cramping or pain on one side or in the lower abdomen. °· Fast heartbeat, low blood pressure, and sweating. °· Passing out while having a bowel movement. °Symptoms of a ruptured ectopic pregnancy and internal bleeding may include: °· Sudden, severe pain in the abdomen and pelvis. °· Dizziness, weakness, light-headedness,   or fainting. °· Pain in the shoulder or neck area. °How is this diagnosed? °This condition is diagnosed by: °· A pelvic exam to locate pain or a mass in the abdomen. °· A pregnancy test. This blood test checks for the presence as well as the specific level of pregnancy hormone in the bloodstream. °· Ultrasound. This is performed if a pregnancy test is positive. In this test, a probe is inserted into the vagina. The probe will detect a fetus, possibly in a location other than the uterus. °· Taking a sample of uterus tissue (dilation and curettage, or D&C). °· Surgery to perform a visual exam of the inside of the abdomen using a thin, lighted tube that has a tiny camera on the end  (laparoscope). °· Culdocentesis. This procedure involves inserting a needle at the top of the vagina, behind the uterus. If blood is present in this area, it may indicate that a fallopian tube is torn. °How is this treated? °This condition is treated with medicine or surgery. °Medicine °· An injection of a medicine (methotrexate) may be given to cause the pregnancy tissue to be absorbed. This medicine may save your fallopian tube. It may be given if: °? The diagnosis is made early, with no signs of active bleeding. °? The fallopian tube has not ruptured. °? You are considered to be a good candidate for the medicine. °Usually, pregnancy hormone blood levels are checked after methotrexate treatment. This is to be sure that the medicine is effective. It may take 4-6 weeks for the pregnancy to be absorbed. Most pregnancies will be absorbed by 3 weeks. °Surgery °· A laparoscope may be used to remove the pregnancy tissue. °· If severe internal bleeding occurs, a larger cut (incision) may be made in the lower abdomen (laparotomy) to remove the fetus and placenta. This is done to stop the bleeding. °· Part or all of the fallopian tube may be removed (salpingectomy) along with the fetus and placenta. The fallopian tube may also be repaired during the surgery. °· In very rare circumstances, removal of the uterus (hysterectomy) may be required. °· After surgery, pregnancy hormone testing may be done to be sure that there is no pregnancy tissue left. °Whether your treatment is medicine or surgery, you may receive a Rho (D) immune globulin shot to prevent problems with any future pregnancy. This shot may be given if: °· You are Rh-negative and the baby's father is Rh-positive. °· You are Rh-negative and you do not know the Rh type of the baby's father. °Follow these instructions at home: °· Rest and limit your activity after the procedure for as long as told by your health care provider. °· Until your health care provider says  that it is safe: °? Do not lift anything that is heavier than 10 lb (4.5 kg), or the limit that your health care provider tells you. °? Avoid physical exercise and any movement that requires effort (is strenuous). °· To help prevent constipation: °? Eat a healthy diet that includes fruits, vegetables, and whole grains. °? Drink 6-8 glasses of water per day. °Get help right away if: °· You develop worsening pain that is not relieved by medicine. °· You have: °? A fever or chills. °? Vaginal bleeding. °? Redness and swelling at the incision site. °? Nausea and vomiting. °· You feel dizzy or weak. °· You feel light-headed or you faint. °This information is not intended to replace advice given to you by your health care provider. Make sure   you discuss any questions you have with your health care provider. °Document Released: 10/14/2004 Document Revised: 08/19/2017 Document Reviewed: 04/07/2016 °Elsevier Patient Education © 2020 Elsevier Inc. ° °

## 2019-06-28 NOTE — Plan of Care (Signed)
Pt. Transferred to room 347 from PACU. Alert and oriented with pleasant affect. Color sl. Pale, skin w&d. Assisted up to [facility] and tolerated well. Oriented to room and Fall Precautions.

## 2019-06-28 NOTE — Progress Notes (Signed)
Emily Payne is a 28 y.o. female patient.  1. Pelvic pain affecting pregnancy    No complaints. She is resting comfortably in bed. Her pain is controlled with medication.   Past Medical History:  Diagnosis Date  . Anxiety   . Bipolar disorder (HCC)    no med currently  . Depression   . Hypoglycemic syndrome   . Migraines    otc med prn. with aura   . Poor fetal growth affecting management of mother in third trimester     Current Facility-Administered Medications  Medication Dose Route Frequency Provider Last Rate Last Dose  . 0.45 % NaCl with KCl 20 mEq / L infusion   Intravenous Continuous Schuman, Christanna R, MD 125 mL/hr at 06/28/19 1154    . acetaminophen (TYLENOL) tablet 1,000 mg  1,000 mg Oral Q6H Schuman, Christanna R, MD   1,000 mg at 06/28/19 1151  . ibuprofen (ADVIL) tablet 800 mg  800 mg Oral Q8H Schuman, Christanna R, MD   800 mg at 06/28/19 0543  . metoCLOPramide (REGLAN) tablet 5 mg  5 mg Oral TID AC Schuman, Christanna R, MD      . morphine 2 MG/ML injection 1-2 mg  1-2 mg Intravenous Q3H PRN Schuman, Christanna R, MD      . ondansetron (ZOFRAN) tablet 4 mg  4 mg Oral Q6H PRN Schuman, Christanna R, MD       Or  . ondansetron (ZOFRAN) injection 4 mg  4 mg Intravenous Q6H PRN Schuman, Christanna R, MD      . oxyCODONE (Oxy IR/ROXICODONE) immediate release tablet 5-10 mg  5-10 mg Oral Q4H PRN Schuman, Christanna R, MD      . simethicone (MYLICON) chewable tablet 80 mg  80 mg Oral QID PRN Schuman, Christanna R, MD   80 mg at 06/28/19 0543   Allergies  Allergen Reactions  . Sulfa Drugs Cross Reactors Anaphylaxis, Itching and Other (See Comments)    Flu like symptoms   Active Problems:   S/P laparoscopic surgery  Blood pressure 126/78, pulse 88, temperature 98.5 F (36.9 C), temperature source Oral, resp. rate 18, height 5\' 9"  (1.753 m), weight 97.5 kg, last menstrual period 05/13/2019, SpO2 99 %.  Review of Systems  Constitutional: Negative for chills, fever,  malaise/fatigue and weight loss.  HENT: Negative for congestion, hearing loss and sinus pain.   Eyes: Negative for blurred vision and double vision.  Respiratory: Negative for cough, sputum production, shortness of breath and wheezing.   Cardiovascular: Negative for chest pain, palpitations, orthopnea and leg swelling.  Gastrointestinal: Negative for abdominal pain, constipation, diarrhea, nausea and vomiting.  Genitourinary: Negative for dysuria, flank pain, frequency, hematuria and urgency.  Musculoskeletal: Negative for back pain, falls and joint pain.  Skin: Negative for itching and rash.  Neurological: Negative for dizziness and headaches.  Psychiatric/Behavioral: Negative for depression, substance abuse and suicidal ideas. The patient is not nervous/anxious.     Physical Exam Vitals signs and nursing note reviewed.  Constitutional:      Appearance: She is well-developed.  HENT:     Head: Normocephalic and atraumatic.  Eyes:     Pupils: Pupils are equal, round, and reactive to light.  Cardiovascular:     Rate and Rhythm: Normal rate and regular rhythm.  Pulmonary:     Effort: Pulmonary effort is normal. No respiratory distress.  Abdominal:     General: Abdomen is flat.     Tenderness: There is abdominal tenderness. There is no guarding or rebound.  Comments: Incisions intact and dry. No erythema. No induration  Skin:    General: Skin is warm and dry.  Neurological:     Mental Status: She is alert and oriented to person, place, and time.  Psychiatric:        Behavior: Behavior normal.        Thought Content: Thought content normal.        Judgment: Judgment normal.     28 yo s/p laparoscopy for ruptured ectopic pregnancy She has been feeling well this morning. Dneis dizziness. Discussed surgery and outcome. She will trial food an rest her until after lunch. If she remains stable she will be discharged home.   Christanna R Schuman 06/28/2019

## 2019-06-28 NOTE — Progress Notes (Signed)
Pt has been up to the restroom x3. Ambulating very well independently. States pain is 1/10. Very pleasant and optimistic despite the situation. States she is still having some shoulder discomfort.   Wanted to talk about everything that happened so I went over the procedure and read through Dr. Gennette Pac note.   She would like to go home today as long as things keep going as well as they are.   Explained she would need Rhogam and suggested she take a shower before going home so that she feels comfortable doing so when she returns home.

## 2019-06-29 LAB — RHOGAM INJECTION: Unit division: 0

## 2019-06-29 LAB — SURGICAL PATHOLOGY

## 2019-07-04 NOTE — Discharge Summary (Signed)
Physician Discharge Summary   Patient ID: Emily Payne [id number] 28 y.o. 03/19/1991  Admit date: 06/27/2019  Discharge date and time: 06/28/2019  4:20 PM   Admitting Physician: Homero Fellers, MD   Discharge Physician: Adrian Prows MD  Admission Diagnoses: Pelvic pain affecting pregnancy [O26.899, R10.2]  Discharge Diagnoses: Ruptured ectopic pregnancy  Admission Condition: serious  Discharged Condition: good  Indication for Admission: Observation after surgery  Hospital Course: Patient was able to ambulate, tolerate a diet and her pain was controlled with oral medication before her discharge home.   Consults: None  Significant Diagnostic Studies: labs and [place]  Treatments: IV hydration and procedures: Laparoscopy  Discharge Exam: BP 130/60 (BP Location: Right Arm)   Pulse 90   Temp 98.3 F (36.8 C) (Oral)   Resp 18   Ht 5\' 9"  (1.753 m)   Wt 97.5 kg   LMP 05/13/2019 Comment: upreg +  SpO2 99%   BMI 31.75 kg/m   General Appearance:    Alert, cooperative, no distress, appears stated age  Head:    Normocephalic, without obvious abnormality, atraumatic  Eyes:    PERRL, conjunctiva/corneas clear, EOM's intact, fundi    benign, both eyes  Ears:    Normal TM's and external ear canals, both ears  Nose:   Nares normal, septum midline, mucosa normal, no drainage    or sinus tenderness  Throat:   Lips, mucosa, and tongue normal; teeth and gums normal  Neck:   Supple, symmetrical, trachea midline, no adenopathy;    thyroid:  no enlargement/tenderness/nodules; no carotid   bruit or JVD  Back:     Symmetric, no curvature, ROM normal, no CVA tenderness  Lungs:     Clear to auscultation bilaterally, respirations unlabored  Chest Wall:    No tenderness or deformity   Heart:    Regular rate and rhythm, S1 and S2 normal, no murmur, rub   or gallop  Breast Exam:    No tenderness, masses, or nipple abnormality  Abdomen:     Soft, non-tender, bowel sounds active all  four quadrants,    no masses, no organomegaly  Genitalia:    Normal female without lesion, discharge or tenderness  Rectal:    Normal tone, normal prostate, no masses or tenderness;   guaiac negative stool  Extremities:   Extremities normal, atraumatic, no cyanosis or edema  Pulses:   2+ and symmetric all extremities  Skin:   Skin color, texture, turgor normal, no rashes or lesions  Lymph nodes:   Cervical, supraclavicular, and axillary nodes normal  Neurologic:   CNII-XII intact, normal strength, sensation and reflexes    throughout    Disposition:   Patient Instructions:  Allergies as of 06/28/2019      Reactions   Sulfa Drugs Cross Reactors Anaphylaxis, Itching, Other (See Comments)   Flu like symptoms      Medication List    TAKE these medications   acetaminophen 500 MG tablet Commonly known as: TYLENOL Take 2 tablets (1,000 mg total) by mouth every 6 (six) hours.   amoxicillin-clavulanate 875-125 MG tablet Commonly known as: AUGMENTIN Take 1 tablet by mouth 2 (two) times daily.   Ativan 1 MG tablet Generic drug: LORazepam Take 0.5-1 tablets (0.5-1 mg total) by mouth every 8 (eight) hours as needed for anxiety. **NO controlled substances from [facility] at this time**   escitalopram 10 MG tablet Commonly known as: LEXAPRO Take 1 tablet (10 mg total) by mouth daily.   ibuprofen 800 MG tablet  Commonly known as: ADVIL Take 1 tablet (800 mg total) by mouth every 8 (eight) hours.   ketoconazole 2 % shampoo Commonly known as: NIZORAL Apply 1 application topically 2 (two) times a week. Use this instead of the prescription lotions   loratadine 10 MG tablet Commonly known as: CLARITIN Take 1 tablet (10 mg total) by mouth daily.   magic mouthwash w/lidocaine Soln Take 5 mLs by mouth 3 (three) times daily as needed for mouth pain.   metoCLOPramide 5 MG tablet Commonly known as: REGLAN Take 1 tablet (5 mg total) by mouth 3 (three) times daily before meals.    norethindrone 0.35 MG tablet Commonly known as: MICRONOR Take 1 tablet (0.35 mg total) by mouth daily.   oxyCODONE 5 MG immediate release tablet Commonly known as: Oxy IR/ROXICODONE Take 1-2 tablets (5-10 mg total) by mouth every 4 (four) hours as needed for moderate pain.   prenatal vitamin w/FE, FA 29-1 MG Chew chewable tablet Chew 1 tablet by mouth daily at 12 noon.      Activity: activity as tolerated Diet: regular diet Wound Care: none needed  Follow-up with Dr. Jerene Pitch in 1 week.  Signed: Natale Milch 07/04/2019 10:30 PM

## 2019-07-04 NOTE — Discharge Summary (Signed)
Incomplete         Physician Discharge Summary   Patient ID: Emily Payne [id number] 28 y.o. 1991-04-08  Admit date: 06/27/2019  Discharge date and time: 06/28/2019  4:20 PM   Admitting Physician: Natale Milch, MD   Discharge Physician: Adelene Idler MD  Admission Diagnoses: Pelvic pain affecting pregnancy [O26.899, R10.2]  Discharge Diagnoses: Ruptured ectopic pregnancy  Admission Condition: serious  Discharged Condition: good  Indication for Admission: Observation after surgery  Hospital Course: Patient was able to ambulate, tolerate a diet and her pain was controlled with oral medication before her discharge home.   Consults: None  Significant Diagnostic Studies: labs and [place]  Treatments: IV hydration and procedures: Laparoscopy  Discharge Exam: BP 130/60 (BP Location: Right Arm)   Pulse 90   Temp 98.3 F (36.8 C) (Oral)   Resp 18   Ht 5\' 9"  (1.753 m)   Wt 97.5 kg   LMP 05/13/2019 Comment: upreg +  SpO2 99%   BMI 31.75 kg/m   General Appearance:    Alert, cooperative, no distress, appears stated age  Head:    Normocephalic, without obvious abnormality, atraumatic  Eyes:    PERRL, conjunctiva/corneas clear, EOM's intact, fundi    benign, both eyes  Ears:    Normal TM's and external ear canals, both ears  Nose:   Nares normal, septum midline, mucosa normal, no drainage    or sinus tenderness  Throat:   Lips, mucosa, and tongue normal; teeth and gums normal  Neck:   Supple, symmetrical, trachea midline, no adenopathy;    thyroid:  no enlargement/tenderness/nodules; no carotid   bruit or JVD  Back:     Symmetric, no curvature, ROM normal, no CVA tenderness  Lungs:     Clear to auscultation bilaterally, respirations unlabored  Chest Wall:    No tenderness or deformity   Heart:    Regular rate and rhythm, S1 and S2 normal, no murmur, rub   or gallop  Breast Exam:    No tenderness, masses, or nipple abnormality   Abdomen:     Soft, non-tender, bowel sounds active all four quadrants,    no masses, no organomegaly  Genitalia:    Normal female without lesion, discharge or tenderness  Rectal:    Normal tone, normal prostate, no masses or tenderness;   guaiac negative stool  Extremities:   Extremities normal, atraumatic, no cyanosis or edema  Pulses:   2+ and symmetric all extremities  Skin:   Skin color, texture, turgor normal, no rashes or lesions  Lymph nodes:   Cervical, supraclavicular, and axillary nodes normal  Neurologic:   CNII-XII intact, normal strength, sensation and reflexes    throughout    Disposition:   Patient Instructions:       Allergies as of 06/28/2019      Reactions   Sulfa Drugs Cross Reactors Anaphylaxis, Itching, Other (See Comments)   Flu like symptoms         Medication List    TAKE these medications   acetaminophen 500 MG tablet Commonly known as: TYLENOL Take 2 tablets (1,000 mg total) by mouth every 6 (six) hours.   amoxicillin-clavulanate 875-125 MG tablet Commonly known as: AUGMENTIN Take 1 tablet by mouth 2 (two) times daily.   Ativan 1 MG tablet Generic drug: LORazepam Take 0.5-1 tablets (0.5-1 mg total) by mouth every 8 (eight) hours as needed for anxiety. **NO controlled substances from [facility] at this time**  escitalopram 10 MG tablet Commonly known as: LEXAPRO Take 1 tablet (10 mg total) by mouth daily.   ibuprofen 800 MG tablet Commonly known as: ADVIL Take 1 tablet (800 mg total) by mouth every 8 (eight) hours.   ketoconazole 2 % shampoo Commonly known as: NIZORAL Apply 1 application topically 2 (two) times a week. Use this instead of the prescription lotions   loratadine 10 MG tablet Commonly known as: CLARITIN Take 1 tablet (10 mg total) by mouth daily.   magic mouthwash w/lidocaine Soln Take 5 mLs by mouth 3 (three) times daily as needed for mouth pain.   metoCLOPramide 5 MG tablet Commonly known as: REGLAN  Take 1 tablet (5 mg total) by mouth 3 (three) times daily before meals.   norethindrone 0.35 MG tablet Commonly known as: MICRONOR Take 1 tablet (0.35 mg total) by mouth daily.   oxyCODONE 5 MG immediate release tablet Commonly known as: Oxy IR/ROXICODONE Take 1-2 tablets (5-10 mg total) by mouth every 4 (four) hours as needed for moderate pain.   prenatal vitamin w/FE, FA 29-1 MG Chew chewable tablet Chew 1 tablet by mouth daily at 12 noon.      Activity: activity as tolerated Diet: regular diet Wound Care: none needed  Follow-up with Dr. Gilman Schmidt in 1 week   Signed: Homero Fellers 07/04/2019 10:30 PM

## 2019-07-06 ENCOUNTER — Encounter: Payer: Self-pay | Admitting: Obstetrics and Gynecology

## 2019-07-06 ENCOUNTER — Other Ambulatory Visit: Payer: Self-pay

## 2019-07-06 ENCOUNTER — Ambulatory Visit (INDEPENDENT_AMBULATORY_CARE_PROVIDER_SITE_OTHER): Payer: Medicaid Other | Admitting: Obstetrics and Gynecology

## 2019-07-06 VITALS — BP 130/82 | HR 79 | Ht 70.0 in | Wt 236.0 lb

## 2019-07-06 DIAGNOSIS — O00101 Right tubal pregnancy without intrauterine pregnancy: Secondary | ICD-10-CM

## 2019-07-06 DIAGNOSIS — K661 Hemoperitoneum: Secondary | ICD-10-CM

## 2019-07-06 DIAGNOSIS — Z9889 Other specified postprocedural states: Secondary | ICD-10-CM

## 2019-07-06 NOTE — Patient Instructions (Addendum)
Follow up after first positive pregnancy test, can call and have early lab work.

## 2019-07-06 NOTE — Progress Notes (Signed)
  Postoperative Follow-up Patient presents post op from laparoscopy for ruptured right ectopic pregnancy, 1 week ago.  Subjective: Patient reports marked improvement in her preop symptoms. Eating a regular diet without difficulty. Pain is controlled without any medications.  Activity: normal activities of daily living. Patient reports additional symptom's since surgery of None.  Objective: BP 130/82   Pulse 79   Ht 5\' 10"  (1.778 m)   Wt 236 lb (107 kg)   BMI 33.86 kg/m  Physical Exam Constitutional:      Appearance: She is well-developed.  Genitourinary:     Vagina and uterus normal.     No lesions in the vagina.     No cervical motion tenderness.     No right or left adnexal mass present.  HENT:     Head: Normocephalic and atraumatic.  Neck:     Musculoskeletal: Neck supple.     Thyroid: No thyromegaly.  Cardiovascular:     Rate and Rhythm: Normal rate and regular rhythm.     Heart sounds: Normal heart sounds.  Pulmonary:     Effort: Pulmonary effort is normal.     Breath sounds: Normal breath sounds.  Chest:     Breasts:        Right: No inverted nipple, mass, nipple discharge or skin change.        Left: No inverted nipple, mass, nipple discharge or skin change.  Abdominal:     General: Abdomen is flat. Bowel sounds are normal. There is no distension.     Palpations: Abdomen is soft. There is no mass.     Comments: Incisions clean, dry and intact  Neurological:     Mental Status: She is alert and oriented to person, place, and time.  Skin:    General: Skin is warm and dry.  Psychiatric:        Behavior: Behavior normal.        Thought Content: Thought content normal.        Judgment: Judgment normal.  Vitals signs reviewed.    Assessment: s/p :  laparoscopy stable  Plan: Patient has done well after surgery with no apparent complications.  I have discussed the post-operative course to date, and the expected progress moving forward.  The patient understands  what complications to be concerned about.  I will see the patient in routine follow up, or sooner if needed.    Activity plan: No restriction.  Pelvic rest.   R  07/06/2019, 1:58 PM

## 2019-07-07 LAB — BETA HCG QUANT (REF LAB): hCG Quant: 196 m[IU]/mL

## 2019-07-09 NOTE — Progress Notes (Signed)
Released to mychart with note

## 2019-07-12 ENCOUNTER — Encounter: Payer: Self-pay | Admitting: Obstetrics and Gynecology

## 2019-09-04 ENCOUNTER — Encounter: Payer: Self-pay | Admitting: Family Medicine

## 2019-09-04 NOTE — Progress Notes (Signed)
Name: Emily Payne   MRN: [id number]    DOB: 1991-08-27   Date:09/05/2019       Progress Note  Subjective:    Chief Complaint  Chief Complaint  Patient presents with  . Ankle Pain    Right ankle pain due to a fall in Sept. 2020.  Fell down stairs  . Medication Refill    I connected with  [name]  on 09/05/19 at  8:00 AM EST by a video enabled telemedicine application and verified that I am speaking with the correct person using two identifiers.  I discussed the limitations of evaluation and management by telemedicine and the availability of in person appointments. The patient expressed understanding and agreed to proceed. Staff also discussed with the patient that there may be a patient responsible charge related to this service. Patient Location: home Provider Location: home office Additional Individuals present: none  HPI A few months ago she fell down the stairs on her porch, has about 6 stairs, she stepped down on her right foot and twisted her ankle and fell down the stairs and "kinda of landed on her right side"  .  She was able to immediately afterwards bear weight but it was painful and she had to limp.  She did stay off of her leg as much as she would the first several days.  Pain has continued since that time, swelling and mobility did gradually get better.  Still having pain daily though, today rated 7/10.  She seeks evaluation now because of a bump to her outside right ankle and foot that she thinks "needs professional evaluation"  She has pain with wearing shoes, pain and swelling are exacerbated by more activity throughout the day. Past right ankle sprain about 6 years ago she went to ER and then followed up with ortho, had a boot, was told it was just a sprain. She denies any foot or ankle weakness, redness, numbness.  No right LE edema.  GAD: Pt has hx of anxiety and panic attacks, maintained on Lexapro.  She is dealing with depression sx and anxiety sx that have  gradually worsened this year, she is having more panic attacks. She moved to a new house in March this year, then soon afterwards lost her job.  Shes struggled to pay the mortgage and feed her kids, she has been looking for a job but is still unemployed.  She also had a ruptured ectopic In Oct, only 2 months ago, with emergent surgery, which was devastating for her, she still wants more kids.  2 weeks after her surgery her grandmother died of COVID.  She is having trouble sleeping, she worries all the time, not about anything in particular, but also worries about normal things, her children, finances, ability to have more kids, how to have christmas etc.  She is having trouble sleeping.  She is feeling down, but denies SI, HI.  She is having panic attacks.     Depression screen [facility] 2/9 09/05/2019 09/27/2018 09/22/2018  Decreased Interest 1 0 0  Down, Depressed, Hopeless 1 0 0  PHQ - 2 Score 2 0 0  Altered sleeping 0 0 2  Tired, decreased energy 0 1 2  Change in appetite 0 1 0  Feeling bad or failure about yourself  0 0 1  Trouble concentrating 0 0 0  Moving slowly or fidgety/restless 0 0 0  Suicidal thoughts 0 0 0  PHQ-9 Score 2 2 5   Difficult doing work/chores Not difficult  at all Not difficult at all -      Patient Active Problem List   Diagnosis Date Noted  . S/P laparoscopic surgery 06/28/2019  . Generalized anxiety disorder with panic attacks 08/31/2018  . Depression 02/21/2015  . Dandruff 06/22/2013  . Obesity 07/04/2012    Social History   Tobacco Use  . Smoking status: Never Smoker  . Smokeless tobacco: Never Used  Substance Use Topics  . Alcohol use: Yes    Alcohol/week: 0.0 standard drinks    Comment: rare but none with pregnancy     Current Outpatient Medications:  .  escitalopram (LEXAPRO) 10 MG tablet, Take 1 tablet (10 mg total) by mouth daily., Disp: 30 tablet, Rfl: 3 .  ketoconazole (NIZORAL) 2 % shampoo, Apply 1 application topically 2 (two) times a week. Use  this instead of the prescription lotions, Disp: 120 mL, Rfl: 2 .  acetaminophen (TYLENOL) 500 MG tablet, Take 2 tablets (1,000 mg total) by mouth every 6 (six) hours. (Patient not taking: Reported on 09/05/2019), Disp: 100 tablet, Rfl: 1 .  ibuprofen (ADVIL) 800 MG tablet, Take 1 tablet (800 mg total) by mouth every 8 (eight) hours. (Patient not taking: Reported on 09/05/2019), Disp: 100 tablet, Rfl: 1 .  LORazepam (ATIVAN) 1 MG tablet, Take 0.5-1 tablets (0.5-1 mg total) by mouth every 8 (eight) hours as needed for anxiety. **NO controlled substances from [facility] at this time**, Disp: , Rfl:   Allergies  Allergen Reactions  . Sulfa Drugs Cross Reactors Anaphylaxis, Itching and Other (See Comments)    Flu like symptoms    I personally reviewed active problem list, medication list, allergies, family history, social history, health maintenance, notes from last several encounters, lab results with the patient/caregiver today.  Review of Systems  Constitutional: Negative.   HENT: Negative.   Eyes: Negative.   Respiratory: Negative.   Cardiovascular: Negative.   Gastrointestinal: Negative.   Endocrine: Negative.   Genitourinary: Negative.   Musculoskeletal: Negative.   Skin: Negative.   Allergic/Immunologic: Negative.   Neurological: Negative.   Hematological: Negative.   Psychiatric/Behavioral: Negative.   All other systems reviewed and are negative.    Objective:   Virtual encounter, vitals limited, only able to obtain the following Today's Vitals   09/05/19 0730  PainSc: 7    There is no height or weight on file to calculate BMI. Nursing Note and Vital Signs reviewed.  Physical Exam Vitals and nursing note reviewed.  Constitutional:      General: She is not in acute distress.    Appearance: She is well-developed. She is not ill-appearing, toxic-appearing or diaphoretic.  HENT:     Head: Normocephalic and atraumatic.     Nose: Nose normal.  Eyes:     General:         Right eye: No discharge.        Left eye: No discharge.     Conjunctiva/sclera: Conjunctivae normal.  Neck:     Trachea: No tracheal deviation.  Pulmonary:     Effort: Pulmonary effort is normal. No respiratory distress.     Breath sounds: No stridor.  Musculoskeletal:        General: Normal range of motion.  Skin:    General: Skin is warm and dry.     Findings: No rash.  Neurological:     Mental Status: She is alert.     Motor: No abnormal muscle tone.     Coordination: Coordination normal.  Psychiatric:  Attention and Perception: Attention normal.        Mood and Affect: Mood normal. Affect is tearful.        Behavior: Behavior normal. Behavior is cooperative.        Thought Content: Thought content does not include homicidal or suicidal ideation. Thought content does not include homicidal or suicidal plan.        Cognition and Memory: Cognition normal.        Judgment: Judgment normal.    PE limited by telephone encounter  No results found for this or any previous visit (from the past 72 hour(s)).  Assessment and Plan:     ICD-10-CM   1. Right ankle pain, unspecified chronicity  M25.571 DG Ankle Complete Right    Ambulatory referral to Orthopedics   X-ray to screen for bony abnormality, with pain for 3 months will refer to foot and ankle specialist, for now continued RICE tx  2. Right ankle swelling  M25.471 DG Ankle Complete Right    Ambulatory referral to Orthopedics  3. Generalized anxiety disorder with panic attacks  F41.1 LORazepam (ATIVAN) 0.5 MG tablet   F41.0 Ambulatory referral to Psychiatry   worsening - uncontrolled with lexapro 10 mg - see below  4. Stress reaction  F43.0 LORazepam (ATIVAN) 0.5 MG tablet    Ambulatory referral to Psychiatry  5. Grief reaction  F43.21 LORazepam (ATIVAN) 0.5 MG tablet    Ambulatory referral to Psychiatry   I did confirm that pt is not currently pregnant, she is on birth control.  We discussed risks and SE profile of  ativan, with her recent ectopic and emergency surgery, loss of pregnancy, loss tube, death of her grandma, loss of job in addition to recently moving and buying a house, her anxiety and depression sx are worse.  She is experiencing more panic attacks and I feel that short term use of low dose ativan PRN will be helpful for her through the holidays and while increasing lexapro dose and getting her set up with psychiatry and counseling. She verbalized understanding of cautions with ativan.  Encouraged her to reach out if she is having any worsening depression or anxiety.  We will do a f/up visit in a few weeks to reassess.    Ankle - injury 3 months ago - lateral ankle pain and now lateral ankle and foot pain with past injury 6 years ago.  Tendon/ligament injury? Scaring/arthritis?  With continued pain and swelling  3 months after injury getting xray and will send to specialist.        -Red flags and when to present for emergency care or RTC including fever >101.11F, chest pain, shortness of breath, new/worsening/un-resolving symptoms,  reviewed with patient at time of visit. Follow up and care instructions discussed and provided in AVS. - I discussed the assessment and treatment plan with the patient. The patient was provided an opportunity to ask questions and all were answered. The patient agreed with the plan and demonstrated an understanding of the instructions.  I provided 22 minutes of non-face-to-face time during this encounter.  Danelle BerryLeisa Xochil Shanker, PA-C 09/05/19 8:08 AM

## 2019-09-05 ENCOUNTER — Encounter: Payer: Self-pay | Admitting: Family Medicine

## 2019-09-05 ENCOUNTER — Ambulatory Visit (INDEPENDENT_AMBULATORY_CARE_PROVIDER_SITE_OTHER): Payer: Medicaid Other | Admitting: Family Medicine

## 2019-09-05 ENCOUNTER — Other Ambulatory Visit: Payer: Self-pay

## 2019-09-05 DIAGNOSIS — M25471 Effusion, right ankle: Secondary | ICD-10-CM

## 2019-09-05 DIAGNOSIS — M25571 Pain in right ankle and joints of right foot: Secondary | ICD-10-CM | POA: Diagnosis not present

## 2019-09-05 DIAGNOSIS — F410 Panic disorder [episodic paroxysmal anxiety] without agoraphobia: Secondary | ICD-10-CM

## 2019-09-05 DIAGNOSIS — F4321 Adjustment disorder with depressed mood: Secondary | ICD-10-CM

## 2019-09-05 DIAGNOSIS — F430 Acute stress reaction: Secondary | ICD-10-CM

## 2019-09-05 DIAGNOSIS — F411 Generalized anxiety disorder: Secondary | ICD-10-CM

## 2019-09-05 MED ORDER — LORAZEPAM 0.5 MG PO TABS: 0.5000 mg | ORAL_TABLET | Freq: Every day | ORAL | 0 refills | Status: DC | PRN

## 2019-09-05 MED ORDER — ESCITALOPRAM OXALATE 20 MG PO TABS: 20.0000 mg | ORAL_TABLET | Freq: Every day | ORAL | 2 refills | Status: DC

## 2019-09-11 ENCOUNTER — Encounter: Payer: Self-pay | Admitting: Family Medicine

## 2019-09-21 HISTORY — DX: Maternal care for unspecified type scar from previous cesarean delivery: O34.219

## 2019-09-21 NOTE — L&D Delivery Note (Addendum)
Date of delivery: 06/09/2020 Estimated Date of Delivery: 06/07/20 Patient's last menstrual period was 09/01/2019 (exact date). EGA: [redacted]w[redacted]d  Delivery Note At 3:55 PM a viable female was delivered via Vaginal, Spontaneous (Presentation: Left Occiput Anterior).  APGAR: 6, 7; weight 8 lb 6.4 oz (3810 g).   Placenta status: Spontaneous, Intact.  Cord: 3 vessels with the following complications: None.  Cord pH: NA  Called to see patient.  Mom pushed well to deliver a vigorous viable female infant.  The head followed by shoulders, which delivered without difficulty, and the rest of the body.  Nuchal cord noted and reduced on the perineum.  Baby to mom's chest.  Cord clamped and cut after 3 min delay.  Cord blood obtained.  Placenta delivered spontaneously, intact, with a 3-vessel cord.   All counts correct. Brisk bleeding immediately after delivery. Cytotec placed per rectum, hemabate and IM methergine given. Dr Jerene Pitch called to assist. Please see her note regarding repair of lacerations. Hemostasis obtained with previously mentioned medications, IV pitocin and fundal massage.   Anesthesia: Epidural Episiotomy: None Lacerations: 2nd degree Perineal;Periurethral;Cervical Suture Repair: see progress note from Dr Jerene Pitch Est. Blood Loss (mL):  1695  Mom to postpartum.  Baby to Couplet care / Skin to Skin.  [name], CNM 06/09/2020, 5:59 PM

## 2019-09-26 ENCOUNTER — Ambulatory Visit
Admission: RE | Admit: 2019-09-26 | Discharge: 2019-09-26 | Disposition: A | Discharge status: Home / Self Care | Payer: Medicaid Other | Source: Ambulatory Visit | Attending: Family Medicine | Admitting: Family Medicine

## 2019-09-26 ENCOUNTER — Other Ambulatory Visit: Payer: Self-pay

## 2019-09-26 ENCOUNTER — Ambulatory Visit
Admission: RE | Admit: 2019-09-26 | Discharge: 2019-09-26 | Disposition: A | Discharge status: Home / Self Care | Payer: Medicaid Other | Attending: Family Medicine | Admitting: Family Medicine

## 2019-09-26 DIAGNOSIS — M7989 Other specified soft tissue disorders: Secondary | ICD-10-CM | POA: Diagnosis not present

## 2019-09-26 DIAGNOSIS — M25571 Pain in right ankle and joints of right foot: Secondary | ICD-10-CM | POA: Diagnosis not present

## 2019-09-26 DIAGNOSIS — M25471 Effusion, right ankle: Secondary | ICD-10-CM | POA: Insufficient documentation

## 2019-09-27 ENCOUNTER — Telehealth: Payer: Self-pay

## 2019-09-27 DIAGNOSIS — M25471 Effusion, right ankle: Secondary | ICD-10-CM

## 2019-09-27 DIAGNOSIS — M25571 Pain in right ankle and joints of right foot: Secondary | ICD-10-CM

## 2019-09-27 NOTE — Telephone Encounter (Signed)
-----   Message from Danelle Berry, [place] sent at 09/26/2019  5:08 PM EST ----- Radiology report reviewed, images personally reviewed.  Agree with radiology report.  Pt will be called with negative imaging results.  X-ray looks good it does not show any current fracture or joint abnormality and it does not show any old broken bones or remodeling  If she is still having swelling or pain we can send her to the specialist for further evaluation, please advise patient preference

## 2019-10-05 ENCOUNTER — Ambulatory Visit (INDEPENDENT_AMBULATORY_CARE_PROVIDER_SITE_OTHER): Payer: Medicaid Other | Admitting: Obstetrics and Gynecology

## 2019-10-05 ENCOUNTER — Other Ambulatory Visit: Payer: Self-pay

## 2019-10-05 ENCOUNTER — Encounter: Payer: Self-pay | Admitting: Obstetrics and Gynecology

## 2019-10-05 ENCOUNTER — Ambulatory Visit (INDEPENDENT_AMBULATORY_CARE_PROVIDER_SITE_OTHER): Payer: Medicaid Other

## 2019-10-05 VITALS — BP 138/80 | Temp 97.2°F | Ht 70.0 in | Wt 258.0 lb

## 2019-10-05 DIAGNOSIS — O3481 Maternal care for other abnormalities of pelvic organs, first trimester: Secondary | ICD-10-CM

## 2019-10-05 DIAGNOSIS — Z3490 Encounter for supervision of normal pregnancy, unspecified, unspecified trimester: Secondary | ICD-10-CM | POA: Diagnosis not present

## 2019-10-05 DIAGNOSIS — N8312 Corpus luteum cyst of left ovary: Secondary | ICD-10-CM

## 2019-10-05 DIAGNOSIS — Z3A00 Weeks of gestation of pregnancy not specified: Secondary | ICD-10-CM | POA: Diagnosis not present

## 2019-10-05 DIAGNOSIS — Z8759 Personal history of other complications of pregnancy, childbirth and the puerperium: Secondary | ICD-10-CM | POA: Diagnosis not present

## 2019-10-05 NOTE — Progress Notes (Signed)
Patient ID: Emily Payne, female   DOB: 1990/11/21, 29 y.o.   MRN: [id number]  Reason for Consult: Possible Pregnancy (Positive pregnancy test on 10/03/19, some nausea, and LQ feels full)   Referred by Danelle Berry, PA-C  Subjective:     HPI:  Emily Payne is a 29 y.o. female. She recently missed a period and had a positive home pregnancy test. She has a recent history of a ruptured right ectopic pregnancy treated with right salpingectomy.  She is feeling well. Mild pelvic discomfort. No dizziness, no fainting, no vaginal bleeding.  LMP 09/01/2019.   OB History  Gravida Para Term Preterm AB Living  2 1 1  0 1 1  SAB TAB Ectopic Multiple Live Births  0 0 1 0 1    # Outcome Date GA Lbr Len/2nd Weight Sex Delivery Anes PTL Lv  2 Term 10/01/14 [redacted]w[redacted]d  6 lb 13.4 oz (3.1 kg) M CS-LTranv Spinal  LIV  1 Ectopic              Past Medical History:  Diagnosis Date  . Anxiety   . Bipolar disorder (HCC)    no med currently  . Depression   . Hypoglycemic syndrome   . 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 Migraines    otc med prn. with aura   . Poor fetal growth affecting management of mother in third trimester    Family History  Problem Relation Age of Onset  . Ovarian cysts Mother   . Stroke Maternal Grandmother   . Cancer Maternal Grandmother 27       uterine cancer  . Lung cancer Paternal Grandfather   . Diabetes Maternal Great-grandmother   . Lung cancer Maternal Great-grandmother    Past Surgical History:  Procedure Laterality Date  . abscess tooth    . CESAREAN SECTION N/A 10/01/2014   Procedure: PRIMARY CESAREAN SECTION;  Surgeon: 11/30/2014, MD;  Location: [facility];  Service: Obstetrics;  Laterality: N/A;  . DIAGNOSTIC LAPAROSCOPY WITH REMOVAL OF ECTOPIC PREGNANCY N/A 06/27/2019   Procedure: DIAGNOSTIC LAPAROSCOPY , evacuation or hemoperitoneum;  Surgeon: 08/27/2019, MD;  Location: [facility] OR[facility];  Service: Gynecology;  Laterality: N/A;  . LAPAROSCOPIC UNILATERAL  SALPINGECTOMY Right 06/27/2019   Procedure: LAPAROSCOPIC UNILATERAL SALPINGECTOMY;  Surgeon: 08/27/2019, MD;  Location: [facility];  Service: Gynecology;  Laterality: Right;  . TONSILLECTOMY    . WISDOM TOOTH EXTRACTION      Short Social History:  Social History   Tobacco Use  . Smoking status: Never Smoker  . Smokeless tobacco: Never Used  Substance Use Topics  . Alcohol use: Yes    Alcohol/week: 0.0 standard drinks    Comment: rare but none with pregnancy    Allergies  Allergen Reactions  . Sulfa Drugs Cross Reactors Anaphylaxis, Itching and Other (See Comments)    Flu like symptoms    Current Outpatient Medications  Medication Sig Dispense Refill  . escitalopram (LEXAPRO) 20 MG tablet Take 1 tablet (20 mg total) by mouth daily. 30 tablet 2  . ketoconazole (NIZORAL) 2 % shampoo Apply 1 application topically 2 (two) times a week. Use this instead of the prescription lotions 120 mL 2  . LORazepam (ATIVAN) 0.5 MG tablet Take 1-2 tablets (0.5-1 mg total) by mouth daily as needed for anxiety (for severe anxiety symptoms or panic attacks). 20 tablet 0  . acetaminophen (TYLENOL) 500 MG tablet Take 2 tablets (1,000 mg total) by mouth every 6 (six) hours. (Patient not taking:  Reported on 09/05/2019) 100 tablet 1   No current facility-administered medications for this visit.    Review of Systems  Constitutional: Negative for chills, fatigue, fever and unexpected weight change.  HENT: Negative for trouble swallowing.  Eyes: Negative for loss of vision.  Respiratory: Negative for cough, shortness of breath and wheezing.  Cardiovascular: Negative for chest pain, leg swelling, palpitations and syncope.  GI: Negative for abdominal pain, blood in stool, diarrhea, nausea and vomiting.  GU: Negative for difficulty urinating, dysuria, frequency and hematuria.  Musculoskeletal: Negative for back pain, leg pain and joint pain.  Skin: Negative for rash.  Neurological: Negative for  dizziness, headaches, light-headedness, numbness and seizures.  Psychiatric: Negative for behavioral problem, confusion, depressed mood and sleep disturbance.        Objective:  Objective   Vitals:   10/05/19 0836  BP: 138/80  Temp: (!) 97.2 F (36.2 C)  Weight: 258 lb (117 kg)  Height: 5\' 10"  (1.778 m)   Body mass index is 37.02 kg/m.  Physical Exam Vitals and nursing note reviewed.  Constitutional:      Appearance: She is well-developed.  HENT:     Head: Normocephalic and atraumatic.  Cardiovascular:     Rate and Rhythm: Normal rate and regular rhythm.  Pulmonary:     Effort: Pulmonary effort is normal.     Breath sounds: Normal breath sounds.  Abdominal:     General: Bowel sounds are normal.     Palpations: Abdomen is soft.  Musculoskeletal:        General: Normal range of motion.  Skin:    General: Skin is warm and dry.  Neurological:     Mental Status: She is alert and oriented to person, place, and time.  Psychiatric:        Behavior: Behavior normal.        Thought Content: Thought content normal.        Judgment: Judgment normal.         Assessment/Plan:     29 yo G3P1011 at [redacted] wks GA by LMP with history of ectopic pregnancy, following up early this pregnancy for serial beta hcg and early [place].  Will plan for beta hcg Monday, Wednesday and Friday of next week.  Weekly pelvic US. Today's did not show evidence of ectopic or IUP.  Follow up next Friday with MD in office.  Discussed warnings of ectopic pregnancy and precautions including pelvic rest and not traveling to remote destinations.    More than 15 minutes were spent face to face with the patient in the room with more than 50% of the time spent providing counseling and discussing the plan of management.     Adrian Prows MD [facility] 10/08/2019 7:31 AM

## 2019-10-06 LAB — BETA HCG QUANT (REF LAB): hCG Quant: 506 m[IU]/mL

## 2019-10-08 ENCOUNTER — Other Ambulatory Visit: Payer: Medicaid Other

## 2019-10-08 ENCOUNTER — Other Ambulatory Visit: Payer: Self-pay

## 2019-10-08 DIAGNOSIS — Z3490 Encounter for supervision of normal pregnancy, unspecified, unspecified trimester: Secondary | ICD-10-CM

## 2019-10-09 LAB — BETA HCG QUANT (REF LAB): hCG Quant: 1381 m[IU]/mL

## 2019-10-10 ENCOUNTER — Other Ambulatory Visit: Payer: Medicaid Other

## 2019-10-10 ENCOUNTER — Other Ambulatory Visit: Payer: Self-pay

## 2019-10-10 DIAGNOSIS — Z3490 Encounter for supervision of normal pregnancy, unspecified, unspecified trimester: Secondary | ICD-10-CM | POA: Diagnosis not present

## 2019-10-11 LAB — BETA HCG QUANT (REF LAB): hCG Quant: 3378 m[IU]/mL

## 2019-10-12 ENCOUNTER — Ambulatory Visit (INDEPENDENT_AMBULATORY_CARE_PROVIDER_SITE_OTHER): Payer: Medicaid Other | Admitting: Obstetrics and Gynecology

## 2019-10-12 ENCOUNTER — Other Ambulatory Visit: Payer: Self-pay | Admitting: Obstetrics and Gynecology

## 2019-10-12 ENCOUNTER — Other Ambulatory Visit: Payer: Self-pay

## 2019-10-12 ENCOUNTER — Encounter: Payer: Self-pay | Admitting: Obstetrics and Gynecology

## 2019-10-12 ENCOUNTER — Ambulatory Visit (INDEPENDENT_AMBULATORY_CARE_PROVIDER_SITE_OTHER): Payer: Medicaid Other

## 2019-10-12 VITALS — BP 124/78 | Ht 70.0 in | Wt 259.0 lb

## 2019-10-12 DIAGNOSIS — Z8759 Personal history of other complications of pregnancy, childbirth and the puerperium: Secondary | ICD-10-CM

## 2019-10-12 DIAGNOSIS — O09291 Supervision of pregnancy with other poor reproductive or obstetric history, first trimester: Secondary | ICD-10-CM

## 2019-10-12 DIAGNOSIS — Z3A00 Weeks of gestation of pregnancy not specified: Secondary | ICD-10-CM

## 2019-10-12 DIAGNOSIS — Z3490 Encounter for supervision of normal pregnancy, unspecified, unspecified trimester: Secondary | ICD-10-CM

## 2019-10-12 NOTE — Progress Notes (Signed)
Gynecology Ultrasound Follow Up   Chief Complaint  Patient presents with  . Follow-up  . Viability and location of pregnancy, history of ectopic pregnancy     History of Present Illness: Patient is a 29 y.o. female who presents today for ultrasound evaluation of the above .  Ultrasound demonstrates the following findings Adnexa: no masses seen (left adnexa not visualized).   Uterus: gestational sac seen within the uterus with a yolk sac.  No evidence of ectopic pregnancy.   No fetal pole noted.  Patient notes no pain or vaginal bleeding.  Patient has a history of ruptured right ectopic pregnancy in 06/2019, s/p right salpingectomy.   Past Medical History:  Diagnosis Date  . Anxiety   . Bipolar disorder ([place])    no med currently  . Depression   . Hypoglycemic syndrome   . Illicit drug use 30/16/0109  . Migraines    otc med prn. with aura   . Poor fetal growth affecting management of mother in third trimester     Past Surgical History:  Procedure Laterality Date  . abscess tooth    . CESAREAN SECTION N/A 10/01/2014   Procedure: PRIMARY CESAREAN SECTION;  Surgeon: Woodroe Mode, MD;  Location: [facility];  Service: Obstetrics;  Laterality: N/A;  . DIAGNOSTIC LAPAROSCOPY WITH REMOVAL OF ECTOPIC PREGNANCY N/A 06/27/2019   Procedure: DIAGNOSTIC LAPAROSCOPY , evacuation or hemoperitoneum;  Surgeon: Homero Fellers, MD;  Location: [facility];  Service: Gynecology;  Laterality: N/A;  . LAPAROSCOPIC UNILATERAL SALPINGECTOMY Right 06/27/2019   Procedure: LAPAROSCOPIC UNILATERAL SALPINGECTOMY;  Surgeon: Homero Fellers, MD;  Location: [facility];  Service: Gynecology;  Laterality: Right;  . TONSILLECTOMY    . WISDOM TOOTH EXTRACTION       Family History  Problem Relation Age of Onset  . Ovarian cysts Mother   . Stroke Maternal Grandmother   . Cancer Maternal Grandmother 27       uterine cancer  . Lung cancer Paternal Grandfather   . Diabetes Maternal Great-grandmother     . Lung cancer Maternal Great-grandmother     Social History   Socioeconomic History  . Marital status: Married    Spouse name: Remo Lipps  . Number of children: 3  . Years of education: 61  . Highest education level: Bachelor's degree (e.g., BA, AB, BS)  Occupational History  . Not on file  Tobacco Use  . Smoking status: Never Smoker  . Smokeless tobacco: Never Used  Substance and Sexual Activity  . Alcohol use: Yes    Alcohol/week: 0.0 standard drinks    Comment: rare but none with pregnancy  . Drug use: No  . Sexual activity: Yes    Partners: Male    Birth control/protection: None  Other Topics Concern  . Not on file  Social History Narrative   G0- engaged.   Works at [employer] but also attends [employer] for psychology.   Has two step children- ages 36 and 17 that permanently stays with her. Patient also has a biological 5 yr old son.   Social Determinants of Health   Financial Resource Strain:   . Difficulty of Paying Living Expenses: Not on file  Food Insecurity:   . Worried About Charity fundraiser in the Last Year: Not on file  . Ran Out of Food in the Last Year: Not on file  Transportation Needs:   . Lack of Transportation (Medical): Not on file  . Lack of Transportation (Non-Medical): Not on file  Physical  Activity:   . Days of Exercise per Week: Not on file  . Minutes of Exercise per Session: Not on file  Stress:   . Feeling of Stress : Not on file  Social Connections:   . Frequency of Communication with Friends and Family: Not on file  . Frequency of Social Gatherings with Friends and Family: Not on file  . Attends Religious Services: Not on file  . Active Member of Clubs or Organizations: Not on file  . Attends Banker Meetings: Not on file  . Marital Status: Not on file  Intimate Partner Violence:   . Fear of Current or Ex-Partner: Not on file  . Emotionally Abused: Not on file  . Physically Abused: Not on file  . Sexually Abused: Not on  file    Allergies  Allergen Reactions  . Sulfa Drugs Cross Reactors Anaphylaxis, Itching and Other (See Comments)    Flu like symptoms    Prior to Admission medications   Medication Sig Start Date End Date Taking? Authorizing Provider  acetaminophen (TYLENOL) 500 MG tablet Take 2 tablets (1,000 mg total) by mouth every 6 (six) hours. Patient not taking: Reported on 09/05/2019 06/28/19   Natale Milch, MD  escitalopram (LEXAPRO) 20 MG tablet Take 1 tablet (20 mg total) by mouth daily. 09/05/19   Danelle Berry, PA-C  ketoconazole (NIZORAL) 2 % shampoo Apply 1 application topically 2 (two) times a week. Use this instead of the prescription lotions 09/25/18   Lada, Janit Bern, MD  LORazepam (ATIVAN) 0.5 MG tablet Take 1-2 tablets (0.5-1 mg total) by mouth daily as needed for anxiety (for severe anxiety symptoms or panic attacks). 09/05/19   Danelle Berry, PA-C    Physical Exam BP 124/78   Ht 5\' 10"  (1.778 m)   Wt 259 lb (117.5 kg)   LMP 09/01/2019   BMI 37.16 kg/m    General: NAD HEENT: normocephalic, anicteric Pulmonary: No increased work of breathing Extremities: no edema, erythema, or tenderness Neurologic: Grossly intact, normal gait Psychiatric: mood appropriate, affect full  Imaging Results 14/08/2019 OB Transvaginal  Result Date: 10/12/2019 Patient Name: Emily Payne DOB: [birth year] MRN: [birth year] ULTRASOUND REPORT Location: [facility] Date of Service: 10/12/2019 Indications:dating/viability, history of ectopic pregnancy Findings: There is a single gestational sac seen within the uterus. No fetal pole or heartbeat is seen at this time. The GS measures 10.7 mm. Yolk sac is visualized and appears normal. Amnion: not visualized Right Ovary is seen with limited views. Left Ovary is not visualized. Corpus luteal cyst:  is not visualized Survey of the adnexa demonstrates no adnexal masses. There is no free peritoneal fluid in the cul de sac. Impression: 1. There is a single gestational  sac and yolk sac seen within the uterus. No fetal pole or hearbeat is seen at this time. 2. (U/S) EDD is consistent with Clinically established EDD of 06/07/2020. 3. Repeat ultrasound in 11 or greater days to verify viability and dating. 06/09/2020, RT The ultrasound images and findings were reviewed by me and I agree with the above report. Deanna Artis, MD, [facility], [facility] 10/12/2019 2:22 PM        Assessment: 29 y.o. G2P1011  1. History of ectopic pregnancy      Plan: Problem List Items Addressed This Visit    None    Visit Diagnoses    History of ectopic pregnancy    -  Primary   Relevant Orders   26 OB  Comp Less 14 Wks     Ultrasound reassuring today.  Will follow up with ultrasound in 11+ days for viability and dating with a NOB visit.    10 minutes spent in face to face discussion with > 50% spent in counseling,management, and coordination of care of her history of ectopic pregnancy and current pregnancy.   Thomasene Mohair, MD, [facility], [facility] 10/12/2019 2:35 PM

## 2019-10-24 ENCOUNTER — Other Ambulatory Visit: Payer: Self-pay

## 2019-10-24 ENCOUNTER — Ambulatory Visit (INDEPENDENT_AMBULATORY_CARE_PROVIDER_SITE_OTHER): Payer: Medicaid Other

## 2019-10-24 ENCOUNTER — Other Ambulatory Visit (HOSPITAL_COMMUNITY)
Admission: RE | Admit: 2019-10-24 | Discharge: 2019-10-24 | Disposition: A | Discharge status: Home / Self Care | Payer: Medicaid Other | Source: Ambulatory Visit | Attending: Obstetrics & Gynecology | Admitting: Obstetrics & Gynecology

## 2019-10-24 ENCOUNTER — Ambulatory Visit (INDEPENDENT_AMBULATORY_CARE_PROVIDER_SITE_OTHER): Payer: Medicaid Other | Admitting: Obstetrics and Gynecology

## 2019-10-24 ENCOUNTER — Encounter: Payer: Self-pay | Admitting: Obstetrics and Gynecology

## 2019-10-24 VITALS — BP 136/79 | Wt 258.0 lb

## 2019-10-24 DIAGNOSIS — Z6791 Unspecified blood type, Rh negative: Secondary | ICD-10-CM

## 2019-10-24 DIAGNOSIS — Z3A01 Less than 8 weeks gestation of pregnancy: Secondary | ICD-10-CM

## 2019-10-24 DIAGNOSIS — Z6837 Body mass index (BMI) 37.0-37.9, adult: Secondary | ICD-10-CM

## 2019-10-24 DIAGNOSIS — O3680X0 Pregnancy with inconclusive fetal viability, not applicable or unspecified: Secondary | ICD-10-CM | POA: Diagnosis not present

## 2019-10-24 DIAGNOSIS — Z8759 Personal history of other complications of pregnancy, childbirth and the puerperium: Secondary | ICD-10-CM

## 2019-10-24 DIAGNOSIS — O0991 Supervision of high risk pregnancy, unspecified, first trimester: Secondary | ICD-10-CM | POA: Diagnosis not present

## 2019-10-24 DIAGNOSIS — O34219 Maternal care for unspecified type scar from previous cesarean delivery: Secondary | ICD-10-CM

## 2019-10-24 DIAGNOSIS — O26899 Other specified pregnancy related conditions, unspecified trimester: Secondary | ICD-10-CM

## 2019-10-24 DIAGNOSIS — O99211 Obesity complicating pregnancy, first trimester: Secondary | ICD-10-CM
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
## 2019-10-26 ENCOUNTER — Other Ambulatory Visit: Payer: Medicaid Other

## 2019-10-26 ENCOUNTER — Encounter: Payer: Medicaid Other | Admitting: Obstetrics & Gynecology

## 2019-10-26 LAB — CERVICOVAGINAL ANCILLARY ONLY
Chlamydia: NEGATIVE
Comment: NEGATIVE
Comment: NORMAL
Neisseria Gonorrhea: NEGATIVE

## 2019-10-26 LAB — URINE CULTURE

## 2019-10-28 LAB — URINE DRUG PANEL 7
Amphetamines, Urine: NEGATIVE ng/mL
Barbiturate Quant, Ur: NEGATIVE ng/mL
Benzodiazepine Quant, Ur: NEGATIVE ng/mL
Cannabinoid Quant, Ur: POSITIVE — AB
Cocaine (Metab.): NEGATIVE ng/mL
Opiate Quant, Ur: NEGATIVE ng/mL
PCP Quant, Ur: NEGATIVE ng/mL

## 2019-11-14 ENCOUNTER — Encounter: Payer: Self-pay | Admitting: Obstetrics and Gynecology

## 2019-11-14 ENCOUNTER — Other Ambulatory Visit: Payer: Medicaid Other

## 2019-11-14 ENCOUNTER — Ambulatory Visit (INDEPENDENT_AMBULATORY_CARE_PROVIDER_SITE_OTHER): Payer: Medicaid Other | Admitting: Obstetrics and Gynecology

## 2019-11-14 ENCOUNTER — Ambulatory Visit (INDEPENDENT_AMBULATORY_CARE_PROVIDER_SITE_OTHER): Payer: Medicaid Other

## 2019-11-14 ENCOUNTER — Other Ambulatory Visit: Payer: Self-pay

## 2019-11-14 VITALS — BP 120/78 | Wt 259.0 lb

## 2019-11-14 DIAGNOSIS — Z1379 Encounter for other screening for genetic and chromosomal anomalies: Secondary | ICD-10-CM

## 2019-11-14 DIAGNOSIS — O99211 Obesity complicating pregnancy, first trimester: Secondary | ICD-10-CM

## 2019-11-14 DIAGNOSIS — O0991 Supervision of high risk pregnancy, unspecified, first trimester: Secondary | ICD-10-CM | POA: Insufficient documentation

## 2019-11-14 DIAGNOSIS — Z3A10 10 weeks gestation of pregnancy: Secondary | ICD-10-CM | POA: Diagnosis not present

## 2019-11-14 DIAGNOSIS — Z6837 Body mass index (BMI) 37.0-37.9, adult: Secondary | ICD-10-CM

## 2019-11-14 DIAGNOSIS — O34219 Maternal care for unspecified type scar from previous cesarean delivery: Secondary | ICD-10-CM

## 2019-11-14 DIAGNOSIS — O210 Mild hyperemesis gravidarum: Secondary | ICD-10-CM

## 2019-11-14 DIAGNOSIS — Z3689 Encounter for other specified antenatal screening: Secondary | ICD-10-CM | POA: Diagnosis not present

## 2019-11-14 HISTORY — DX: Supervision of high risk pregnancy, unspecified, first trimester: O09.91

## 2019-11-14 MED ORDER — DOCUSATE SODIUM 100 MG PO CAPS: 100.0000 mg | ORAL_CAPSULE | Freq: Two times a day (BID) | ORAL | 2 refills | Status: DC | PRN

## 2019-11-14 MED ORDER — ONDANSETRON 4 MG PO TBDP: 4.0000 mg | ORAL_TABLET | Freq: Four times a day (QID) | ORAL | 6 refills | Status: DC | PRN

## 2019-11-14 NOTE — Progress Notes (Signed)
Routine Prenatal Care Visit  Subjective  Emily Payne is a 29 y.o. G3P1011 at [redacted]w[redacted]d being seen today for ongoing prenatal care.  She is currently monitored for the following issues for this low-risk pregnancy and has Obesity; Dandruff; Depression; Generalized anxiety disorder with panic attacks; S/P laparoscopic surgery; and Supervision of high risk pregnancy in first trimester on their problem list.  ----------------------------------------------------------------------------------- Patient reports nausea.  She reports she is having vomiting about twice a day.  She is staying hydrated.  She has not lost more than 5 pounds. Contractions: Not present. Vag. Bleeding: None.  Movement: Absent. Denies leaking of fluid.  ----------------------------------------------------------------------------------- The following portions of the patient's history were reviewed and updated as appropriate: allergies, current medications, past family history, past medical history, past social history, past surgical history and problem list. Problem list updated.   Objective  Blood pressure 120/78, weight 259 lb (117.5 kg), last menstrual period 09/01/2019. Pregravid weight 258 lb (117 kg) Total Weight Gain 1 lb (0.454 kg) Urinalysis:      Fetal Status: Fetal Heart Rate (bpm): 160   Movement: Absent     General:  Alert, oriented and cooperative. Patient is in no acute distress.  Skin: Skin is warm and dry. No rash noted.   Cardiovascular: Normal heart rate noted  Respiratory: Normal respiratory effort, no problems with respiration noted  Abdomen: Soft, gravid, appropriate for gestational age. Pain/Pressure: Absent     Pelvic:  Cervical exam deferred        Extremities: Normal range of motion.  Edema: None  Mental Status: Normal mood and affect. Normal behavior. Normal judgment and thought content.     Assessment   29 y.o. G3P1011 at [redacted]w[redacted]d by  06/07/2020, by Last Menstrual Period presenting for routine  prenatal visit  Plan   Pregnancy#3 Problems (from 09/01/19 to present)    Problem Noted Resolved   Supervision of high risk pregnancy in first trimester 11/14/2019 by Natale Milch, MD No   Overview Addendum 11/14/2019 11:40 AM by Natale Milch, MD     Nursing Staff Provider  Office Location  [facility] Dating    Language  English Anatomy US    Flu Vaccine   Genetic Screen  NIPS:   AFP:   First Screen:    TDaP vaccine    Hgb A1C or  GTT Early : Third trimester :   Rhogam     LAB RESULTS   Feeding Plan  Blood Type --/--/A NEG (10/07 2219)   Contraception  Antibody NEG (10/07 2219)  Circumcision  Rubella    Pediatrician   RPR     Support Person  HBsAg     Prenatal Classes  HIV      Varicella   BTL Consent  GBS  (For PCN allergy, check sensitivities)        VBAC Consent  Pap  06/2017 NIL    Hgb Electro      CF      SMA                   Gestational age appropriate obstetric precautions including but not limited to vaginal bleeding, contractions, leaking of fluid and fetal movement were reviewed in detail with the patient.    NOB labs today Discuss treatment for hyperemesis, rx for zofran and colace sent  Return in about 2 weeks (around 11/28/2019) for ROB in person.  Natale Milch MD [facility], [facility] 11/14/2019, 12:21 PM

## 2019-11-14 NOTE — Progress Notes (Deleted)
259

## 2019-11-14 NOTE — Progress Notes (Signed)
ROB/GTT C/o nausea and vomiting Denies cramping/spotting

## 2019-11-14 NOTE — Patient Instructions (Signed)
Initial steps to help :   B6 (pyridoxine) 25 mg,  3-4 times a day Unisom (doxylamine) 25 mg at bedtime **B6 and Unisom are available as a combination prescription medications called diclegis and bonjesta  B1 (thiamin)  50-100 mg 1-4 a day  Continue prenatal vitamin with iron and thiamin. If it is not tolerated switch to 1 mg of folic acid.  Can add medication for gastric reflux if needed.  Subsequent steps to be added to B1, B6, and Unisom:  1. Antihistamine (one of the following medications) Dramamine      25-50 mg every 4-6 hours Benadryl      25-50 mg every 4-6 hours Meclizine      25 mg every 6 hours  2. Dopamine Antagonist (one of the following medications) Metoclopramide  (Reglan)  5-10 mg every 6-8 hours         PO Promethazine   (Phenergan)   12.5-25 mg every 4-6 hours      PO or rectal Prochlorperazine  (Compazine)  5-10 mg every 6-8 hours     25mg BID rectally    Subsequent steps if there has still not been improvement in symptoms:  3. Daily stool softner  4. Ondansetron  (Zofran)   4-8 mg every 6-8 hours     Hyperemesis Gravidarum Hyperemesis gravidarum is a severe form of nausea and vomiting that happens during pregnancy. Hyperemesis is worse than morning sickness. It may cause you to have nausea or vomiting all day for many days. It may keep you from eating and drinking enough food and liquids, which can lead to dehydration, malnutrition, and weight loss. Hyperemesis usually occurs during the first half (the first 20 weeks) of pregnancy. It often goes away once a woman is in her second half of pregnancy. However, sometimes hyperemesis continues through an entire pregnancy. What are the causes? The cause of this condition is not known. It may be related to changes in chemicals (hormones) in the body during pregnancy, such as the high level of pregnancy hormone (human chorionic gonadotropin) or the increase in the female sex hormone (estrogen). What are the signs or  symptoms? Symptoms of this condition include:  Nausea that does not go away.  Vomiting that does not allow you to keep any food down.  Weight loss.  Body fluid loss (dehydration).  Having no desire to eat, or not liking food that you have previously enjoyed. How is this diagnosed? This condition may be diagnosed based on:  A physical exam.  Your medical history.  Your symptoms.  Blood tests.  Urine tests. How is this treated? This condition is managed by controlling symptoms. This may include:  Following an eating plan. This can help lessen nausea and vomiting.  Taking prescription medicines. An eating plan and medicines are often used together to help control symptoms. If medicines do not help relieve nausea and vomiting, you may need to receive fluids through an IV at the hospital. Follow these instructions at home: Eating and drinking   Avoid the following: ? Drinking fluids with meals. Try not to drink anything during the 30 minutes before and after your meals. ? Drinking more than 1 cup of fluid at a time. ? Eating foods that trigger your symptoms. These may include spicy foods, coffee, high-fat foods, very sweet foods, and acidic foods. ? Skipping meals. Nausea can be more intense on an empty stomach. If you cannot tolerate food, do not force it. Try sucking on ice chips or other frozen   items and make up for missed calories later. ? Lying down within 2 hours after eating. ? Being exposed to environmental triggers. These may include food smells, smoky rooms, closed spaces, rooms with strong smells, warm or humid places, overly loud and noisy rooms, and rooms with motion or flickering lights. Try eating meals in a well-ventilated area that is free of strong smells. ? Quick and sudden changes in your movement. ? Taking iron pills and multivitamins that contain iron. If you take prescription iron pills, do not stop taking them unless your health care provider  approves. ? Preparing food. The smell of food can spoil your appetite or trigger nausea.  To help relieve your symptoms: ? Listen to your body. Everyone is different and has different preferences. Find what works best for you. ? Eat and drink slowly. ? Eat 5-6 small meals daily instead of 3 large meals. Eating small meals and snacks can help you avoid an empty stomach. ? In the morning, before getting out of bed, eat a couple of crackers to avoid moving around on an empty stomach. ? Try eating starchy foods as these are usually tolerated well. Examples include cereal, toast, bread, potatoes, pasta, rice, and pretzels. ? Include at least 1 serving of protein with your meals and snacks. Protein options include lean meats, poultry, seafood, beans, nuts, nut butters, eggs, cheese, and yogurt. ? Try eating a protein-rich snack before bed. Examples of a protein-rick snack include cheese and crackers or a peanut butter sandwich made with 1 slice of whole-wheat bread and 1 tsp (5 g) of peanut butter. ? Eat or suck on things that have ginger in them. It may help relieve nausea. Add  tsp ground ginger to hot tea or choose ginger tea. ? Try drinking 100% fruit juice or an electrolyte drink. An electrolyte drink contains sodium, potassium, and chloride. ? Drink fluids that are cold, clear, and carbonated or sour. Examples include lemonade, ginger ale, lemon-lime soda, ice water, and sparkling water. ? Brush your teeth or use a mouth rinse after meals. ? Talk with your health care provider about starting a supplement of vitamin B6. General instructions  Take over-the-counter and prescription medicines only as told by your health care provider.  Follow instructions from your health care provider about eating or drinking restrictions.  Continue to take your prenatal vitamins as told by your health care provider. If you are having trouble taking your prenatal vitamins, talk with your health care provider  about different options.  Keep all follow-up and pre-birth (prenatal) visits as told by your health care provider. This is important. Contact a health care provider if:  You have pain in your abdomen.  You have a severe headache.  You have vision problems.  You are losing weight.  You feel weak or dizzy. Get help right away if:  You cannot drink fluids without vomiting.  You vomit blood.  You have constant nausea and vomiting.  You are very weak.  You faint.  You have a fever and your symptoms suddenly get worse. Summary  Hyperemesis gravidarum is a severe form of nausea and vomiting that happens during pregnancy.  Making some changes to your eating habits may help relieve nausea and vomiting.  This condition may be managed with medicine.  If medicines do not help relieve nausea and vomiting, you may need to receive fluids through an IV at the hospital. This information is not intended to replace advice given to you by your health care provider.   Make sure you discuss any questions you have with your health care provider. Document Revised: 09/26/2017 Document Reviewed: 05/05/2016 Elsevier Patient Education  2020 [employer]   

## 2019-11-16 LAB — RPR+RH+ABO+RUB AB+AB SCR+CB...
Antibody Screen: NEGATIVE
HIV Screen 4th Generation wRfx: NONREACTIVE
Hematocrit: 41.5 % (ref 34.0–46.6)
Hemoglobin: 14.4 g/dL (ref 11.1–15.9)
Hepatitis B Surface Ag: NEGATIVE
MCH: 30.6 pg (ref 26.6–33.0)
MCHC: 34.7 g/dL (ref 31.5–35.7)
MCV: 88 fL (ref 79–97)
Platelets: 259 10*3/uL (ref 150–450)
RBC: 4.71 x10E6/uL (ref 3.77–5.28)
RDW: 13.4 % (ref 11.7–15.4)
RPR Ser Ql: NONREACTIVE
Rh Factor: NEGATIVE
Rubella Antibodies, IGG: 7.1 index (ref >0.99)
Varicella zoster IgG: 2721 index (ref >165)
WBC: 10.7 10*3/uL (ref 3.4–10.8)

## 2019-11-16 LAB — HEMOGLOBINOPATHY EVALUATION
HGB C: 0 %
HGB S: 0 %
HGB VARIANT: 0 %
Hemoglobin A2 Quantitation: 2.6 % (ref 1.8–3.2)
Hemoglobin F Quantitation: 0 % (ref 0.0–2.0)
Hgb A: 97.4 % (ref 96.4–98.8)

## 2019-11-16 LAB — GLUCOSE, 1 HOUR GESTATIONAL: Gestational Diabetes Screen: 102 mg/dL (ref 65–139)

## 2019-11-22 LAB — MATERNIT21 PLUS CORE+SCA
Fetal Fraction: 9
Monosomy X (Turner Syndrome): NOT DETECTED
Result (T21): NEGATIVE
Trisomy 13 (Patau syndrome): NEGATIVE
Trisomy 18 (Edwards syndrome): NEGATIVE
Trisomy 21 (Down syndrome): NEGATIVE
XXX (Triple X Syndrome): NOT DETECTED
XXY (Klinefelter Syndrome): NOT DETECTED
XYY (Jacobs Syndrome): NOT DETECTED

## 2019-11-28 ENCOUNTER — Other Ambulatory Visit: Payer: Self-pay

## 2019-11-28 ENCOUNTER — Ambulatory Visit (INDEPENDENT_AMBULATORY_CARE_PROVIDER_SITE_OTHER): Payer: Medicaid Other | Admitting: Advanced Practice Midwife

## 2019-11-28 ENCOUNTER — Encounter: Payer: Self-pay | Admitting: Advanced Practice Midwife

## 2019-11-28 VITALS — BP 120/80 | Wt 255.0 lb

## 2019-11-28 DIAGNOSIS — Z3A12 12 weeks gestation of pregnancy: Secondary | ICD-10-CM

## 2019-11-28 DIAGNOSIS — O99341 Other mental disorders complicating pregnancy, first trimester: Secondary | ICD-10-CM

## 2019-11-28 DIAGNOSIS — O99211 Obesity complicating pregnancy, first trimester: Secondary | ICD-10-CM

## 2019-11-28 DIAGNOSIS — O09891 Supervision of other high risk pregnancies, first trimester: Secondary | ICD-10-CM

## 2019-11-28 LAB — POCT URINALYSIS DIPSTICK OB
Glucose, UA: NEGATIVE
POC,PROTEIN,UA: NEGATIVE

## 2019-11-28 NOTE — Patient Instructions (Signed)

## 2019-11-28 NOTE — Progress Notes (Signed)
  Routine Prenatal Care Visit  Subjective  Emily Payne is a 29 y.o. G3P1011 at [redacted]w[redacted]d being seen today for ongoing prenatal care.  She is currently monitored for the following issues for this high-risk pregnancy and has Obesity; Dandruff; Depression; Generalized anxiety disorder with panic attacks; S/P laparoscopic surgery; and Supervision of high risk pregnancy in first trimester on their problem list.  ----------------------------------------------------------------------------------- Patient reports nausea improving on current medication.   Contractions: Not present. Vag. Bleeding: None.  Movement: Present. Leaking Fluid denies.  ----------------------------------------------------------------------------------- The following portions of the patient's history were reviewed and updated as appropriate: allergies, current medications, past family history, past medical history, past social history, past surgical history and problem list. Problem list updated.  Objective  Blood pressure 120/80, weight 255 lb (115.7 kg), last menstrual period 09/01/2019. Pregravid weight 258 lb (117 kg) Total Weight Gain -3 lb (-1.361 kg) Urinalysis: Urine Protein Negative  Urine Glucose Negative  Fetal Status: Fetal Heart Rate (bpm): 165   Movement: Present     Unable to hear heart tones with doppler. Movement and heart rate confirmed with bedside ultrasound.  General:  Alert, oriented and cooperative. Patient is in no acute distress.  Skin: Skin is warm and dry. No rash noted.   Cardiovascular: Normal heart rate noted  Respiratory: Normal respiratory effort, no problems with respiration noted  Abdomen: Soft, gravid, appropriate for gestational age. Pain/Pressure: Absent     Pelvic:  Cervical exam deferred        Extremities: Normal range of motion.     Mental Status: Normal mood and affect. Normal behavior. Normal judgment and thought content.   Assessment   29 y.o. G3P1011 at [redacted]w[redacted]d by  06/07/2020, by  Last Menstrual Period presenting for routine prenatal visit  Plan   Pregnancy#3 Problems (from 09/01/19 to present)    Problem Noted Resolved   Supervision of high risk pregnancy in first trimester 11/14/2019 by Natale Milch, MD No   Overview Addendum 11/14/2019 12:23 PM by Natale Milch, MD     Nursing Staff Provider  Office Location  [facility] Dating   LMP=7wk [place]  Language  English Anatomy US    Flu Vaccine   Genetic Screen  NIPS:   AFP:   First Screen:    TDaP vaccine    Hgb A1C or  GTT Early : Third trimester :   Rhogam     LAB RESULTS   Feeding Plan  Blood Type --/--/A NEG (10/07 2219)   Contraception  Antibody NEG (10/07 2219)  Circumcision  Rubella    Pediatrician   RPR     Support Person  HBsAg     Prenatal Classes  HIV      Varicella   BTL Consent  GBS  (For PCN allergy, check sensitivities)        VBAC Consent  Desires repeat cesarean Pap  06/2017 NIL    Hgb Electro      CF      SMA                   Preterm labor symptoms and general obstetric precautions including but not limited to vaginal bleeding, contractions, leaking of fluid and fetal movement were reviewed in detail with the patient. Please refer to After Visit Summary for other counseling recommendations.   Return in about 4 weeks (around 12/26/2019) for rob.  [name], CNM 11/28/2019 9:31 AM

## 2019-12-26 ENCOUNTER — Ambulatory Visit (INDEPENDENT_AMBULATORY_CARE_PROVIDER_SITE_OTHER): Payer: Medicaid Other | Admitting: Obstetrics and Gynecology

## 2019-12-26 ENCOUNTER — Other Ambulatory Visit: Payer: Self-pay

## 2019-12-26 VITALS — BP 114/80 | Wt 259.0 lb

## 2019-12-26 DIAGNOSIS — O99211 Obesity complicating pregnancy, first trimester: Secondary | ICD-10-CM

## 2019-12-26 DIAGNOSIS — O34219 Maternal care for unspecified type scar from previous cesarean delivery: Secondary | ICD-10-CM

## 2019-12-26 DIAGNOSIS — Z3A16 16 weeks gestation of pregnancy: Secondary | ICD-10-CM

## 2019-12-26 DIAGNOSIS — O0991 Supervision of high risk pregnancy, unspecified, first trimester: Secondary | ICD-10-CM

## 2019-12-26 NOTE — Progress Notes (Signed)
ROB No concerns Denies cramping/spotting  

## 2019-12-26 NOTE — Progress Notes (Signed)
    Routine Prenatal Care Visit  Subjective  Emily Payne is a 29 y.o. G3P1011 at [redacted]w[redacted]d being seen today for ongoing prenatal care.  She is currently monitored for the following issues for this high-risk pregnancy and has Obesity; Dandruff; Depression; Generalized anxiety disorder with panic attacks; S/P laparoscopic surgery; and Supervision of high risk pregnancy in first trimester on their problem list.  ----------------------------------------------------------------------------------- Patient reports no complaints.   Contractions: Not present. Vag. Bleeding: None.  Movement: Present. Denies leaking of fluid.  ----------------------------------------------------------------------------------- The following portions of the patient's history were reviewed and updated as appropriate: allergies, current medications, past family history, past medical history, past social history, past surgical history and problem list. Problem list updated.   Objective  Blood pressure 114/80, weight 259 lb (117.5 kg), last menstrual period 09/01/2019. Pregravid weight 258 lb (117 kg) Total Weight Gain 1 lb (0.454 kg) Urinalysis:      Fetal Status: Fetal Heart Rate (bpm): 169   Movement: Present     General:  Alert, oriented and cooperative. Patient is in no acute distress.  Skin: Skin is warm and dry. No rash noted.   Cardiovascular: Normal heart rate noted  Respiratory: Normal respiratory effort, no problems with respiration noted  Abdomen: Soft, gravid, appropriate for gestational age. Pain/Pressure: Absent     Pelvic:  Cervical exam deferred        Extremities: Normal range of motion.     Mental Status: Normal mood and affect. Normal behavior. Normal judgment and thought content.     Assessment   29 y.o. G3P1011 at [redacted]w[redacted]d by  06/07/2020, by Last Menstrual Period presenting for routine prenatal visit  Plan   Pregnancy#3 Problems (from 09/01/19 to present)    Problem Noted Resolved   Supervision  of high risk pregnancy in first trimester 11/14/2019 by Natale Milch, MD No   Overview Addendum 12/26/2019  9:02 AM by Natale Milch, MD     Nursing Staff Provider  Office Location  [facility] Dating   LMP=7wk [place]  Language  English Anatomy US    Flu Vaccine   Genetic Screen  NIPS:normal XX  TDaP vaccine    Hgb A1C or  GTT Early :102 Third trimester :   Rhogam     LAB RESULTS   Feeding Plan  Blood Type A/Negative/-- (02/24 1026)   Contraception  Antibody Negative (02/24 1026)  Circumcision  Rubella 7.10 (02/24 1026)  Pediatrician   RPR Non Reactive (02/24 1026)   Support Person  HBsAg Negative (02/24 1026)   Prenatal Classes  HIV Non Reactive (02/24 1026)    Varicella immune  BTL Consent  GBS  (For PCN allergy, check sensitivities)        VBAC Consent  Desires TOLAC Pap  06/2017 NIL    Hgb Electro      CF      SMA                   Patient is interested in TOLAC, discussed risks of uterine rupture. Will continue conversation as she gets closure to term.  Gestational age appropriate obstetric precautions including but not limited to vaginal bleeding, contractions, leaking of fluid and fetal movement were reviewed in detail with the patient.    Return in about 4 weeks (around 01/23/2020) for ROB and anatomy US.  Natale Milch MD [facility], [facility] 12/26/2019, 9:24 AM

## 2019-12-26 NOTE — Patient Instructions (Signed)

## 2020-01-23 ENCOUNTER — Other Ambulatory Visit: Payer: Self-pay

## 2020-01-23 ENCOUNTER — Ambulatory Visit (INDEPENDENT_AMBULATORY_CARE_PROVIDER_SITE_OTHER): Payer: Medicaid Other | Admitting: Obstetrics and Gynecology

## 2020-01-23 ENCOUNTER — Encounter: Payer: Self-pay | Admitting: Obstetrics and Gynecology

## 2020-01-23 VITALS — BP 122/74 | Wt 255.0 lb

## 2020-01-23 DIAGNOSIS — Z3A20 20 weeks gestation of pregnancy: Secondary | ICD-10-CM | POA: Diagnosis not present

## 2020-01-23 DIAGNOSIS — R300 Dysuria: Secondary | ICD-10-CM

## 2020-01-23 DIAGNOSIS — O0991 Supervision of high risk pregnancy, unspecified, first trimester: Secondary | ICD-10-CM

## 2020-01-23 DIAGNOSIS — O99212 Obesity complicating pregnancy, second trimester: Secondary | ICD-10-CM

## 2020-01-23 DIAGNOSIS — Z6837 Body mass index (BMI) 37.0-37.9, adult: Secondary | ICD-10-CM

## 2020-01-23 LAB — POCT URINALYSIS DIPSTICK
Bilirubin, UA: NEGATIVE
Blood, UA: POSITIVE
Glucose, UA: NEGATIVE
Ketones, UA: NEGATIVE
Leukocytes, UA: NEGATIVE
Nitrite, UA: NEGATIVE
Protein, UA: NEGATIVE
Spec Grav, UA: 1.015 (ref 1.010–1.025)
Urobilinogen, UA: NEGATIVE E.U./dL — AB
pH, UA: 7 (ref 5.0–8.0)

## 2020-01-23 MED ORDER — NITROFURANTOIN MONOHYD MACRO 100 MG PO CAPS: 100.0000 mg | ORAL_CAPSULE | Freq: Two times a day (BID) | ORAL | 0 refills | Status: DC

## 2020-01-23 NOTE — Progress Notes (Signed)
Routine Prenatal Care Visit  Subjective  Emily Payne is a 29 y.o. G3P1011 at [redacted]w[redacted]d being seen today for ongoing prenatal care.  She is currently monitored for the following issues for this high-risk pregnancy and has Obesity affecting pregnancy; Dandruff; Depression; Generalized anxiety disorder with panic attacks; S/P laparoscopic surgery; Supervision of high risk pregnancy in first trimester; and BMI 37.0-37.9, adult on their problem list.  ----------------------------------------------------------------------------------- Patient reports two days of dysuria. Notes increased frequency.  Denies hematuria.  Denies back pain and fever and chills.   Contractions: Not present. Vag. Bleeding: None.  Movement: Present. Leaking Fluid denies.  ----------------------------------------------------------------------------------- The following portions of the patient's history were reviewed and updated as appropriate: allergies, current medications, past family history, past medical history, past social history, past surgical history and problem list. Problem list updated.  Objective  Blood pressure 122/74, weight 255 lb (115.7 kg), last menstrual period 09/01/2019. Pregravid weight 258 lb (117 kg) Total Weight Gain -3 lb (-1.361 kg) Urinalysis: Urine Protein    Urine Glucose    Fetal Status: Fetal Heart Rate (bpm): 155   Movement: Present     General:  Alert, oriented and cooperative. Patient is in no acute distress.  Skin: Skin is warm and dry. No rash noted.   Cardiovascular: Normal heart rate noted  Respiratory: Normal respiratory effort, no problems with respiration noted  Abdomen: Soft, gravid, appropriate for gestational age. Pain/Pressure: Present   , no CVAT  Pelvic:  Cervical exam deferred        Extremities: Normal range of motion.     Mental Status: Normal mood and affect. Normal behavior. Normal judgment and thought content.   UA: Negative apart from +blood   Assessment   29 y.o.  G3P1011 at [redacted]w[redacted]d by  06/07/2020, by Last Menstrual Period presenting for work-in prenatal visit  Plan   Pregnancy#3 Problems (from 09/01/19 to present)    Problem Noted Resolved   BMI 37.0-37.9, adult 01/23/2020 by Conard Novak, MD No   Supervision of high risk pregnancy in first trimester 11/14/2019 by Natale Milch, MD No   Overview Addendum 12/26/2019  9:25 AM by Natale Milch, MD     Nursing Staff Provider  Office Location  [facility] Dating   LMP=7wk [place]  Language  English Anatomy US    Flu Vaccine   Genetic Screen  NIPS:normal XX  TDaP vaccine    Hgb A1C or  GTT Early :102 Third trimester :   Rhogam     LAB RESULTS   Feeding Plan  Blood Type A/Negative/-- (02/24 1026)   Contraception  Antibody Negative (02/24 1026)  Circumcision  Rubella 7.10 (02/24 1026)  Pediatrician   RPR Non Reactive (02/24 1026)   Support Person  HBsAg Negative (02/24 1026)   Prenatal Classes  HIV Non Reactive (02/24 1026)    Varicella immune  BTL Consent  GBS  (For PCN allergy, check sensitivities)        VBAC Consent Desires TOLAC Pap  06/2017 NIL    Hgb Electro      CF      SMA               Obesity affecting pregnancy 07/04/2012 by Delbert Phenix, NP No   Overview Signed 10/11/2018  5:36 PM by Kerman Passey, MD    Referred to nutritionist -- did not attend Jan 2020          Preterm labor symptoms and general obstetric precautions including but not  limited to vaginal bleeding, contractions, leaking of fluid and fetal movement were reviewed in detail with the patient. Please refer to After Visit Summary for other counseling recommendations.   - Treat for UTI, urine culture sent.   Keep previously scheduled appt for 5/7/  Prentice Docker, MD, [place] 01/23/2020 12:03 PM

## 2020-01-25 ENCOUNTER — Ambulatory Visit (INDEPENDENT_AMBULATORY_CARE_PROVIDER_SITE_OTHER): Payer: Medicaid Other

## 2020-01-25 ENCOUNTER — Other Ambulatory Visit: Payer: Self-pay

## 2020-01-25 ENCOUNTER — Ambulatory Visit (INDEPENDENT_AMBULATORY_CARE_PROVIDER_SITE_OTHER): Payer: Medicaid Other | Admitting: Obstetrics and Gynecology

## 2020-01-25 ENCOUNTER — Encounter: Payer: Self-pay | Admitting: Obstetrics and Gynecology

## 2020-01-25 VITALS — BP 118/80 | Wt 256.0 lb

## 2020-01-25 DIAGNOSIS — O0991 Supervision of high risk pregnancy, unspecified, first trimester: Secondary | ICD-10-CM

## 2020-01-25 DIAGNOSIS — O99212 Obesity complicating pregnancy, second trimester: Secondary | ICD-10-CM

## 2020-01-25 DIAGNOSIS — Z3A20 20 weeks gestation of pregnancy: Secondary | ICD-10-CM

## 2020-01-25 LAB — POCT URINALYSIS DIPSTICK OB
Glucose, UA: NEGATIVE
POC,PROTEIN,UA: NEGATIVE

## 2020-01-25 LAB — URINE CULTURE

## 2020-01-25 NOTE — Progress Notes (Signed)
Routine Prenatal Care Visit  Subjective  Emily Payne is a 29 y.o. G3P1011 at [redacted]w[redacted]d being seen today for ongoing prenatal care.  She is currently monitored for the following issues for this high-risk pregnancy and has Obesity affecting pregnancy; Dandruff; Depression; Generalized anxiety disorder with panic attacks; S/P laparoscopic surgery; Supervision of high risk pregnancy in first trimester; and BMI 37.0-37.9, adult on their problem list.  ----------------------------------------------------------------------------------- Patient reports no complaints.   Contractions: Not present. Vag. Bleeding: None.  Movement: Present. Denies leaking of fluid.  ----------------------------------------------------------------------------------- The following portions of the patient's history were reviewed and updated as appropriate: allergies, current medications, past family history, past medical history, past social history, past surgical history and problem list. Problem list updated.   Objective  Blood pressure 118/80, weight 256 lb (116.1 kg), last menstrual period 09/01/2019. Pregravid weight 258 lb (117 kg) Total Weight Gain -2 lb (-0.907 kg) Urinalysis:      Fetal Status: Fetal Heart Rate (bpm): 150   Movement: Present     General:  Alert, oriented and cooperative. Patient is in no acute distress.  Skin: Skin is warm and dry. No rash noted.   Cardiovascular: Normal heart rate noted  Respiratory: Normal respiratory effort, no problems with respiration noted  Abdomen: Soft, gravid, appropriate for gestational age. Pain/Pressure: Absent     Pelvic:  Cervical exam deferred        Extremities: Normal range of motion.  Edema: None  Mental Status: Normal mood and affect. Normal behavior. Normal judgment and thought content.     Assessment   29 y.o. G3P1011 at [redacted]w[redacted]d by  06/07/2020, by Last Menstrual Period presenting for routine prenatal visit  Plan   Pregnancy#3 Problems (from 09/01/19  to present)    Problem Noted Resolved   BMI 37.0-37.9, adult 01/23/2020 by Will Bonnet, MD No   Supervision of high risk pregnancy in first trimester 11/14/2019 by Homero Fellers, MD No   Overview Addendum 12/26/2019  9:25 AM by Homero Fellers, MD     Nursing Staff Provider  Office Location  [facility] Dating   LMP=7wk [place]  Language  English Anatomy US    Flu Vaccine   Genetic Screen  NIPS:normal XX  TDaP vaccine    Hgb A1C or  GTT Early :102 Third trimester :   Rhogam     LAB RESULTS   Feeding Plan  Blood Type A/Negative/-- (02/24 1026)   Contraception  Antibody Negative (02/24 1026)  Circumcision  Rubella 7.10 (02/24 1026)  Pediatrician   RPR Non Reactive (02/24 1026)   Support Person  HBsAg Negative (02/24 1026)   Prenatal Classes  HIV Non Reactive (02/24 1026)    Varicella immune  BTL Consent  GBS  (For PCN allergy, check sensitivities)        VBAC Consent Desires TOLAC Pap  06/2017 NIL    Hgb Electro      CF      SMA               Obesity affecting pregnancy 07/04/2012 by Olegario Messier, NP No   Overview Signed 10/11/2018  5:36 PM by Arnetha Courser, MD    Referred to nutritionist -- did not attend Jan 2020          Gestational age appropriate obstetric precautions including but not limited to vaginal bleeding, contractions, leaking of fluid and fetal movement were reviewed in detail with the patient.    Return in about 2 weeks (  around 02/08/2020) for ROB in person with follow up anatomy.  Natale Milch MD [facility], [facility] 01/25/2020, 2:24 PM

## 2020-01-25 NOTE — Patient Instructions (Signed)

## 2020-02-08 ENCOUNTER — Other Ambulatory Visit: Payer: Self-pay

## 2020-02-08 ENCOUNTER — Encounter: Payer: Self-pay | Admitting: Obstetrics and Gynecology

## 2020-02-08 ENCOUNTER — Ambulatory Visit (INDEPENDENT_AMBULATORY_CARE_PROVIDER_SITE_OTHER): Payer: Medicaid Other | Admitting: Obstetrics and Gynecology

## 2020-02-08 ENCOUNTER — Ambulatory Visit (INDEPENDENT_AMBULATORY_CARE_PROVIDER_SITE_OTHER): Payer: Medicaid Other

## 2020-02-08 VITALS — BP 120/78 | Wt 257.0 lb

## 2020-02-08 DIAGNOSIS — O0991 Supervision of high risk pregnancy, unspecified, first trimester: Secondary | ICD-10-CM

## 2020-02-08 DIAGNOSIS — O99212 Obesity complicating pregnancy, second trimester: Secondary | ICD-10-CM

## 2020-02-08 DIAGNOSIS — Z3A22 22 weeks gestation of pregnancy: Secondary | ICD-10-CM

## 2020-02-08 LAB — POCT URINALYSIS DIPSTICK OB
Glucose, UA: NEGATIVE
POC,PROTEIN,UA: NEGATIVE

## 2020-02-08 NOTE — Patient Instructions (Signed)

## 2020-02-08 NOTE — Progress Notes (Signed)
Routine Prenatal Care Visit  Subjective  Emily Payne is a 29 y.o. G3P1011 at [redacted]w[redacted]d being seen today for ongoing prenatal care.  She is currently monitored for the following issues for this high-risk pregnancy and has Obesity affecting pregnancy; Dandruff; Depression; Generalized anxiety disorder with panic attacks; S/P laparoscopic surgery; Supervision of high risk pregnancy in first trimester; and BMI 37.0-37.9, adult on their problem list.  ----------------------------------------------------------------------------------- Patient reports some continue hip pain. Trying home exercises. Declines PT referral at this time. Contractions: Not present. Vag. Bleeding: None.  Movement: Present. Denies leaking of fluid.  ----------------------------------------------------------------------------------- The following portions of the patient's history were reviewed and updated as appropriate: allergies, current medications, past family history, past medical history, past social history, past surgical history and problem list. Problem list updated.   Objective  Blood pressure 120/78, weight 257 lb (116.6 kg), last menstrual period 09/01/2019. Pregravid weight 258 lb (117 kg) Total Weight Gain -1 lb (-0.454 kg) Urinalysis:      Fetal Status: Fetal Heart Rate (bpm): 130   Movement: Present     General:  Alert, oriented and cooperative. Patient is in no acute distress.  Skin: Skin is warm and dry. No rash noted.   Cardiovascular: Normal heart rate noted  Respiratory: Normal respiratory effort, no problems with respiration noted  Abdomen: Soft, gravid, appropriate for gestational age. Pain/Pressure: Absent     Pelvic:  Cervical exam deferred        Extremities: Normal range of motion.  Edema: None  Mental Status: Normal mood and affect. Normal behavior. Normal judgment and thought content.     Assessment   29 y.o. G3P1011 at [redacted]w[redacted]d by  06/07/2020, by Last Menstrual Period presenting for  routine prenatal visit  Plan   Pregnancy#3 Problems (from 09/01/19 to present)    Problem Noted Resolved   BMI 37.0-37.9, adult 01/23/2020 by Conard Novak, MD No   Supervision of high risk pregnancy in first trimester 11/14/2019 by Natale Milch, MD No   Overview Addendum 01/25/2020  2:25 PM by Natale Milch, MD     Nursing Staff Provider  Office Location  [facility] Dating   LMP=7wk [place]  Language  English Anatomy US    Flu Vaccine   Genetic Screen  NIPS:normal XX  TDaP vaccine    Hgb A1C or  GTT Early :102 Third trimester :   Rhogam     LAB RESULTS   Feeding Plan  breast Blood Type A/Negative/-- (02/24 1026)   Contraception  Antibody Negative (02/24 1026)  Circumcision  Rubella 7.10 (02/24 1026)  Pediatrician   RPR Non Reactive (02/24 1026)   Support Person  HBsAg Negative (02/24 1026)   Prenatal Classes  HIV Non Reactive (02/24 1026)    Varicella immune  BTL Consent  GBS  (For PCN allergy, check sensitivities)        VBAC Consent Desires TOLAC Pap  06/2017 NIL    Hgb Electro      CF      SMA               Obesity affecting pregnancy 07/04/2012 by Delbert Phenix, NP No   Overview Signed 10/11/2018  5:36 PM by Kerman Passey, MD    Referred to nutritionist -- did not attend Jan 2020         Incomplete Anatomy US- will need to follow up with MFM Discussed Covid vaccination  Gestational age appropriate obstetric precautions including but not limited to vaginal  bleeding, contractions, leaking of fluid and fetal movement were reviewed in detail with the patient.    Return in about 2 weeks (around 02/22/2020) for ROB in person.  Homero Fellers MD [facility], [facility] 02/08/2020, 11:01 AM

## 2020-02-22 ENCOUNTER — Other Ambulatory Visit: Payer: Self-pay

## 2020-02-22 ENCOUNTER — Encounter: Payer: Self-pay | Admitting: Obstetrics and Gynecology

## 2020-02-22 ENCOUNTER — Ambulatory Visit (INDEPENDENT_AMBULATORY_CARE_PROVIDER_SITE_OTHER): Payer: Medicaid Other | Admitting: Obstetrics and Gynecology

## 2020-02-22 VITALS — BP 130/74 | Ht 70.0 in | Wt 262.6 lb

## 2020-02-22 DIAGNOSIS — O0991 Supervision of high risk pregnancy, unspecified, first trimester: Secondary | ICD-10-CM

## 2020-02-22 DIAGNOSIS — O99343 Other mental disorders complicating pregnancy, third trimester: Secondary | ICD-10-CM

## 2020-02-22 DIAGNOSIS — F419 Anxiety disorder, unspecified: Secondary | ICD-10-CM

## 2020-02-22 DIAGNOSIS — O99212 Obesity complicating pregnancy, second trimester: Secondary | ICD-10-CM

## 2020-02-22 DIAGNOSIS — Z3A24 24 weeks gestation of pregnancy: Secondary | ICD-10-CM | POA: Diagnosis not present

## 2020-02-22 LAB — POCT URINALYSIS DIPSTICK OB
Glucose, UA: NEGATIVE
POC,PROTEIN,UA: NEGATIVE

## 2020-02-22 MED ORDER — ESCITALOPRAM OXALATE 20 MG PO TABS: 20.0000 mg | ORAL_TABLET | Freq: Every day | ORAL | 11 refills | Status: DC

## 2020-02-22 NOTE — Progress Notes (Signed)
Routine Prenatal Care Visit  Subjective  Emily Payne is a 29 y.o. G3P1011 at [redacted]w[redacted]d being seen today for ongoing prenatal care.  She is currently monitored for the following issues for this low-risk pregnancy and has Obesity affecting pregnancy; Dandruff; Depression; Generalized anxiety disorder with panic attacks; S/P laparoscopic surgery; Supervision of high risk pregnancy in first trimester; and BMI 37.0-37.9, adult on their problem list.  ----------------------------------------------------------------------------------- Patient reports anxiety regarding birth. Poor birth experience before..   Contractions: Not present. Vag. Bleeding: None.  Movement: Present. Denies leaking of fluid.  ----------------------------------------------------------------------------------- The following portions of the patient's history were reviewed and updated as appropriate: allergies, current medications, past family history, past medical history, past social history, past surgical history and problem list. Problem list updated.   Objective  Blood pressure 130/74, height 5\' 10"  (1.778 m), weight 262 lb 9.6 oz (119.1 kg), last menstrual period 09/01/2019. Pregravid weight 258 lb (117 kg) Total Weight Gain 4 lb 9.6 oz (2.087 kg) Urinalysis:      Fetal Status: Fetal Heart Rate (bpm): 150 Fundal Height: 29 cm Movement: Present     General:  Alert, oriented and cooperative. Patient is in no acute distress.  Skin: Skin is warm and dry. No rash noted.   Cardiovascular: Normal heart rate noted  Respiratory: Normal respiratory effort, no problems with respiration noted  Abdomen: Soft, gravid, appropriate for gestational age. Pain/Pressure: Absent     Pelvic:  Cervical exam deferred        Extremities: Normal range of motion.  Edema: None  Mental Status: Normal mood and affect. Normal behavior. Normal judgment and thought content.     Assessment   29 y.o. G3P1011 at [redacted]w[redacted]d by  06/07/2020, by Last  Menstrual Period presenting for routine prenatal visit  Plan   Pregnancy#3 Problems (from 09/01/19 to present)    Problem Noted Resolved   BMI 37.0-37.9, adult 01/23/2020 by Will Bonnet, MD No   Supervision of high risk pregnancy in first trimester 11/14/2019 by Homero Fellers, MD No   Overview Addendum 02/22/2020 12:20 PM by Homero Fellers, MD     Nursing Staff Provider  Office Location  [facility] Dating   LMP=7wk [place]  Language  English Anatomy US  incomplete  Flu Vaccine   Genetic Screen  NIPS:normal XX  TDaP vaccine    Hgb A1C or  GTT Early :102 Third trimester :   Rhogam     LAB RESULTS   Feeding Plan  breast Blood Type A/Negative/-- (02/24 1026)   Contraception  Antibody Negative (02/24 1026)  Circumcision  Rubella 7.10 (02/24 1026)  Pediatrician   RPR Non Reactive (02/24 1026)   Support Person  HBsAg Negative (02/24 1026)   Prenatal Classes  HIV Non Reactive (02/24 1026)    Varicella immune  BTL Consent  GBS  (For PCN allergy, check sensitivities)        VBAC Consent Desires TOLAC Pap  06/2017 NIL    Hgb Electro      CF      SMA               Obesity affecting pregnancy 07/04/2012 by Olegario Messier, NP No   Overview Signed 10/11/2018  5:36 PM by Arnetha Courser, MD    Referred to nutritionist -- did not attend Jan 2020          Encouraged patient to seek therapy for birth trauma concerns Refill for lexapro sent.  Gestational age appropriate obstetric  precautions including but not limited to vaginal bleeding, contractions, leaking of fluid and fetal movement were reviewed in detail with the patient.    Return in about 2 weeks (around 03/07/2020) for ROB in person- 1 hr GTT.  Natale Milch MD [facility], [facility] 02/22/2020, 12:20 PM

## 2020-02-22 NOTE — Patient Instructions (Addendum)
[facility], [employer] [place] Same Day Access Hours:  Monday, Wednesday and Friday, 8am - 3pm Walk-In Crisis Hours: 7 days/wk, 8am - 8pm [address] [place], [place] [address] [phone]  [facility] [phone]  Mobile Crisis Unit [phone]   Therapists/Counselors/Psychologists  [facility] [address] [facility] [address] [place], [place] [address] [phone]  Karen [place], [place]  & Jacqlyn Krauss [place]    [phone]        [address]       [place], [place] [address]        Ival Bible, CSW [phone] [address] [place], [place] [address]  Harle Battiest, [place]        [phone]        [address]      [place], [place] [address]        Chyrel Masson, MS [phone] [address] [place], [place] [address]   Oscar La, LMFT       [phone]        [address]       [place], [place] [address]        Felecia Jan [phone] [address] [place], [place] [address]  Lester Winger        [phone]        [address]       [place], [place] [address]        Kerin Salen [phone] [address] [place], [place] [address]  Tyron Russell, PsyD       [phone]        [address]       [place], [place] [address]        Elita Quick, LPC [phone] [address]  [place], [place] [address]   Debarah Crape        [phone]        [address] [place], [place] [address]         Rosana [facility] [phone] [email or web address]   Sation Konchella       [phone]        [address]       [place], [place] [address]        [facility] [phone] [email or web address]     Second Trimester of Pregnancy The second trimester is from week 14 through week 27 (months 4 through 6). The second trimester is often a time when you feel  your best. Your body has adjusted to being pregnant, and you begin to feel better physically. Usually, morning sickness has lessened or quit completely, you may have more energy, and you may have an increase in appetite. The second trimester is also a time when the fetus is growing rapidly. At the end of the sixth month, the fetus is about 9 inches long and weighs about 1 pounds. You will likely begin to feel the baby move (quickening) between 16 and 20 weeks of pregnancy. Body changes during your second trimester Your body continues to go through many changes during your second trimester. The changes vary from woman to woman.  Your weight will continue to increase. You will notice your lower abdomen bulging out.  You may begin to get stretch marks on your hips, abdomen, and breasts.  You may develop headaches that can be relieved by medicines. The medicines  should be approved by your health care provider.  You may urinate more often because the fetus is pressing on your bladder.  You may develop or continue to have heartburn as a result of your pregnancy.  You may develop constipation because certain hormones are causing the muscles that push waste through your intestines to slow down.  You may develop hemorrhoids or swollen, bulging veins (varicose veins).  You may have back pain. This is caused by: ? Weight gain. ? Pregnancy hormones that are relaxing the joints in your pelvis. ? A shift in weight and the muscles that support your balance.  Your breasts will continue to grow and they will continue to become tender.  Your gums may bleed and may be sensitive to brushing and flossing.  Dark spots or blotches (chloasma, mask of pregnancy) may develop on your face. This will likely fade after the baby is born.  A dark line from your belly button to the pubic area (linea nigra) may appear. This will likely fade after the baby is born.  You may have changes in your hair. These can include  thickening of your hair, rapid growth, and changes in texture. Some women also have hair loss during or after pregnancy, or hair that feels dry or thin. Your hair will most likely return to normal after your baby is born. What to expect at prenatal visits During a routine prenatal visit:  You will be weighed to make sure you and the fetus are growing normally.  Your blood pressure will be taken.  Your abdomen will be measured to track your baby's growth.  The fetal heartbeat will be listened to.  Any test results from the previous visit will be discussed. Your health care provider may ask you:  How you are feeling.  If you are feeling the baby move.  If you have had any abnormal symptoms, such as leaking fluid, bleeding, severe headaches, or abdominal cramping.  If you are using any tobacco products, including cigarettes, chewing tobacco, and electronic cigarettes.  If you have any questions. Other tests that may be performed during your second trimester include:  Blood tests that check for: ? Low iron levels (anemia). ? High blood sugar that affects pregnant women (gestational diabetes) between 51 and 28 weeks. ? Rh antibodies. This is to check for a protein on red blood cells (Rh factor).  Urine tests to check for infections, diabetes, or protein in the urine.  An ultrasound to confirm the proper growth and development of the baby.  An amniocentesis to check for possible genetic problems.  Fetal screens for spina bifida and Down syndrome.  HIV (human immunodeficiency virus) testing. Routine prenatal testing includes screening for HIV, unless you choose not to have this test. Follow these instructions at home: Medicines  Follow your health care provider's instructions regarding medicine use. Specific medicines may be either safe or unsafe to take during pregnancy.  Take a prenatal vitamin that contains at least 600 micrograms (mcg) of folic acid.  If you develop  constipation, try taking a stool softener if your health care provider approves. Eating and drinking   Eat a balanced diet that includes fresh fruits and vegetables, whole grains, good sources of protein such as meat, eggs, or tofu, and low-fat dairy. Your health care provider will help you determine the amount of weight gain that is right for you.  Avoid raw meat and uncooked cheese. These carry germs that can cause birth defects in the baby.  If  you have low calcium intake from food, talk to your health care provider about whether you should take a daily calcium supplement.  Limit foods that are high in fat and processed sugars, such as fried and sweet foods.  To prevent constipation: ? Drink enough fluid to keep your urine clear or pale yellow. ? Eat foods that are high in fiber, such as fresh fruits and vegetables, whole grains, and beans. Activity  Exercise only as directed by your health care provider. Most women can continue their usual exercise routine during pregnancy. Try to exercise for 30 minutes at least 5 days a week. Stop exercising if you experience uterine contractions.  Avoid heavy lifting, wear low heel shoes, and practice good posture.  A sexual relationship may be continued unless your health care provider directs you otherwise. Relieving pain and discomfort  Wear a good support bra to prevent discomfort from breast tenderness.  Take warm sitz baths to soothe any pain or discomfort caused by hemorrhoids. Use hemorrhoid cream if your health care provider approves.  Rest with your legs elevated if you have leg cramps or low back pain.  If you develop varicose veins, wear support hose. Elevate your feet for 15 minutes, 3-4 times a day. Limit salt in your diet. Prenatal Care  Write down your questions. Take them to your prenatal visits.  Keep all your prenatal visits as told by your health care provider. This is important. Safety  Wear your seat belt at all  times when driving.  Make a list of emergency phone numbers, including numbers for family, friends, the hospital, and police and fire departments. General instructions  Ask your health care provider for a referral to a local prenatal education class. Begin classes no later than the beginning of month 6 of your pregnancy.  Ask for help if you have counseling or nutritional needs during pregnancy. Your health care provider can offer advice or refer you to specialists for help with various needs.  Do not use hot tubs, steam rooms, or saunas.  Do not douche or use tampons or scented sanitary pads.  Do not cross your legs for long periods of time.  Avoid cat litter boxes and soil used by cats. These carry germs that can cause birth defects in the baby and possibly loss of the fetus by miscarriage or stillbirth.  Avoid all smoking, herbs, alcohol, and unprescribed drugs. Chemicals in these products can affect the formation and growth of the baby.  Do not use any products that contain nicotine or tobacco, such as cigarettes and e-cigarettes. If you need help quitting, ask your health care provider.  Visit your dentist if you have not gone yet during your pregnancy. Use a soft toothbrush to brush your teeth and be gentle when you floss. Contact a health care provider if:  You have dizziness.  You have mild pelvic cramps, pelvic pressure, or nagging pain in the abdominal area.  You have persistent nausea, vomiting, or diarrhea.  You have a bad smelling vaginal discharge.  You have pain when you urinate. Get help right away if:  You have a fever.  You are leaking fluid from your vagina.  You have spotting or bleeding from your vagina.  You have severe abdominal cramping or pain.  You have rapid weight gain or weight loss.  You have shortness of breath with chest pain.  You notice sudden or extreme swelling of your face, hands, ankles, feet, or legs.  You have not felt your baby  move in over an hour.  You have severe headaches that do not go away when you take medicine.  You have vision changes. Summary  The second trimester is from week 14 through week 27 (months 4 through 6). It is also a time when the fetus is growing rapidly.  Your body goes through many changes during pregnancy. The changes vary from woman to woman.  Avoid all smoking, herbs, alcohol, and unprescribed drugs. These chemicals affect the formation and growth your baby.  Do not use any tobacco products, such as cigarettes, chewing tobacco, and e-cigarettes. If you need help quitting, ask your health care provider.  Contact your health care provider if you have any questions. Keep all prenatal visits as told by your health care provider. This is important. This information is not intended to replace advice given to you by your health care provider. Make sure you discuss any questions you have with your health care provider. Document Revised: 12/29/2018 Document Reviewed: 10/12/2016 Elsevier Patient Education  2020 [employer].

## 2020-03-03 ENCOUNTER — Other Ambulatory Visit: Payer: Self-pay | Admitting: Obstetrics and Gynecology

## 2020-03-03 DIAGNOSIS — O99212 Obesity complicating pregnancy, second trimester: Secondary | ICD-10-CM

## 2020-03-06 ENCOUNTER — Telehealth: Payer: Self-pay

## 2020-03-06 NOTE — Telephone Encounter (Signed)
Pt calling; is scheduled for 1hr gtt tomorrow; had it weeks ago; does she need to have it again?  [phone]  Adv pt 'yes' she does need to have it repeated tomorrow.

## 2020-03-07 ENCOUNTER — Encounter: Payer: Self-pay | Admitting: Advanced Practice Midwife

## 2020-03-07 ENCOUNTER — Other Ambulatory Visit: Payer: Self-pay

## 2020-03-07 ENCOUNTER — Ambulatory Visit (INDEPENDENT_AMBULATORY_CARE_PROVIDER_SITE_OTHER): Payer: Medicaid Other | Admitting: Advanced Practice Midwife

## 2020-03-07 ENCOUNTER — Other Ambulatory Visit: Payer: Medicaid Other

## 2020-03-07 VITALS — BP 120/80 | Wt 275.0 lb

## 2020-03-07 DIAGNOSIS — O26892 Other specified pregnancy related conditions, second trimester: Secondary | ICD-10-CM | POA: Diagnosis not present

## 2020-03-07 DIAGNOSIS — Z3A24 24 weeks gestation of pregnancy: Secondary | ICD-10-CM

## 2020-03-07 DIAGNOSIS — Z3A26 26 weeks gestation of pregnancy: Secondary | ICD-10-CM | POA: Diagnosis not present

## 2020-03-07 DIAGNOSIS — O0992 Supervision of high risk pregnancy, unspecified, second trimester: Secondary | ICD-10-CM

## 2020-03-07 DIAGNOSIS — O99212 Obesity complicating pregnancy, second trimester: Secondary | ICD-10-CM

## 2020-03-07 DIAGNOSIS — Z6791 Unspecified blood type, Rh negative: Secondary | ICD-10-CM

## 2020-03-07 DIAGNOSIS — O0991 Supervision of high risk pregnancy, unspecified, first trimester: Secondary | ICD-10-CM | POA: Diagnosis not present

## 2020-03-07 LAB — POCT URINALYSIS DIPSTICK OB
Glucose, UA: NEGATIVE
POC,PROTEIN,UA: NEGATIVE

## 2020-03-07 MED ORDER — RHO D IMMUNE GLOBULIN 1500 UNIT/2ML IJ SOSY
300.0000 ug | PREFILLED_SYRINGE | Freq: Once | INTRAMUSCULAR | Status: AC
  Administered 2020-03-07: 300 ug via INTRAMUSCULAR

## 2020-03-07 NOTE — Addendum Note (Signed)
Addended by: Cornelius Moras D on: 03/07/2020 11:12 AM   Modules accepted: Orders

## 2020-03-07 NOTE — Progress Notes (Signed)
Routine Prenatal Care Visit  Subjective  Emily Payne is a 29 y.o. G3P1011 at 102w6d being seen today for ongoing prenatal care.  She is currently monitored for the following issues for this high-risk pregnancy and has Obesity affecting pregnancy; Dandruff; Depression; Generalized anxiety disorder with panic attacks; S/P laparoscopic surgery; Supervision of high risk pregnancy in first trimester; and BMI 37.0-37.9, adult on their problem list.  ----------------------------------------------------------------------------------- Patient reports hip and groin pain.   Contractions: Not present. Vag. Bleeding: None.  Movement: Present. Leaking Fluid denies.  ----------------------------------------------------------------------------------- The following portions of the patient's history were reviewed and updated as appropriate: allergies, current medications, past family history, past medical history, past social history, past surgical history and problem list. Problem list updated.  Objective  Blood pressure 120/80, weight 275 lb (124.7 kg), last menstrual period 09/01/2019. Pregravid weight 258 lb (117 kg) Total Weight Gain 17 lb (7.711 kg) Urinalysis: Urine Protein    Urine Glucose    Fetal Status: Fetal Heart Rate (bpm): 145   Movement: Present     General:  Alert, oriented and cooperative. Patient is in no acute distress.  Skin: Skin is warm and dry. No rash noted.   Cardiovascular: Normal heart rate noted  Respiratory: Normal respiratory effort, no problems with respiration noted  Abdomen: Soft, gravid, appropriate for gestational age. Pain/Pressure: Absent     Pelvic:  Cervical exam deferred        Extremities: Normal range of motion.     Mental Status: Normal mood and affect. Normal behavior. Normal judgment and thought content.   Assessment   29 y.o. G3P1011 at [redacted]w[redacted]d by  06/07/2020, by Last Menstrual Period presenting for routine prenatal visit  Plan   Pregnancy#3 Problems  (from 09/01/19 to present)    Problem Noted Resolved   BMI 37.0-37.9, adult 01/23/2020 by Will Bonnet, MD No   Supervision of high risk pregnancy in first trimester 11/14/2019 by Homero Fellers, MD No   Overview Addendum 02/22/2020 12:20 PM by Homero Fellers, MD     Nursing Staff Provider  Office Location  [facility] Dating   LMP=7wk [place]  Language  English Anatomy US  incomplete  Flu Vaccine   Genetic Screen  NIPS:normal XX  TDaP vaccine    Hgb A1C or  GTT Early :102 Third trimester :   Rhogam     LAB RESULTS   Feeding Plan  breast Blood Type A/Negative/-- (02/24 1026)   Contraception  Antibody Negative (02/24 1026)  Circumcision  Rubella 7.10 (02/24 1026)  Pediatrician   RPR Non Reactive (02/24 1026)   Support Person  HBsAg Negative (02/24 1026)   Prenatal Classes  HIV Non Reactive (02/24 1026)    Varicella immune  BTL Consent  GBS  (For PCN allergy, check sensitivities)        VBAC Consent Desires TOLAC Pap  06/2017 NIL    Hgb Electro      CF      SMA               Previous Version   Obesity affecting pregnancy 07/04/2012 by Olegario Messier, NP No   Overview Signed 10/11/2018  5:36 PM by Arnetha Courser, MD    Referred to nutritionist -- did not attend Jan 2020       28 week labs and Rhogam today   Hip pain: wear hip support band  Preterm labor symptoms and general obstetric precautions including but not limited to vaginal bleeding, contractions, leaking of  fluid and fetal movement were reviewed in detail with the patient. Please refer to After Visit Summary for other counseling recommendations.   Return in about 2 weeks (around 03/21/2020) for rob.  [name], CNM 03/07/2020 10:16 AM

## 2020-03-07 NOTE — Patient Instructions (Signed)
Third Trimester of Pregnancy The third trimester is from week 28 through week 40 (months 7 through 9). The third trimester is a time when the unborn baby (fetus) is growing rapidly. At the end of the ninth month, the fetus is about 20 inches in length and weighs 6-10 pounds. Body changes during your third trimester Your body will continue to go through many changes during pregnancy. The changes vary from woman to woman. During the third trimester:  Your weight will continue to increase. You can expect to gain 25-35 pounds (11-16 kg) by the end of the pregnancy.  You may begin to get stretch marks on your hips, abdomen, and breasts.  You may urinate more often because the fetus is moving lower into your pelvis and pressing on your bladder.  You may develop or continue to have heartburn. This is caused by increased hormones that slow down muscles in the digestive tract.  You may develop or continue to have constipation because increased hormones slow digestion and cause the muscles that push waste through your intestines to relax.  You may develop hemorrhoids. These are swollen veins (varicose veins) in the rectum that can itch or be painful.  You may develop swollen, bulging veins (varicose veins) in your legs.  You may have increased body aches in the pelvis, back, or thighs. This is due to weight gain and increased hormones that are relaxing your joints.  You may have changes in your hair. These can include thickening of your hair, rapid growth, and changes in texture. Some women also have hair loss during or after pregnancy, or hair that feels dry or thin. Your hair will most likely return to normal after your baby is born.  Your breasts will continue to grow and they will continue to become tender. A yellow fluid (colostrum) may leak from your breasts. This is the first milk you are producing for your baby.  Your belly button may stick out.  You may notice more swelling in your hands,  face, or ankles.  You may have increased tingling or numbness in your hands, arms, and legs. The skin on your belly may also feel numb.  You may feel short of breath because of your expanding uterus.  You may have more problems sleeping. This can be caused by the size of your belly, increased need to urinate, and an increase in your body's metabolism.  You may notice the fetus "dropping," or moving lower in your abdomen (lightening).  You may have increased vaginal discharge.  You may notice your joints feel loose and you may have pain around your pelvic bone. What to expect at prenatal visits You will have prenatal exams every 2 weeks until week 36. Then you will have weekly prenatal exams. During a routine prenatal visit:  You will be weighed to make sure you and the baby are growing normally.  Your blood pressure will be taken.  Your abdomen will be measured to track your baby's growth.  The fetal heartbeat will be listened to.  Any test results from the previous visit will be discussed.  You may have a cervical check near your due date to see if your cervix has softened or thinned (effaced).  You will be tested for Group B streptococcus. This happens between 35 and 37 weeks. Your health care provider may ask you:  What your birth plan is.  How you are feeling.  If you are feeling the baby move.  If you have had any abnormal   symptoms, such as leaking fluid, bleeding, severe headaches, or abdominal cramping.  If you are using any tobacco products, including cigarettes, chewing tobacco, and electronic cigarettes.  If you have any questions. Other tests or screenings that may be performed during your third trimester include:  Blood tests that check for low iron levels (anemia).  Fetal testing to check the health, activity level, and growth of the fetus. Testing is done if you have certain medical conditions or if there are problems during the pregnancy.  Nonstress test  (NST). This test checks the health of your baby to make sure there are no signs of problems, such as the baby not getting enough oxygen. During this test, a belt is placed around your belly. The baby is made to move, and its heart rate is monitored during movement. What is false labor? False labor is a condition in which you feel small, irregular tightenings of the muscles in the womb (contractions) that usually go away with rest, changing position, or drinking water. These are called Braxton Hicks contractions. Contractions may last for hours, days, or even weeks before true labor sets in. If contractions come at regular intervals, become more frequent, increase in intensity, or become painful, you should see your health care provider. What are the signs of labor?  Abdominal cramps.  Regular contractions that start at 10 minutes apart and become stronger and more frequent with time.  Contractions that start on the top of the uterus and spread down to the lower abdomen and back.  Increased pelvic pressure and dull back pain.  A watery or bloody mucus discharge that comes from the vagina.  Leaking of amniotic fluid. This is also known as your "water breaking." It could be a slow trickle or a gush. Let your health care provider know if it has a color or strange odor. If you have any of these signs, call your health care provider right away, even if it is before your due date. Follow these instructions at home: Medicines  Follow your health care provider's instructions regarding medicine use. Specific medicines may be either safe or unsafe to take during pregnancy.  Take a prenatal vitamin that contains at least 600 micrograms (mcg) of folic acid.  If you develop constipation, try taking a stool softener if your health care provider approves. Eating and drinking   Eat a balanced diet that includes fresh fruits and vegetables, whole grains, good sources of protein such as meat, eggs, or tofu,  and low-fat dairy. Your health care provider will help you determine the amount of weight gain that is right for you.  Avoid raw meat and uncooked cheese. These carry germs that can cause birth defects in the baby.  If you have low calcium intake from food, talk to your health care provider about whether you should take a daily calcium supplement.  Eat four or five small meals rather than three large meals a day.  Limit foods that are high in fat and processed sugars, such as fried and sweet foods.  To prevent constipation: ? Drink enough fluid to keep your urine clear or pale yellow. ? Eat foods that are high in fiber, such as fresh fruits and vegetables, whole grains, and beans. Activity  Exercise only as directed by your health care provider. Most women can continue their usual exercise routine during pregnancy. Try to exercise for 30 minutes at least 5 days a week. Stop exercising if you experience uterine contractions.  Avoid heavy lifting.  Do   not exercise in extreme heat or humidity, or at high altitudes.  Wear low-heel, comfortable shoes.  Practice good posture.  You may continue to have sex unless your health care provider tells you otherwise. Relieving pain and discomfort  Take frequent breaks and rest with your legs elevated if you have leg cramps or low back pain.  Take warm sitz baths to soothe any pain or discomfort caused by hemorrhoids. Use hemorrhoid cream if your health care provider approves.  Wear a good support bra to prevent discomfort from breast tenderness.  If you develop varicose veins: ? Wear support pantyhose or compression stockings as told by your healthcare provider. ? Elevate your feet for 15 minutes, 3-4 times a day. Prenatal care  Write down your questions. Take them to your prenatal visits.  Keep all your prenatal visits as told by your health care provider. This is important. Safety  Wear your seat belt at all times when driving.  Make  a list of emergency phone numbers, including numbers for family, friends, the hospital, and police and fire departments. General instructions  Avoid cat litter boxes and soil used by cats. These carry germs that can cause birth defects in the baby. If you have a cat, ask someone to clean the litter box for you.  Do not travel far distances unless it is absolutely necessary and only with the approval of your health care provider.  Do not use hot tubs, steam rooms, or saunas.  Do not drink alcohol.  Do not use any products that contain nicotine or tobacco, such as cigarettes and e-cigarettes. If you need help quitting, ask your health care provider.  Do not use any medicinal herbs or unprescribed drugs. These chemicals affect the formation and growth of the baby.  Do not douche or use tampons or scented sanitary pads.  Do not cross your legs for long periods of time.  To prepare for the arrival of your baby: ? Take prenatal classes to understand, practice, and ask questions about labor and delivery. ? Make a trial run to the hospital. ? Visit the hospital and tour the maternity area. ? Arrange for maternity or paternity leave through employers. ? Arrange for family and friends to take care of pets while you are in the hospital. ? Purchase a rear-facing car seat and make sure you know how to install it in your car. ? Pack your hospital bag. ? Prepare the baby's nursery. Make sure to remove all pillows and stuffed animals from the baby's crib to prevent suffocation.  Visit your dentist if you have not gone during your pregnancy. Use a soft toothbrush to brush your teeth and be gentle when you floss. Contact a health care provider if:  You are unsure if you are in labor or if your water has broken.  You become dizzy.  You have mild pelvic cramps, pelvic pressure, or nagging pain in your abdominal area.  You have lower back pain.  You have persistent nausea, vomiting, or  diarrhea.  You have an unusual or bad smelling vaginal discharge.  You have pain when you urinate. Get help right away if:  Your water breaks before 37 weeks.  You have regular contractions less than 5 minutes apart before 37 weeks.  You have a fever.  You are leaking fluid from your vagina.  You have spotting or bleeding from your vagina.  You have severe abdominal pain or cramping.  You have rapid weight loss or weight gain.  You have   shortness of breath with chest pain.  You notice sudden or extreme swelling of your face, hands, ankles, feet, or legs.  Your baby makes fewer than 10 movements in 2 hours.  You have severe headaches that do not go away when you take medicine.  You have vision changes. Summary  The third trimester is from week 28 through week 40, months 7 through 9. The third trimester is a time when the unborn baby (fetus) is growing rapidly.  During the third trimester, your discomfort may increase as you and your baby continue to gain weight. You may have abdominal, leg, and back pain, sleeping problems, and an increased need to urinate.  During the third trimester your breasts will keep growing and they will continue to become tender. A yellow fluid (colostrum) may leak from your breasts. This is the first milk you are producing for your baby.  False labor is a condition in which you feel small, irregular tightenings of the muscles in the womb (contractions) that eventually go away. These are called Braxton Hicks contractions. Contractions may last for hours, days, or even weeks before true labor sets in.  Signs of labor can include: abdominal cramps; regular contractions that start at 10 minutes apart and become stronger and more frequent with time; watery or bloody mucus discharge that comes from the vagina; increased pelvic pressure and dull back pain; and leaking of amniotic fluid. This information is not intended to replace advice given to you by your  health care provider. Make sure you discuss any questions you have with your health care provider. Document Revised: 12/28/2018 Document Reviewed: 10/12/2016 Elsevier Patient Education  2020 [employer]  

## 2020-03-08 LAB — 28 WEEK RH+PANEL
Basophils Absolute: 0 10*3/uL (ref 0.0–0.2)
Basos: 0 %
EOS (ABSOLUTE): 0.1 10*3/uL (ref 0.0–0.4)
Eos: 1 %
Gestational Diabetes Screen: 132 mg/dL (ref 65–139)
HIV Screen 4th Generation wRfx: NONREACTIVE
Hematocrit: 39.3 % (ref 34.0–46.6)
Hemoglobin: 13.4 g/dL (ref 11.1–15.9)
Immature Grans (Abs): 0.1 10*3/uL (ref 0.0–0.1)
Immature Granulocytes: 1 %
Lymphocytes Absolute: 1.9 10*3/uL (ref 0.7–3.1)
Lymphs: 15 %
MCH: 31.2 pg (ref 26.6–33.0)
MCHC: 34.1 g/dL (ref 31.5–35.7)
MCV: 92 fL (ref 79–97)
Monocytes Absolute: 0.4 10*3/uL (ref 0.1–0.9)
Monocytes: 3 %
Neutrophils Absolute: 10.3 10*3/uL — ABNORMAL HIGH (ref 1.4–7.0)
Neutrophils: 80 %
Platelets: 297 10*3/uL (ref 150–450)
RBC: 4.29 x10E6/uL (ref 3.77–5.28)
RDW: 12.3 % (ref 11.7–15.4)
RPR Ser Ql: NONREACTIVE
WBC: 12.8 10*3/uL — ABNORMAL HIGH (ref 3.4–10.8)

## 2020-03-10 ENCOUNTER — Ambulatory Visit
Admission: RE | Admit: 2020-03-10 | Discharge: 2020-03-10 | Disposition: A | Discharge status: Home / Self Care | Payer: Medicaid Other | Source: Ambulatory Visit | Attending: Obstetrics | Admitting: Obstetrics

## 2020-03-10 ENCOUNTER — Ambulatory Visit: Payer: Medicaid Other

## 2020-03-10 ENCOUNTER — Other Ambulatory Visit: Payer: Self-pay

## 2020-03-10 DIAGNOSIS — Z6837 Body mass index (BMI) 37.0-37.9, adult: Secondary | ICD-10-CM

## 2020-03-10 DIAGNOSIS — O99212 Obesity complicating pregnancy, second trimester: Secondary | ICD-10-CM | POA: Diagnosis not present

## 2020-03-10 DIAGNOSIS — E669 Obesity, unspecified: Secondary | ICD-10-CM | POA: Diagnosis not present

## 2020-03-10 DIAGNOSIS — O321XX0 Maternal care for breech presentation, not applicable or unspecified: Secondary | ICD-10-CM | POA: Diagnosis not present

## 2020-03-10 DIAGNOSIS — O0991 Supervision of high risk pregnancy, unspecified, first trimester: Secondary | ICD-10-CM

## 2020-03-10 DIAGNOSIS — Z3A27 27 weeks gestation of pregnancy: Secondary | ICD-10-CM | POA: Insufficient documentation

## 2020-03-20 DIAGNOSIS — Z419 Encounter for procedure for purposes other than remedying health state, unspecified: Secondary | ICD-10-CM | POA: Diagnosis not present

## 2020-03-21 ENCOUNTER — Other Ambulatory Visit: Payer: Self-pay

## 2020-03-21 ENCOUNTER — Ambulatory Visit (INDEPENDENT_AMBULATORY_CARE_PROVIDER_SITE_OTHER): Payer: Medicaid Other | Admitting: Obstetrics

## 2020-03-21 VITALS — BP 120/70 | Wt 276.0 lb

## 2020-03-21 DIAGNOSIS — O99212 Obesity complicating pregnancy, second trimester: Secondary | ICD-10-CM

## 2020-03-21 DIAGNOSIS — O0992 Supervision of high risk pregnancy, unspecified, second trimester: Secondary | ICD-10-CM

## 2020-03-21 DIAGNOSIS — O99213 Obesity complicating pregnancy, third trimester: Secondary | ICD-10-CM

## 2020-03-21 DIAGNOSIS — Z3A28 28 weeks gestation of pregnancy: Secondary | ICD-10-CM

## 2020-03-21 DIAGNOSIS — O0993 Supervision of high risk pregnancy, unspecified, third trimester: Secondary | ICD-10-CM

## 2020-03-21 LAB — POCT URINALYSIS DIPSTICK OB
Glucose, UA: NEGATIVE
POC,PROTEIN,UA: NEGATIVE

## 2020-03-21 NOTE — Progress Notes (Signed)
ROB at [redacted]w[redacted]d. Denies problems or danger sxs. Has had several [place] appts - tech unable to visualze all the cardiac anatomy. HX of previous CS, desires TOL RH negative- received Rhogam Measures large for dates- growth scans monthly recommended. RTC in 2 weeks for ROB and sono. [name], CNM  03/21/2020 10:01 AM

## 2020-03-21 NOTE — Progress Notes (Signed)
ROB- no concerns 

## 2020-04-04 ENCOUNTER — Ambulatory Visit (INDEPENDENT_AMBULATORY_CARE_PROVIDER_SITE_OTHER): Payer: Medicaid Other

## 2020-04-04 ENCOUNTER — Other Ambulatory Visit: Payer: Self-pay

## 2020-04-04 ENCOUNTER — Ambulatory Visit (INDEPENDENT_AMBULATORY_CARE_PROVIDER_SITE_OTHER): Payer: Medicaid Other | Admitting: Obstetrics & Gynecology

## 2020-04-04 ENCOUNTER — Encounter: Payer: Self-pay | Admitting: Obstetrics & Gynecology

## 2020-04-04 VITALS — BP 120/80 | Wt 279.0 lb

## 2020-04-04 DIAGNOSIS — Z23 Encounter for immunization: Secondary | ICD-10-CM

## 2020-04-04 DIAGNOSIS — Z3A28 28 weeks gestation of pregnancy: Secondary | ICD-10-CM

## 2020-04-04 DIAGNOSIS — O99213 Obesity complicating pregnancy, third trimester: Secondary | ICD-10-CM

## 2020-04-04 DIAGNOSIS — O0993 Supervision of high risk pregnancy, unspecified, third trimester: Secondary | ICD-10-CM

## 2020-04-04 DIAGNOSIS — Z3A30 30 weeks gestation of pregnancy: Secondary | ICD-10-CM

## 2020-04-04 DIAGNOSIS — O0992 Supervision of high risk pregnancy, unspecified, second trimester: Secondary | ICD-10-CM

## 2020-04-04 LAB — POCT URINALYSIS DIPSTICK OB
Glucose, UA: NEGATIVE
POC,PROTEIN,UA: NEGATIVE

## 2020-04-04 NOTE — Patient Instructions (Signed)
Third Trimester of Pregnancy The third trimester is from week 28 through week 40 (months 7 through 9). The third trimester is a time when the unborn baby (fetus) is growing rapidly. At the end of the ninth month, the fetus is about 20 inches in length and weighs 6-10 pounds. Body changes during your third trimester Your body will continue to go through many changes during pregnancy. The changes vary from woman to woman. During the third trimester:  Your weight will continue to increase. You can expect to gain 25-35 pounds (11-16 kg) by the end of the pregnancy.  You may begin to get stretch marks on your hips, abdomen, and breasts.  You may urinate more often because the fetus is moving lower into your pelvis and pressing on your bladder.  You may develop or continue to have heartburn. This is caused by increased hormones that slow down muscles in the digestive tract.  You may develop or continue to have constipation because increased hormones slow digestion and cause the muscles that push waste through your intestines to relax.  You may develop hemorrhoids. These are swollen veins (varicose veins) in the rectum that can itch or be painful.  You may develop swollen, bulging veins (varicose veins) in your legs.  You may have increased body aches in the pelvis, back, or thighs. This is due to weight gain and increased hormones that are relaxing your joints.  You may have changes in your hair. These can include thickening of your hair, rapid growth, and changes in texture. Some women also have hair loss during or after pregnancy, or hair that feels dry or thin. Your hair will most likely return to normal after your baby is born.  Your breasts will continue to grow and they will continue to become tender. A yellow fluid (colostrum) may leak from your breasts. This is the first milk you are producing for your baby.  Your belly button may stick out.  You may notice more swelling in your hands,  face, or ankles.  You may have increased tingling or numbness in your hands, arms, and legs. The skin on your belly may also feel numb.  You may feel short of breath because of your expanding uterus.  You may have more problems sleeping. This can be caused by the size of your belly, increased need to urinate, and an increase in your body's metabolism.  You may notice the fetus "dropping," or moving lower in your abdomen (lightening).  You may have increased vaginal discharge.  You may notice your joints feel loose and you may have pain around your pelvic bone. What to expect at prenatal visits You will have prenatal exams every 2 weeks until week 36. Then you will have weekly prenatal exams. During a routine prenatal visit:  You will be weighed to make sure you and the baby are growing normally.  Your blood pressure will be taken.  Your abdomen will be measured to track your baby's growth.  The fetal heartbeat will be listened to.  Any test results from the previous visit will be discussed.  You may have a cervical check near your due date to see if your cervix has softened or thinned (effaced).  You will be tested for Group B streptococcus. This happens between 35 and 37 weeks. Your health care provider may ask you:  What your birth plan is.  How you are feeling.  If you are feeling the baby move.  If you have had any abnormal   symptoms, such as leaking fluid, bleeding, severe headaches, or abdominal cramping.  If you are using any tobacco products, including cigarettes, chewing tobacco, and electronic cigarettes.  If you have any questions. Other tests or screenings that may be performed during your third trimester include:  Blood tests that check for low iron levels (anemia).  Fetal testing to check the health, activity level, and growth of the fetus. Testing is done if you have certain medical conditions or if there are problems during the pregnancy.  Nonstress test  (NST). This test checks the health of your baby to make sure there are no signs of problems, such as the baby not getting enough oxygen. During this test, a belt is placed around your belly. The baby is made to move, and its heart rate is monitored during movement. What is false labor? False labor is a condition in which you feel small, irregular tightenings of the muscles in the womb (contractions) that usually go away with rest, changing position, or drinking water. These are called Braxton Hicks contractions. Contractions may last for hours, days, or even weeks before true labor sets in. If contractions come at regular intervals, become more frequent, increase in intensity, or become painful, you should see your health care provider. What are the signs of labor?  Abdominal cramps.  Regular contractions that start at 10 minutes apart and become stronger and more frequent with time.  Contractions that start on the top of the uterus and spread down to the lower abdomen and back.  Increased pelvic pressure and dull back pain.  A watery or bloody mucus discharge that comes from the vagina.  Leaking of amniotic fluid. This is also known as your "water breaking." It could be a slow trickle or a gush. Let your health care provider know if it has a color or strange odor. If you have any of these signs, call your health care provider right away, even if it is before your due date. Follow these instructions at home: Medicines  Follow your health care provider's instructions regarding medicine use. Specific medicines may be either safe or unsafe to take during pregnancy.  Take a prenatal vitamin that contains at least 600 micrograms (mcg) of folic acid.  If you develop constipation, try taking a stool softener if your health care provider approves. Eating and drinking   Eat a balanced diet that includes fresh fruits and vegetables, whole grains, good sources of protein such as meat, eggs, or tofu,  and low-fat dairy. Your health care provider will help you determine the amount of weight gain that is right for you.  Avoid raw meat and uncooked cheese. These carry germs that can cause birth defects in the baby.  If you have low calcium intake from food, talk to your health care provider about whether you should take a daily calcium supplement.  Eat four or five small meals rather than three large meals a day.  Limit foods that are high in fat and processed sugars, such as fried and sweet foods.  To prevent constipation: ? Drink enough fluid to keep your urine clear or pale yellow. ? Eat foods that are high in fiber, such as fresh fruits and vegetables, whole grains, and beans. Activity  Exercise only as directed by your health care provider. Most women can continue their usual exercise routine during pregnancy. Try to exercise for 30 minutes at least 5 days a week. Stop exercising if you experience uterine contractions.  Avoid heavy lifting.  Do   not exercise in extreme heat or humidity, or at high altitudes.  Wear low-heel, comfortable shoes.  Practice good posture.  You may continue to have sex unless your health care provider tells you otherwise. Relieving pain and discomfort  Take frequent breaks and rest with your legs elevated if you have leg cramps or low back pain.  Take warm sitz baths to soothe any pain or discomfort caused by hemorrhoids. Use hemorrhoid cream if your health care provider approves.  Wear a good support bra to prevent discomfort from breast tenderness.  If you develop varicose veins: ? Wear support pantyhose or compression stockings as told by your healthcare provider. ? Elevate your feet for 15 minutes, 3-4 times a day. Prenatal care  Write down your questions. Take them to your prenatal visits.  Keep all your prenatal visits as told by your health care provider. This is important. Safety  Wear your seat belt at all times when driving.  Make  a list of emergency phone numbers, including numbers for family, friends, the hospital, and police and fire departments. General instructions  Avoid cat litter boxes and soil used by cats. These carry germs that can cause birth defects in the baby. If you have a cat, ask someone to clean the litter box for you.  Do not travel far distances unless it is absolutely necessary and only with the approval of your health care provider.  Do not use hot tubs, steam rooms, or saunas.  Do not drink alcohol.  Do not use any products that contain nicotine or tobacco, such as cigarettes and e-cigarettes. If you need help quitting, ask your health care provider.  Do not use any medicinal herbs or unprescribed drugs. These chemicals affect the formation and growth of the baby.  Do not douche or use tampons or scented sanitary pads.  Do not cross your legs for long periods of time.  To prepare for the arrival of your baby: ? Take prenatal classes to understand, practice, and ask questions about labor and delivery. ? Make a trial run to the hospital. ? Visit the hospital and tour the maternity area. ? Arrange for maternity or paternity leave through employers. ? Arrange for family and friends to take care of pets while you are in the hospital. ? Purchase a rear-facing car seat and make sure you know how to install it in your car. ? Pack your hospital bag. ? Prepare the baby's nursery. Make sure to remove all pillows and stuffed animals from the baby's crib to prevent suffocation.  Visit your dentist if you have not gone during your pregnancy. Use a soft toothbrush to brush your teeth and be gentle when you floss. Contact a health care provider if:  You are unsure if you are in labor or if your water has broken.  You become dizzy.  You have mild pelvic cramps, pelvic pressure, or nagging pain in your abdominal area.  You have lower back pain.  You have persistent nausea, vomiting, or  diarrhea.  You have an unusual or bad smelling vaginal discharge.  You have pain when you urinate. Get help right away if:  Your water breaks before 37 weeks.  You have regular contractions less than 5 minutes apart before 37 weeks.  You have a fever.  You are leaking fluid from your vagina.  You have spotting or bleeding from your vagina.  You have severe abdominal pain or cramping.  You have rapid weight loss or weight gain.  You have   shortness of breath with chest pain.  You notice sudden or extreme swelling of your face, hands, ankles, feet, or legs.  Your baby makes fewer than 10 movements in 2 hours.  You have severe headaches that do not go away when you take medicine.  You have vision changes. Summary  The third trimester is from week 28 through week 40, months 7 through 9. The third trimester is a time when the unborn baby (fetus) is growing rapidly.  During the third trimester, your discomfort may increase as you and your baby continue to gain weight. You may have abdominal, leg, and back pain, sleeping problems, and an increased need to urinate.  During the third trimester your breasts will keep growing and they will continue to become tender. A yellow fluid (colostrum) may leak from your breasts. This is the first milk you are producing for your baby.  False labor is a condition in which you feel small, irregular tightenings of the muscles in the womb (contractions) that eventually go away. These are called Braxton Hicks contractions. Contractions may last for hours, days, or even weeks before true labor sets in.  Signs of labor can include: abdominal cramps; regular contractions that start at 10 minutes apart and become stronger and more frequent with time; watery or bloody mucus discharge that comes from the vagina; increased pelvic pressure and dull back pain; and leaking of amniotic fluid. This information is not intended to replace advice given to you by your  health care provider. Make sure you discuss any questions you have with your health care provider. Document Revised: 12/28/2018 Document Reviewed: 10/12/2016 Elsevier Patient Education  2020 [employer]  

## 2020-04-04 NOTE — Progress Notes (Signed)
  Subjective  Fetal Movement? yes Contractions? no Leaking Fluid? no Vaginal Bleeding? no  Objective  BP 120/80   Wt 279 lb (126.6 kg)   LMP 09/01/2019 (Exact Date)   BMI 40.03 kg/m  General: NAD Pumonary: no increased work of breathing Abdomen: gravid, non-tender Extremities: no edema Psychiatric: mood appropriate, affect full  Assessment  28 y.o. G3P1011 at [redacted]w[redacted]d by  06/07/2020, by Last Menstrual Period presenting for routine prenatal visit  Plan   Problem List Items Addressed This Visit      Other   Obesity affecting pregnancy    Other Visit Diagnoses    [redacted] weeks gestation of pregnancy    -  Primary   Relevant Orders   POC Urinalysis Dipstick OB (Completed)   Supervision of high risk pregnancy in third trimester       Need for Tdap vaccination       Relevant Orders   POC Urinalysis Dipstick OB (Completed)      Pregnancy#3 Problems (from 09/01/19 to present)    Problem Noted Resolved   BMI 37.0-37.9, adult 01/23/2020 by Conard Novak, MD No   Supervision of high risk pregnancy in first trimester 11/14/2019 by Natale Milch, MD No   Overview Addendum 03/12/2020  5:50 PM by Natale Milch, MD     Nursing Staff Provider  Office Location  [facility] Dating   LMP=7wk [place]  Language  English Anatomy US  Incomplete, suboptimal scan with MFM, close exam after birth advised  Flu Vaccine   Genetic Screen  NIPS:normal XX  TDaP vaccine   04/04/20 today Hgb A1C or  GTT Early :102 Third trimester :   Rhogam  03/07/20   LAB RESULTS   Feeding Plan  breast Blood Type A/Negative/-- (02/24 1026)   Contraception  unsure Antibody Negative (02/24 1026)  Circumcision   Rubella 7.10 (02/24 1026)  Pediatrician   RPR Non Reactive (02/24 1026)   Support Person  HBsAg Negative (02/24 1026)   Prenatal Classes  HIV Non Reactive (02/24 1026)    Varicella immune  BTL Consent n/a GBS  (For PCN allergy, check sensitivities)        VBAC Consent Desires TOLAC Pap  06/2017 NIL     Hgb Electro      CF      SMA               Previous Version   Obesity affecting pregnancy 07/04/2012 by Delbert Phenix, NP No   Overview Signed 10/11/2018  5:36 PM by Kerman Passey, MD    Referred to nutritionist -- did not attend Jan 2020        Review of ULTRASOUND.    I have personally reviewed images and report of recent ultrasound done at [facility].    Plan of management to be discussed with patient.    Growth normal    Breech   Annamarie Major, MD, [facility], [facility] 04/04/2020  10:47 AM

## 2020-04-18 ENCOUNTER — Other Ambulatory Visit: Payer: Self-pay

## 2020-04-18 ENCOUNTER — Ambulatory Visit (INDEPENDENT_AMBULATORY_CARE_PROVIDER_SITE_OTHER): Payer: Medicaid Other | Admitting: Obstetrics

## 2020-04-18 VITALS — BP 110/80 | Wt 281.0 lb

## 2020-04-18 DIAGNOSIS — Z3A32 32 weeks gestation of pregnancy: Secondary | ICD-10-CM

## 2020-04-18 DIAGNOSIS — O0991 Supervision of high risk pregnancy, unspecified, first trimester: Secondary | ICD-10-CM

## 2020-04-18 DIAGNOSIS — O0993 Supervision of high risk pregnancy, unspecified, third trimester: Secondary | ICD-10-CM

## 2020-04-18 DIAGNOSIS — O99213 Obesity complicating pregnancy, third trimester: Secondary | ICD-10-CM

## 2020-04-18 LAB — POCT URINALYSIS DIPSTICK OB
Glucose, UA: NEGATIVE
POC,PROTEIN,UA: NEGATIVE

## 2020-04-18 NOTE — Progress Notes (Signed)
Routine Prenatal Care Visit  Subjective  Emily Payne is a 29 y.o. G3P1011 at [redacted]w[redacted]d being seen today for ongoing prenatal care.  She is currently monitored for the following issues for this high-risk pregnancy and has Obesity affecting pregnancy; Dandruff; Depression; Generalized anxiety disorder with panic attacks; S/P laparoscopic surgery; Supervision of high risk pregnancy in first trimester; and BMI 37.0-37.9, adult on their problem list.  ----------------------------------------------------------------------------------- Patient reports no complaints.  She verbalizes a strong desire for TOLAC and would like help turning the baby should it remain breech.  .  .   Pincus Large Fluid denies.  ----------------------------------------------------------------------------------- The following portions of the patient's history were reviewed and updated as appropriate: allergies, current medications, past family history, past medical history, past social history, past surgical history and problem list. Problem list updated.  Objective  Blood pressure 110/80, weight (!) 281 lb (127.5 kg), last menstrual period 09/01/2019. Pregravid weight 258 lb (117 kg) Total Weight Gain 23 lb (10.4 kg) Urinalysis: Urine Protein Negative  Urine Glucose Negative  Fetal Status:           General:  Alert, oriented and cooperative. Patient is in no acute distress.  Skin: Skin is warm and dry. No rash noted.   Cardiovascular: Normal heart rate noted  Respiratory: Normal respiratory effort, no problems with respiration noted  Abdomen: Soft, gravid, appropriate for gestational age.       Pelvic:  Cervical exam deferred        Extremities: Normal range of motion.     Mental Status: Normal mood and affect. Normal behavior. Normal judgment and thought content.   Assessment   29 y.o. G3P1011 at [redacted]w[redacted]d by  06/07/2020, by Last Menstrual Period presenting for routine prenatal visit  Plan   Pregnancy#3 Problems (from  09/01/19 to present)    Problem Noted Resolved   BMI 37.0-37.9, adult 01/23/2020 by Conard Novak, MD No   Supervision of high risk pregnancy in first trimester 11/14/2019 by Natale Milch, MD No   Overview Addendum 04/18/2020 10:45 AM by [name], CNM     Nursing Staff Provider  Office Location  [facility] Dating   LMP=7wk [place]  Language  English Anatomy US  Incomplete, suboptimal scan with MFM, close exam after birth advised  Flu Vaccine   Genetic Screen  NIPS:normal XX  TDaP vaccine   given 7/16 Hgb A1C or  GTT Early :102 Third trimester :   Rhogam  03/07/20   LAB RESULTS   Feeding Plan  breast Blood Type A/Negative/-- (02/24 1026)   Contraception  Antibody Negative (02/24 1026)  Circumcision  Rubella 7.10 (02/24 1026)  Pediatrician   RPR Non Reactive (02/24 1026)   Support Person  HBsAg Negative (02/24 1026)   Prenatal Classes  HIV Non Reactive (02/24 1026)    Varicella immune  BTL Consent  GBS  (For PCN allergy, check sensitivities)        VBAC Consent Desires TOLAC Pap  06/2017 NIL    Hgb Electro      CF      SMA               Previous Version   Obesity affecting pregnancy 07/04/2012 by Delbert Phenix, NP No   Overview Signed 10/11/2018  5:36 PM by Kerman Passey, MD    Referred to nutritionist -- did not attend Jan 2020          Preterm labor symptoms and general obstetric precautions including but not  limited to vaginal bleeding, contractions, leaking of fluid and fetal movement were reviewed in detail with the patient. Please refer to After Visit Summary for other counseling recommendations.  Growth scan ordered for next visit. We discussed the spinning babies information. Encouraged her to read about breech til exercises.  Return in about 2 weeks (around 05/02/2020) for return OB with [facility].  [name], CNM  04/18/2020 10:54 AM

## 2020-04-18 NOTE — Progress Notes (Signed)
ROB- no concerns 

## 2020-04-20 DIAGNOSIS — Z419 Encounter for procedure for purposes other than remedying health state, unspecified: Secondary | ICD-10-CM | POA: Diagnosis not present

## 2020-05-02 ENCOUNTER — Ambulatory Visit: Payer: Medicaid Other

## 2020-05-02 ENCOUNTER — Encounter: Payer: Medicaid Other | Admitting: Obstetrics and Gynecology

## 2020-05-07 ENCOUNTER — Ambulatory Visit (INDEPENDENT_AMBULATORY_CARE_PROVIDER_SITE_OTHER): Payer: Medicaid Other | Admitting: Obstetrics and Gynecology

## 2020-05-07 ENCOUNTER — Ambulatory Visit (INDEPENDENT_AMBULATORY_CARE_PROVIDER_SITE_OTHER): Payer: Medicaid Other

## 2020-05-07 ENCOUNTER — Other Ambulatory Visit: Payer: Self-pay

## 2020-05-07 VITALS — BP 118/70 | Ht 70.0 in | Wt 290.8 lb

## 2020-05-07 DIAGNOSIS — Z3A35 35 weeks gestation of pregnancy: Secondary | ICD-10-CM

## 2020-05-07 DIAGNOSIS — Z3A32 32 weeks gestation of pregnancy: Secondary | ICD-10-CM | POA: Diagnosis not present

## 2020-05-07 DIAGNOSIS — O0993 Supervision of high risk pregnancy, unspecified, third trimester: Secondary | ICD-10-CM

## 2020-05-07 DIAGNOSIS — O99213 Obesity complicating pregnancy, third trimester: Secondary | ICD-10-CM | POA: Diagnosis not present

## 2020-05-07 LAB — POCT URINALYSIS DIPSTICK OB
Glucose, UA: NEGATIVE
POC,PROTEIN,UA: NEGATIVE

## 2020-05-07 NOTE — Progress Notes (Signed)
Routine Prenatal Care Visit  Subjective  Emily Payne is a 29 y.o. G3P1011 at [redacted]w[redacted]d being seen today for ongoing prenatal care.  She is currently monitored for the following issues for this low-risk pregnancy and has Obesity affecting pregnancy; Dandruff; Depression; Generalized anxiety disorder with panic attacks; S/P laparoscopic surgery; Supervision of high risk pregnancy in first trimester; and BMI 37.0-37.9, adult on their problem list.  ----------------------------------------------------------------------------------- Patient reports no complaints.   Contractions: Irregular. Vag. Bleeding: None.  Movement: Present. Denies leaking of fluid.  ----------------------------------------------------------------------------------- The following portions of the patient's history were reviewed and updated as appropriate: allergies, current medications, past family history, past medical history, past social history, past surgical history and problem list. Problem list updated.   Objective  Blood pressure 118/70, height 5\' 10"  (1.778 m), weight 290 lb 12.8 oz (131.9 kg), last menstrual period 09/01/2019. Pregravid weight 258 lb (117 kg) Total Weight Gain 32 lb 12.8 oz (14.9 kg) Urinalysis:      Fetal Status: Fetal Heart Rate (bpm): 145 Fundal Height: 37 cm Movement: Present  Presentation: Vertex  General:  Alert, oriented and cooperative. Patient is in no acute distress.  Skin: Skin is warm and dry. No rash noted.   Cardiovascular: Normal heart rate noted  Respiratory: Normal respiratory effort, no problems with respiration noted  Abdomen: Soft, gravid, appropriate for gestational age. Pain/Pressure: Absent     Pelvic:  Cervical exam deferred        Extremities: Normal range of motion.     Mental Status: Normal mood and affect. Normal behavior. Normal judgment and thought content.     Assessment   29 y.o. G3P1011 at [redacted]w[redacted]d by  06/07/2020, by Last Menstrual Period presenting for routine  prenatal visit  Plan   Pregnancy#3 Problems (from 09/01/19 to present)    Problem Noted Resolved   BMI 37.0-37.9, adult 01/23/2020 by 03/24/2020, MD No   Supervision of high risk pregnancy in first trimester 11/14/2019 by 11/16/2019, MD No   Overview Addendum 04/18/2020 10:45 AM by 04/20/2020, CNM     Nursing Staff Provider  Office Location  [facility] Dating   LMP=7wk [name]  Language  English Anatomy US  Incomplete, suboptimal scan with MFM, close exam after birth advised  Flu Vaccine   Genetic Screen  NIPS:normal XX  TDaP vaccine   given 7/16 Hgb A1C or  GTT Early :102 Third trimester :   Rhogam  03/07/20   LAB RESULTS   Feeding Plan  breast Blood Type A/Negative/-- (02/24 1026)   Contraception  Antibody Negative (02/24 1026)  Circumcision  Rubella 7.10 (02/24 1026)  Pediatrician   RPR Non Reactive (02/24 1026)   Support Person  HBsAg Negative (02/24 1026)   Prenatal Classes  HIV Non Reactive (02/24 1026)    Varicella immune  BTL Consent  GBS  (For PCN allergy, check sensitivities)        VBAC Consent Desires TOLAC Pap  06/2017 NIL    Hgb Electro      CF      SMA               Previous Version   Obesity affecting pregnancy 07/04/2012 by 07/06/2012, NP No   Overview Signed 10/11/2018  5:36 PM by 10/13/2018, MD    Referred to nutritionist -- did not attend Jan 2020          28 y.o. Feb 2020 at [redacted]w[redacted]d with Estimated Date of Delivery:  06/07/20 was seen today in office to discuss trial of labor after cesarean section (TOLAC) versus elective repeat cesarean delivery (ERCD). The following risks were discussed with the patient.  Risk of uterine rupture at term is 0.78 percent with TOLAC and 0.22 percent with ERCD. 1 in 10 uterine ruptures will result in neonatal death or neurological injury. The benefits of a trial of labor after cesarean (TOLAC) resulting in a vaginal birth after cesarean (VBAC) include the following: shorter length of hospital stay  and postpartum recovery (in most cases); fewer complications, such as postpartum fever, wound or uterine infection, thromboembolism (blood clots in the leg or lung), need for blood transfusion and fewer neonatal breathing problems. The risks of an attempted VBAC or TOLAC include the following:  Risk of failed trial of labor after cesarean (TOLAC) without a vaginal birth after cesarean (VBAC) resulting in repeat cesarean delivery (RCD) in about 20 to 40 percent of women who attempt VBAC.  Her individualized success rate using the MFMU VBAC risk calculator is 54.7%.    Risk of rupture of uterus resulting in an emergency cesarean delivery. The risk of uterine rupture may be related in part to the type of uterine incision made during the first cesarean delivery. A previous transverse uterine incision has the lowest risk of rupture (0.2 to 1.5 percent risk). Vertical or T-shaped uterine incisions have a higher risk of uterine rupture (4 to 9 percent risk)The risk of fetal death is very low with both VBAC and elective repeat cesarean delivery (ERCD), but the likelihood of fetal death is higher with VBAC than with ERCD. Maternal death is very rare with either type of delivery. The risks of an elective repeat cesarean delivery (ERCD) were reviewed with the patient including but not limited to: 10/998 risk of uterine rupture which could have serious consequences, bleeding which may require transfusion; infection which may require antibiotics; injury to bowel, bladder or other surrounding organs (bowel, bladder, ureters); injury to the fetus; need for additional procedures including hysterectomy in the event of a life-threatening hemorrhage; thromboembolic phenomenon; abnormal placentation; incisional problems; death and other postoperative or anesthesia complications.    In addition we discussed that our collective office practice is to allow patient's who desire to attempt TOLAC to go into labor naturally.  There is  some limited data that rupture rate may increase past [redacted] weeks gestation, but it is reasonable for women who are strongly committed to [facility] to continue pregnancy into the 41st week.  Medical indications necessetating early delivery may arise during the course of any pregnancy.  Given the contraindication on the use of prostaglandins for use in cervical ripening,  recommendation would be to proceed with repeat cesarean for delivery for patient's with unfavorable cervix (low Bishops score) who reach 41 weeks or who otherwise have a medical indication for early delivery.   These risks and benefits are summarized on the consent form, which was reviewed with the patient during the visit.  All her questions answered and she signed a consent indicating a preference for TOLAC/ERCD. A copy of the consent was given to the patient.  Desires VBAC  Gestational age appropriate obstetric precautions including but not limited to vaginal bleeding, contractions, leaking of fluid and fetal movement were reviewed in detail with the patient.    Return in about 1 week (around 05/14/2020) for ROB in perosn- shcuman okay to double book.  Natale Milch MD [facility], [facility] 05/07/2020, 11:31 AM

## 2020-05-07 NOTE — Patient Instructions (Signed)
Predicted chance of vaginal birth after cesarean: 54.7% 95% confidence interval: 49.3%, 60.0%   29 y.o. G3P1011 at [redacted]w[redacted]d with Estimated Date of Delivery: 06/07/20 was seen today in office to discuss trial of labor after cesarean section (TOLAC) versus elective repeat cesarean delivery (ERCD). The following risks were discussed with the patient.  Risk of uterine rupture at term is 0.78 percent with TOLAC and 0.22 percent with ERCD. 1 in 10 uterine ruptures will result in neonatal death or neurological injury. The benefits of a trial of labor after cesarean (TOLAC) resulting in a vaginal birth after cesarean (VBAC) include the following: shorter length of hospital stay and postpartum recovery (in most cases); fewer complications, such as postpartum fever, wound or uterine infection, thromboembolism (blood clots in the leg or lung), need for blood transfusion and fewer neonatal breathing problems. The risks of an attempted VBAC or TOLAC include the following: . Risk of failed trial of labor after cesarean (TOLAC) without a vaginal birth after cesarean (VBAC) resulting in repeat cesarean delivery (RCD) in about 20 to 40 percent of women who attempt VBAC.  Her individualized success rate using the [facility] VBAC risk calculator is 54.7%.   . Risk of rupture of uterus resulting in an emergency cesarean delivery. The risk of uterine rupture may be related in part to the type of uterine incision made during the first cesarean delivery. A previous transverse uterine incision has the lowest risk of rupture (0.2 to 1.5 percent risk). Vertical or T-shaped uterine incisions have a higher risk of uterine rupture (4 to 9 percent risk)The risk of fetal death is very low with both VBAC and elective repeat cesarean delivery (ERCD), but the likelihood of fetal death is higher with VBAC than with ERCD. Maternal death is very rare with either type of delivery. The risks of an elective repeat cesarean delivery (ERCD) were reviewed  with the patient including but not limited to: 10/998 risk of uterine rupture which could have serious consequences, bleeding which may require transfusion; infection which may require antibiotics; injury to bowel, bladder or other surrounding organs (bowel, bladder, ureters); injury to the fetus; need for additional procedures including hysterectomy in the event of a life-threatening hemorrhage; thromboembolic phenomenon; abnormal placentation; incisional problems; death and other postoperative or anesthesia complications.    In addition we discussed that our collective office practice is to allow patient's who desire to attempt TOLAC to go into labor naturally.  There is some limited data that rupture rate may increase past [redacted] weeks gestation, but it is reasonable for women who are strongly committed to [facility] to continue pregnancy into the 41st week.  Medical indications necessetating early delivery may arise during the course of any pregnancy.  Given the contraindication on the use of prostaglandins for use in cervical ripening,  recommendation would be to proceed with repeat cesarean for delivery for patient's with unfavorable cervix (low Bishops score) who reach 41 weeks or who otherwise have a medical indication for early delivery.   These risks and benefits are summarized on the consent form, which was reviewed with the patient during the visit.  All her questions answered and she signed a consent indicating a preference for TOLAC/ERCD. A copy of the consent was given to the patient.     Vaginal Delivery  Vaginal delivery means that you give birth by pushing your baby out of your birth canal (vagina). A team of health care providers will help you before, during, and after vaginal delivery. Birth experiences are unique  for every woman and every pregnancy, and birth experiences vary depending on where you choose to give birth. What happens when I arrive at the birth center or hospital? Once you are  in labor and have been admitted into the hospital or birth center, your health care provider may:  Review your pregnancy history and any concerns that you have.  Insert an IV into one of your veins. This may be used to give you fluids and medicines.  Check your blood pressure, pulse, temperature, and heart rate (vital signs).  Check whether your bag of water (amniotic sac) has broken (ruptured).  Talk with you about your birth plan and discuss pain control options. Monitoring Your health care provider may monitor your contractions (uterine monitoring) and your baby's heart rate (fetal monitoring). You may need to be monitored:  Often, but not continuously (intermittently).  All the time or for long periods at a time (continuously). Continuous monitoring may be needed if: ? You are taking certain medicines, such as medicine to relieve pain or make your contractions stronger. ? You have pregnancy or labor complications. Monitoring may be done by:  Placing a special stethoscope or a handheld monitoring device on your abdomen to check your baby's heartbeat and to check for contractions.  Placing monitors on your abdomen (external monitors) to record your baby's heartbeat and the frequency and length of contractions.  Placing monitors inside your uterus through your vagina (internal monitors) to record your baby's heartbeat and the frequency, length, and strength of your contractions. Depending on the type of monitor, it may remain in your uterus or on your baby's head until birth.  Telemetry. This is a type of continuous monitoring that can be done with external or internal monitors. Instead of having to stay in bed, you are able to move around during telemetry. Physical exam Your health care provider may perform frequent physical exams. This may include:  Checking how and where your baby is positioned in your uterus.  Checking your cervix to determine: ? Whether it is thinning out  (effacing). ? Whether it is opening up (dilating). What happens during labor and delivery?  Normal labor and delivery is divided into the following three stages: Stage 1  This is the longest stage of labor.  This stage can last for hours or days.  Throughout this stage, you will feel contractions. Contractions generally feel mild, infrequent, and irregular at first. They get stronger, more frequent (about every 2-3 minutes), and more regular as you move through this stage.  This stage ends when your cervix is completely dilated to 4 inches (10 cm) and completely effaced. Stage 2  This stage starts once your cervix is completely effaced and dilated and lasts until the delivery of your baby.  This stage may last from 20 minutes to 2 hours.  This is the stage where you will feel an urge to push your baby out of your vagina.  You may feel stretching and burning pain, especially when the widest part of your baby's head passes through the vaginal opening (crowning).  Once your baby is delivered, the umbilical cord will be clamped and cut. This usually occurs after waiting a period of 1-2 minutes after delivery.  Your baby will be placed on your bare chest (skin-to-skin contact) in an upright position and covered with a warm blanket. Watch your baby for feeding cues, like rooting or sucking, and help the baby to your breast for his or her first feeding. Stage 3  This stage starts immediately after the birth of your baby and ends after you deliver the placenta.  This stage may take anywhere from 5 to 30 minutes.  After your baby has been delivered, you will feel contractions as your body expels the placenta and your uterus contracts to control bleeding. What can I expect after labor and delivery?  After labor is over, you and your baby will be monitored closely until you are ready to go home to ensure that you are both healthy. Your health care team will teach you how to care for yourself  and your baby.  You and your baby will stay in the same room (rooming in) during your hospital stay. This will encourage early bonding and successful breastfeeding.  You may continue to receive fluids and medicines through an IV.  Your uterus will be checked and massaged regularly (fundal massage).  You will have some soreness and pain in your abdomen, vagina, and the area of skin between your vaginal opening and your anus (perineum).  If an incision was made near your vagina (episiotomy) or if you had some vaginal tearing during delivery, cold compresses may be placed on your episiotomy or your tear. This helps to reduce pain and swelling.  You may be given a squirt bottle to use instead of wiping when you go to the bathroom. To use the squirt bottle, follow these steps: ? Before you urinate, fill the squirt bottle with warm water. Do not use hot water. ? After you urinate, while you are sitting on the toilet, use the squirt bottle to rinse the area around your urethra and vaginal opening. This rinses away any urine and blood. ? Fill the squirt bottle with clean water every time you use the bathroom.  It is normal to have vaginal bleeding after delivery. Wear a sanitary pad for vaginal bleeding and discharge. Summary  Vaginal delivery means that you will give birth by pushing your baby out of your birth canal (vagina).  Your health care provider may monitor your contractions (uterine monitoring) and your baby's heart rate (fetal monitoring).  Your health care provider may perform a physical exam.  Normal labor and delivery is divided into three stages.  After labor is over, you and your baby will be monitored closely until you are ready to go home. This information is not intended to replace advice given to you by your health care provider. Make sure you discuss any questions you have with your health care provider. Document Revised: 10/11/2017 Document Reviewed: 10/11/2017 Elsevier  Patient Education  2020 [employer].   Third Trimester of Pregnancy The third trimester is from week 28 through week 40 (months 7 through 9). The third trimester is a time when the unborn baby (fetus) is growing rapidly. At the end of the ninth month, the fetus is about 20 inches in length and weighs 6-10 pounds. Body changes during your third trimester Your body will continue to go through many changes during pregnancy. The changes vary from woman to woman. During the third trimester:  Your weight will continue to increase. You can expect to gain 25-35 pounds (11-16 kg) by the end of the pregnancy.  You may begin to get stretch marks on your hips, abdomen, and breasts.  You may urinate more often because the fetus is moving lower into your pelvis and pressing on your bladder.  You may develop or continue to have heartburn. This is caused by increased hormones that slow down muscles in the  digestive tract.  You may develop or continue to have constipation because increased hormones slow digestion and cause the muscles that push waste through your intestines to relax.  You may develop hemorrhoids. These are swollen veins (varicose veins) in the rectum that can itch or be painful.  You may develop swollen, bulging veins (varicose veins) in your legs.  You may have increased body aches in the pelvis, back, or thighs. This is due to weight gain and increased hormones that are relaxing your joints.  You may have changes in your hair. These can include thickening of your hair, rapid growth, and changes in texture. Some women also have hair loss during or after pregnancy, or hair that feels dry or thin. Your hair will most likely return to normal after your baby is born.  Your breasts will continue to grow and they will continue to become tender. A yellow fluid (colostrum) may leak from your breasts. This is the first milk you are producing for your baby.  Your belly button may stick  out.  You may notice more swelling in your hands, face, or ankles.  You may have increased tingling or numbness in your hands, arms, and legs. The skin on your belly may also feel numb.  You may feel short of breath because of your expanding uterus.  You may have more problems sleeping. This can be caused by the size of your belly, increased need to urinate, and an increase in your body's metabolism.  You may notice the fetus "dropping," or moving lower in your abdomen (lightening).  You may have increased vaginal discharge.  You may notice your joints feel loose and you may have pain around your pelvic bone. What to expect at prenatal visits You will have prenatal exams every 2 weeks until week 36. Then you will have weekly prenatal exams. During a routine prenatal visit:  You will be weighed to make sure you and the baby are growing normally.  Your blood pressure will be taken.  Your abdomen will be measured to track your baby's growth.  The fetal heartbeat will be listened to.  Any test results from the previous visit will be discussed.  You may have a cervical check near your due date to see if your cervix has softened or thinned (effaced).  You will be tested for Group B streptococcus. This happens between 35 and 37 weeks. Your health care provider may ask you:  What your birth plan is.  How you are feeling.  If you are feeling the baby move.  If you have had any abnormal symptoms, such as leaking fluid, bleeding, severe headaches, or abdominal cramping.  If you are using any tobacco products, including cigarettes, chewing tobacco, and electronic cigarettes.  If you have any questions. Other tests or screenings that may be performed during your third trimester include:  Blood tests that check for low iron levels (anemia).  Fetal testing to check the health, activity level, and growth of the fetus. Testing is done if you have certain medical conditions or if there  are problems during the pregnancy.  Nonstress test (NST). This test checks the health of your baby to make sure there are no signs of problems, such as the baby not getting enough oxygen. During this test, a belt is placed around your belly. The baby is made to move, and its heart rate is monitored during movement. What is false labor? False labor is a condition in which you feel small, irregular tightenings  of the muscles in the womb (contractions) that usually go away with rest, changing position, or drinking water. These are called Braxton Hicks contractions. Contractions may last for hours, days, or even weeks before true labor sets in. If contractions come at regular intervals, become more frequent, increase in intensity, or become painful, you should see your health care provider. What are the signs of labor?  Abdominal cramps.  Regular contractions that start at 10 minutes apart and become stronger and more frequent with time.  Contractions that start on the top of the uterus and spread down to the lower abdomen and back.  Increased pelvic pressure and dull back pain.  A watery or bloody mucus discharge that comes from the vagina.  Leaking of amniotic fluid. This is also known as your "water breaking." It could be a slow trickle or a gush. Let your health care provider know if it has a color or strange odor. If you have any of these signs, call your health care provider right away, even if it is before your due date. Follow these instructions at home: Medicines  Follow your health care provider's instructions regarding medicine use. Specific medicines may be either safe or unsafe to take during pregnancy.  Take a prenatal vitamin that contains at least 600 micrograms (mcg) of folic acid.  If you develop constipation, try taking a stool softener if your health care provider approves. Eating and drinking   Eat a balanced diet that includes fresh fruits and vegetables, whole grains,  good sources of protein such as meat, eggs, or tofu, and low-fat dairy. Your health care provider will help you determine the amount of weight gain that is right for you.  Avoid raw meat and uncooked cheese. These carry germs that can cause birth defects in the baby.  If you have low calcium intake from food, talk to your health care provider about whether you should take a daily calcium supplement.  Eat four or five small meals rather than three large meals a day.  Limit foods that are high in fat and processed sugars, such as fried and sweet foods.  To prevent constipation: ? Drink enough fluid to keep your urine clear or pale yellow. ? Eat foods that are high in fiber, such as fresh fruits and vegetables, whole grains, and beans. Activity  Exercise only as directed by your health care provider. Most women can continue their usual exercise routine during pregnancy. Try to exercise for 30 minutes at least 5 days a week. Stop exercising if you experience uterine contractions.  Avoid heavy lifting.  Do not exercise in extreme heat or humidity, or at high altitudes.  Wear low-heel, comfortable shoes.  Practice good posture.  You may continue to have sex unless your health care provider tells you otherwise. Relieving pain and discomfort  Take frequent breaks and rest with your legs elevated if you have leg cramps or low back pain.  Take warm sitz baths to soothe any pain or discomfort caused by hemorrhoids. Use hemorrhoid cream if your health care provider approves.  Wear a good support bra to prevent discomfort from breast tenderness.  If you develop varicose veins: ? Wear support pantyhose or compression stockings as told by your healthcare provider. ? Elevate your feet for 15 minutes, 3-4 times a day. Prenatal care  Write down your questions. Take them to your prenatal visits.  Keep all your prenatal visits as told by your health care provider. This is  important. Safety  Wear  your seat belt at all times when driving.  Make a list of emergency phone numbers, including numbers for family, friends, the hospital, and police and fire departments. General instructions  Avoid cat litter boxes and soil used by cats. These carry germs that can cause birth defects in the baby. If you have a cat, ask someone to clean the litter box for you.  Do not travel far distances unless it is absolutely necessary and only with the approval of your health care provider.  Do not use hot tubs, steam rooms, or saunas.  Do not drink alcohol.  Do not use any products that contain nicotine or tobacco, such as cigarettes and e-cigarettes. If you need help quitting, ask your health care provider.  Do not use any medicinal herbs or unprescribed drugs. These chemicals affect the formation and growth of the baby.  Do not douche or use tampons or scented sanitary pads.  Do not cross your legs for long periods of time.  To prepare for the arrival of your baby: ? Take prenatal classes to understand, practice, and ask questions about labor and delivery. ? Make a trial run to the hospital. ? Visit the hospital and tour the maternity area. ? Arrange for maternity or paternity leave through employers. ? Arrange for family and friends to take care of pets while you are in the hospital. ? Purchase a rear-facing car seat and make sure you know how to install it in your car. ? Pack your hospital bag. ? Prepare the baby's nursery. Make sure to remove all pillows and stuffed animals from the baby's crib to prevent suffocation.  Visit your dentist if you have not gone during your pregnancy. Use a soft toothbrush to brush your teeth and be gentle when you floss. Contact a health care provider if:  You are unsure if you are in labor or if your water has broken.  You become dizzy.  You have mild pelvic cramps, pelvic pressure, or nagging pain in your abdominal area.  You  have lower back pain.  You have persistent nausea, vomiting, or diarrhea.  You have an unusual or bad smelling vaginal discharge.  You have pain when you urinate. Get help right away if:  Your water breaks before 37 weeks.  You have regular contractions less than 5 minutes apart before 37 weeks.  You have a fever.  You are leaking fluid from your vagina.  You have spotting or bleeding from your vagina.  You have severe abdominal pain or cramping.  You have rapid weight loss or weight gain.  You have shortness of breath with chest pain.  You notice sudden or extreme swelling of your face, hands, ankles, feet, or legs.  Your baby makes fewer than 10 movements in 2 hours.  You have severe headaches that do not go away when you take medicine.  You have vision changes. Summary  The third trimester is from week 28 through week 40, months 7 through 9. The third trimester is a time when the unborn baby (fetus) is growing rapidly.  During the third trimester, your discomfort may increase as you and your baby continue to gain weight. You may have abdominal, leg, and back pain, sleeping problems, and an increased need to urinate.  During the third trimester your breasts will keep growing and they will continue to become tender. A yellow fluid (colostrum) may leak from your breasts. This is the first milk you are producing for your baby.  False labor is  a condition in which you feel small, irregular tightenings of the muscles in the womb (contractions) that eventually go away. These are called Braxton Hicks contractions. Contractions may last for hours, days, or even weeks before true labor sets in.  Signs of labor can include: abdominal cramps; regular contractions that start at 10 minutes apart and become stronger and more frequent with time; watery or bloody mucus discharge that comes from the vagina; increased pelvic pressure and dull back pain; and leaking of amniotic fluid. This  information is not intended to replace advice given to you by your health care provider. Make sure you discuss any questions you have with your health care provider. Document Revised: 12/28/2018 Document Reviewed: 10/12/2016 Elsevier Patient Education  2020 [employer].

## 2020-05-13 ENCOUNTER — Ambulatory Visit (INDEPENDENT_AMBULATORY_CARE_PROVIDER_SITE_OTHER): Payer: Medicaid Other | Admitting: Obstetrics and Gynecology

## 2020-05-13 ENCOUNTER — Other Ambulatory Visit (HOSPITAL_COMMUNITY)
Admission: RE | Admit: 2020-05-13 | Discharge: 2020-05-13 | Disposition: A | Discharge status: Home / Self Care | Payer: Medicaid Other | Source: Ambulatory Visit | Attending: Obstetrics and Gynecology | Admitting: Obstetrics and Gynecology

## 2020-05-13 ENCOUNTER — Other Ambulatory Visit: Payer: Self-pay

## 2020-05-13 ENCOUNTER — Encounter: Payer: Self-pay | Admitting: Obstetrics and Gynecology

## 2020-05-13 VITALS — BP 134/72 | Ht 70.0 in | Wt 289.8 lb

## 2020-05-13 DIAGNOSIS — Z3A36 36 weeks gestation of pregnancy: Secondary | ICD-10-CM | POA: Insufficient documentation

## 2020-05-13 DIAGNOSIS — O99213 Obesity complicating pregnancy, third trimester: Secondary | ICD-10-CM

## 2020-05-13 DIAGNOSIS — O0993 Supervision of high risk pregnancy, unspecified, third trimester: Secondary | ICD-10-CM | POA: Insufficient documentation

## 2020-05-13 LAB — POCT URINALYSIS DIPSTICK OB
Glucose, UA: NEGATIVE
POC,PROTEIN,UA: NEGATIVE

## 2020-05-13 NOTE — Patient Instructions (Signed)
Third Trimester of Pregnancy The third trimester is from week 28 through week 40 (months 7 through 9). The third trimester is a time when the unborn baby (fetus) is growing rapidly. At the end of the ninth month, the fetus is about 20 inches in length and weighs 6-10 pounds. Body changes during your third trimester Your body will continue to go through many changes during pregnancy. The changes vary from woman to woman. During the third trimester:  Your weight will continue to increase. You can expect to gain 25-35 pounds (11-16 kg) by the end of the pregnancy.  You may begin to get stretch marks on your hips, abdomen, and breasts.  You may urinate more often because the fetus is moving lower into your pelvis and pressing on your bladder.  You may develop or continue to have heartburn. This is caused by increased hormones that slow down muscles in the digestive tract.  You may develop or continue to have constipation because increased hormones slow digestion and cause the muscles that push waste through your intestines to relax.  You may develop hemorrhoids. These are swollen veins (varicose veins) in the rectum that can itch or be painful.  You may develop swollen, bulging veins (varicose veins) in your legs.  You may have increased body aches in the pelvis, back, or thighs. This is due to weight gain and increased hormones that are relaxing your joints.  You may have changes in your hair. These can include thickening of your hair, rapid growth, and changes in texture. Some women also have hair loss during or after pregnancy, or hair that feels dry or thin. Your hair will most likely return to normal after your baby is born.  Your breasts will continue to grow and they will continue to become tender. A yellow fluid (colostrum) may leak from your breasts. This is the first milk you are producing for your baby.  Your belly button may stick out.  You may notice more swelling in your hands,  face, or ankles.  You may have increased tingling or numbness in your hands, arms, and legs. The skin on your belly may also feel numb.  You may feel short of breath because of your expanding uterus.  You may have more problems sleeping. This can be caused by the size of your belly, increased need to urinate, and an increase in your body's metabolism.  You may notice the fetus "dropping," or moving lower in your abdomen (lightening).  You may have increased vaginal discharge.  You may notice your joints feel loose and you may have pain around your pelvic bone. What to expect at prenatal visits You will have prenatal exams every 2 weeks until week 36. Then you will have weekly prenatal exams. During a routine prenatal visit:  You will be weighed to make sure you and the baby are growing normally.  Your blood pressure will be taken.  Your abdomen will be measured to track your baby's growth.  The fetal heartbeat will be listened to.  Any test results from the previous visit will be discussed.  You may have a cervical check near your due date to see if your cervix has softened or thinned (effaced).  You will be tested for Group B streptococcus. This happens between 35 and 37 weeks. Your health care provider may ask you:  What your birth plan is.  How you are feeling.  If you are feeling the baby move.  If you have had any abnormal   symptoms, such as leaking fluid, bleeding, severe headaches, or abdominal cramping.  If you are using any tobacco products, including cigarettes, chewing tobacco, and electronic cigarettes.  If you have any questions. Other tests or screenings that may be performed during your third trimester include:  Blood tests that check for low iron levels (anemia).  Fetal testing to check the health, activity level, and growth of the fetus. Testing is done if you have certain medical conditions or if there are problems during the pregnancy.  Nonstress test  (NST). This test checks the health of your baby to make sure there are no signs of problems, such as the baby not getting enough oxygen. During this test, a belt is placed around your belly. The baby is made to move, and its heart rate is monitored during movement. What is false labor? False labor is a condition in which you feel small, irregular tightenings of the muscles in the womb (contractions) that usually go away with rest, changing position, or drinking water. These are called Braxton Hicks contractions. Contractions may last for hours, days, or even weeks before true labor sets in. If contractions come at regular intervals, become more frequent, increase in intensity, or become painful, you should see your health care provider. What are the signs of labor?  Abdominal cramps.  Regular contractions that start at 10 minutes apart and become stronger and more frequent with time.  Contractions that start on the top of the uterus and spread down to the lower abdomen and back.  Increased pelvic pressure and dull back pain.  A watery or bloody mucus discharge that comes from the vagina.  Leaking of amniotic fluid. This is also known as your "water breaking." It could be a slow trickle or a gush. Let your health care provider know if it has a color or strange odor. If you have any of these signs, call your health care provider right away, even if it is before your due date. Follow these instructions at home: Medicines  Follow your health care provider's instructions regarding medicine use. Specific medicines may be either safe or unsafe to take during pregnancy.  Take a prenatal vitamin that contains at least 600 micrograms (mcg) of folic acid.  If you develop constipation, try taking a stool softener if your health care provider approves. Eating and drinking   Eat a balanced diet that includes fresh fruits and vegetables, whole grains, good sources of protein such as meat, eggs, or tofu,  and low-fat dairy. Your health care provider will help you determine the amount of weight gain that is right for you.  Avoid raw meat and uncooked cheese. These carry germs that can cause birth defects in the baby.  If you have low calcium intake from food, talk to your health care provider about whether you should take a daily calcium supplement.  Eat four or five small meals rather than three large meals a day.  Limit foods that are high in fat and processed sugars, such as fried and sweet foods.  To prevent constipation: ? Drink enough fluid to keep your urine clear or pale yellow. ? Eat foods that are high in fiber, such as fresh fruits and vegetables, whole grains, and beans. Activity  Exercise only as directed by your health care provider. Most women can continue their usual exercise routine during pregnancy. Try to exercise for 30 minutes at least 5 days a week. Stop exercising if you experience uterine contractions.  Avoid heavy lifting.  Do   not exercise in extreme heat or humidity, or at high altitudes.  Wear low-heel, comfortable shoes.  Practice good posture.  You may continue to have sex unless your health care provider tells you otherwise. Relieving pain and discomfort  Take frequent breaks and rest with your legs elevated if you have leg cramps or low back pain.  Take warm sitz baths to soothe any pain or discomfort caused by hemorrhoids. Use hemorrhoid cream if your health care provider approves.  Wear a good support bra to prevent discomfort from breast tenderness.  If you develop varicose veins: ? Wear support pantyhose or compression stockings as told by your healthcare provider. ? Elevate your feet for 15 minutes, 3-4 times a day. Prenatal care  Write down your questions. Take them to your prenatal visits.  Keep all your prenatal visits as told by your health care provider. This is important. Safety  Wear your seat belt at all times when driving.  Make  a list of emergency phone numbers, including numbers for family, friends, the hospital, and police and fire departments. General instructions  Avoid cat litter boxes and soil used by cats. These carry germs that can cause birth defects in the baby. If you have a cat, ask someone to clean the litter box for you.  Do not travel far distances unless it is absolutely necessary and only with the approval of your health care provider.  Do not use hot tubs, steam rooms, or saunas.  Do not drink alcohol.  Do not use any products that contain nicotine or tobacco, such as cigarettes and e-cigarettes. If you need help quitting, ask your health care provider.  Do not use any medicinal herbs or unprescribed drugs. These chemicals affect the formation and growth of the baby.  Do not douche or use tampons or scented sanitary pads.  Do not cross your legs for long periods of time.  To prepare for the arrival of your baby: ? Take prenatal classes to understand, practice, and ask questions about labor and delivery. ? Make a trial run to the hospital. ? Visit the hospital and tour the maternity area. ? Arrange for maternity or paternity leave through employers. ? Arrange for family and friends to take care of pets while you are in the hospital. ? Purchase a rear-facing car seat and make sure you know how to install it in your car. ? Pack your hospital bag. ? Prepare the baby's nursery. Make sure to remove all pillows and stuffed animals from the baby's crib to prevent suffocation.  Visit your dentist if you have not gone during your pregnancy. Use a soft toothbrush to brush your teeth and be gentle when you floss. Contact a health care provider if:  You are unsure if you are in labor or if your water has broken.  You become dizzy.  You have mild pelvic cramps, pelvic pressure, or nagging pain in your abdominal area.  You have lower back pain.  You have persistent nausea, vomiting, or  diarrhea.  You have an unusual or bad smelling vaginal discharge.  You have pain when you urinate. Get help right away if:  Your water breaks before 37 weeks.  You have regular contractions less than 5 minutes apart before 37 weeks.  You have a fever.  You are leaking fluid from your vagina.  You have spotting or bleeding from your vagina.  You have severe abdominal pain or cramping.  You have rapid weight loss or weight gain.  You have   shortness of breath with chest pain.  You notice sudden or extreme swelling of your face, hands, ankles, feet, or legs.  Your baby makes fewer than 10 movements in 2 hours.  You have severe headaches that do not go away when you take medicine.  You have vision changes. Summary  The third trimester is from week 28 through week 40, months 7 through 9. The third trimester is a time when the unborn baby (fetus) is growing rapidly.  During the third trimester, your discomfort may increase as you and your baby continue to gain weight. You may have abdominal, leg, and back pain, sleeping problems, and an increased need to urinate.  During the third trimester your breasts will keep growing and they will continue to become tender. A yellow fluid (colostrum) may leak from your breasts. This is the first milk you are producing for your baby.  False labor is a condition in which you feel small, irregular tightenings of the muscles in the womb (contractions) that eventually go away. These are called Braxton Hicks contractions. Contractions may last for hours, days, or even weeks before true labor sets in.  Signs of labor can include: abdominal cramps; regular contractions that start at 10 minutes apart and become stronger and more frequent with time; watery or bloody mucus discharge that comes from the vagina; increased pelvic pressure and dull back pain; and leaking of amniotic fluid. This information is not intended to replace advice given to you by your  health care provider. Make sure you discuss any questions you have with your health care provider. Document Revised: 12/28/2018 Document Reviewed: 10/12/2016 Elsevier Patient Education  2020 [employer]  

## 2020-05-13 NOTE — Progress Notes (Signed)
Routine Prenatal Care Visit  Subjective  Emily Payne is a 29 y.o. G3P1011 at [redacted]w[redacted]d being seen today for ongoing prenatal care.  She is currently monitored for the following issues for this high-risk pregnancy and has Obesity affecting pregnancy; Dandruff; Depression; Generalized anxiety disorder with panic attacks; S/P laparoscopic surgery; Supervision of high risk pregnancy in first trimester; and BMI 37.0-37.9, adult on their problem list.  ----------------------------------------------------------------------------------- Patient reports no complaints.   Contractions: Irregular. Vag. Bleeding: None.  Movement: Present. Denies leaking of fluid.  ----------------------------------------------------------------------------------- The following portions of the patient's history were reviewed and updated as appropriate: allergies, current medications, past family history, past medical history, past social history, past surgical history and problem list. Problem list updated.   Objective  Blood pressure 134/72, height 5\' 10"  (1.778 m), weight 289 lb 12.8 oz (131.5 kg), last menstrual period 09/01/2019. Pregravid weight 258 lb (117 kg) Total Weight Gain 31 lb 12.8 oz (14.4 kg) Urinalysis:      Fetal Status: Fetal Heart Rate (bpm): 140   Movement: Present  Presentation: Vertex  General:  Alert, oriented and cooperative. Patient is in no acute distress.  Skin: Skin is warm and dry. No rash noted.   Cardiovascular: Normal heart rate noted  Respiratory: Normal respiratory effort, no problems with respiration noted  Abdomen: Soft, gravid, appropriate for gestational age. Pain/Pressure: Absent     Pelvic:  Cervical exam performed Dilation: Closed Effacement (%): 0 Station: -3  Extremities: Normal range of motion.     Mental Status: Normal mood and affect. Normal behavior. Normal judgment and thought content.     Assessment   29 y.o. G3P1011 at [redacted]w[redacted]d by  06/07/2020, by Last Menstrual  Period presenting for routine prenatal visit  Plan   Pregnancy#3 Problems (from 09/01/19 to present)    Problem Noted Resolved   BMI 37.0-37.9, adult 01/23/2020 by 03/24/2020, MD No   Supervision of high risk pregnancy in first trimester 11/14/2019 by 11/16/2019, MD No   Overview Addendum 04/18/2020 10:45 AM by 04/20/2020, CNM     Nursing Staff Provider  Office Location  [facility]ating   LMP=7wk Emily Payne  Language  English Anatomy US  Incomplete, suboptimal scan with MFM, close exam after birth advised  Flu Vaccine   Genetic Screen  NIPS:normal XX  TDaP vaccine   given 7/16 Hgb A1C or  GTT Early :102 Third trimester :   Rhogam  03/07/20   LAB RESULTS   Feeding Plan  breast Blood Type A/Negative/-- (02/24 1026)   Contraception  Antibody Negative (02/24 1026)  Circumcision  Rubella 7.10 (02/24 1026)  Pediatrician   RPR Non Reactive (02/24 1026)   Support Person  HBsAg Negative (02/24 1026)   Prenatal Classes  HIV Non Reactive (02/24 1026)    Varicella immune  BTL Consent  GBS  (For PCN allergy, check sensitivities)        VBAC Consent Desires TOLAC Pap  06/2017 NIL    Hgb Electro      CF      SMA               Previous Version   Obesity affecting pregnancy 07/04/2012 by 07/06/2012, NP No   Overview Signed 10/11/2018  5:36 PM by 10/13/2018, MD    Referred to nutritionist -- did not attend Jan 2020          GBS/ GC/CT today Start NST for BMI next week  Gestational  age appropriate obstetric precautions including but not limited to vaginal bleeding, contractions, leaking of fluid and fetal movement were reviewed in detail with the patient.    Return in about 1 week (around 05/20/2020) for ROB/NST weekly for 4 more weeks, okay to double book with Emily Payne if patient desires.  Emily Milch MD [facility], [facility] 05/13/2020, 11:35 AM

## 2020-05-14 ENCOUNTER — Other Ambulatory Visit: Payer: Self-pay | Admitting: Obstetrics and Gynecology

## 2020-05-14 LAB — CERVICOVAGINAL ANCILLARY ONLY
Chlamydia: NEGATIVE
Comment: NEGATIVE
Comment: NORMAL
Neisseria Gonorrhea: NEGATIVE

## 2020-05-18 LAB — CULTURE, BETA STREP (GROUP B ONLY): Strep Gp B Culture: NEGATIVE

## 2020-05-20 ENCOUNTER — Ambulatory Visit (INDEPENDENT_AMBULATORY_CARE_PROVIDER_SITE_OTHER): Payer: Medicaid Other | Admitting: Obstetrics

## 2020-05-20 ENCOUNTER — Other Ambulatory Visit: Payer: Self-pay

## 2020-05-20 VITALS — BP 130/70 | Wt 295.8 lb

## 2020-05-20 DIAGNOSIS — O0993 Supervision of high risk pregnancy, unspecified, third trimester: Secondary | ICD-10-CM

## 2020-05-20 DIAGNOSIS — Z3A37 37 weeks gestation of pregnancy: Secondary | ICD-10-CM

## 2020-05-20 NOTE — Progress Notes (Signed)
Routine Prenatal Care Visit  Subjective  Emily Payne is a 29 y.o. G3P1011 at [redacted]w[redacted]d being seen today for ongoing prenatal care.  She is currently monitored for the following issues for this high-risk pregnancy and has Obesity affecting pregnancy; Dandruff; Depression; Generalized anxiety disorder with panic attacks; S/P laparoscopic surgery; Supervision of high risk pregnancy in first trimester; and BMI 37.0-37.9, adult on their problem list. She is having  Weekly NSTs secondary to her high BMI. ----------------------------------------------------------------------------------- Patient reports no complaints.   Contractions: Irregular. Vag. Bleeding: None.  Movement: Present. Leaking Fluid denies.  ----------------------------------------------------------------------------------- The following portions of the patient's history were reviewed and updated as appropriate: allergies, current medications, past family history, past medical history, past social history, past surgical history and problem list. Problem list updated.  Objective  Blood pressure 130/70, weight 295 lb 12.8 oz (134.2 kg), last menstrual period 09/01/2019. Pregravid weight 258 lb (117 kg) Total Weight Gain 37 lb 12.8 oz (17.1 kg) Urinalysis: Urine Protein    Urine Glucose    Fetal Status: Fetal Heart Rate (bpm): RNST Fundal Height: 38 cm Movement: Present     General:  Alert, oriented and cooperative. Patient is in no acute distress.  Skin: Skin is warm and dry. No rash noted.   Cardiovascular: Normal heart rate noted  Respiratory: Normal respiratory effort, no problems with respiration noted  Abdomen: Soft, gravid, appropriate for gestational age. Pain/Pressure: Absent     Pelvic:  Cervical exam deferred        Extremities: Normal range of motion.  Edema: None  Mental Status: Normal mood and affect. Normal behavior. Normal judgment and thought content.   Assessment   29 y.o. G3P1011 at [redacted]w[redacted]d by  06/07/2020, by Last  Menstrual Period presenting for routine prenatal visit Weekly NSTs. Monthly sonos for growth. Last was two weeks ago.  Plan   Pregnancy#3 Problems (from 09/01/19 to present)    Problem Noted Resolved   BMI 37.0-37.9, adult 01/23/2020 by Conard Novak, MD No   Supervision of high risk pregnancy in first trimester 11/14/2019 by Natale Milch, MD No   Overview Addendum 04/18/2020 10:45 AM by [name], CNM     Nursing Staff Provider  Office Location  [facility]ating   LMP=7wk [place]  Language  English Anatomy US  Incomplete, suboptimal scan with MFM, close exam after birth advised  Flu Vaccine   Genetic Screen  NIPS:normal XX  TDaP vaccine   given 7/16 Hgb A1C or  GTT Early :102 Third trimester :   Rhogam  03/07/20   LAB RESULTS   Feeding Plan  breast Blood Type A/Negative/-- (02/24 1026)   Contraception  Antibody Negative (02/24 1026)  Circumcision  Rubella 7.10 (02/24 1026)  Pediatrician   RPR Non Reactive (02/24 1026)   Support Person  HBsAg Negative (02/24 1026)   Prenatal Classes  HIV Non Reactive (02/24 1026)    Varicella immune  BTL Consent  GBS  (For PCN allergy, check sensitivities)        VBAC Consent Desires TOLAC Pap  06/2017 NIL    Hgb Electro      CF      SMA               Previous Version   Obesity affecting pregnancy 07/04/2012 by Delbert Phenix, NP No   Overview Signed 10/11/2018  5:36 PM by Kerman Passey, MD    Referred to nutritionist -- did not attend Jan 2020  Term labor symptoms and general obstetric precautions including but not limited to vaginal bleeding, contractions, leaking of fluid and fetal movement were reviewed in detail with the patient. Please refer to After Visit Summary for other counseling recommendations.  She has a strong preference for Dr. Gaynelle Arabian at delivery.  Return in about 1 week (around 05/27/2020) for return OB and NST with [facility] preferred., return OB.  [name], CNM  05/20/2020 5:30 PM

## 2020-05-30 ENCOUNTER — Other Ambulatory Visit: Payer: Self-pay

## 2020-05-30 ENCOUNTER — Ambulatory Visit (INDEPENDENT_AMBULATORY_CARE_PROVIDER_SITE_OTHER): Payer: Medicaid Other | Admitting: Obstetrics and Gynecology

## 2020-05-30 ENCOUNTER — Encounter: Payer: Self-pay | Admitting: Obstetrics and Gynecology

## 2020-05-30 VITALS — BP 122/72 | Wt 297.0 lb

## 2020-05-30 DIAGNOSIS — O99343 Other mental disorders complicating pregnancy, third trimester: Secondary | ICD-10-CM

## 2020-05-30 DIAGNOSIS — O99213 Obesity complicating pregnancy, third trimester: Secondary | ICD-10-CM | POA: Diagnosis not present

## 2020-05-30 DIAGNOSIS — L732 Hidradenitis suppurativa: Secondary | ICD-10-CM

## 2020-05-30 DIAGNOSIS — Z3A38 38 weeks gestation of pregnancy: Secondary | ICD-10-CM | POA: Diagnosis not present

## 2020-05-30 MED ORDER — CLINDAMYCIN PHOSPHATE 1 % EX GEL: CUTANEOUS | 6 refills | Status: AC

## 2020-05-30 NOTE — Progress Notes (Signed)
Pt states she's here for ROB.

## 2020-05-30 NOTE — Progress Notes (Signed)
Routine Prenatal Care Visit  Subjective  Emily Payne is a 29 y.o. G3P1011 at [redacted]w[redacted]d being seen today for ongoing prenatal care.  She is currently monitored for the following issues for this low-risk pregnancy and has Obesity affecting pregnancy; Dandruff; Depression; Generalized anxiety disorder with panic attacks; S/P laparoscopic surgery; Supervision of high risk pregnancy in first trimester; and BMI 37.0-37.9, adult on their problem list.  ----------------------------------------------------------------------------------- Patient reports no complaints.   Contractions: Irregular. Vag. Bleeding: None.  Movement: Present. Denies leaking of fluid.  ----------------------------------------------------------------------------------- The following portions of the patient's history were reviewed and updated as appropriate: allergies, current medications, past family history, past medical history, past social history, past surgical history and problem list. Problem list updated.   Objective  Blood pressure 122/72, weight 297 lb (134.7 kg), last menstrual period 09/01/2019. Pregravid weight 258 lb (117 kg) Total Weight Gain 39 lb (17.7 kg) Urinalysis:      Fetal Status: Fetal Heart Rate (bpm): 140 Fundal Height: 38 cm Movement: Present     General:  Alert, oriented and cooperative. Patient is in no acute distress.  Skin: Skin is warm and dry. No rash noted.   Cardiovascular: Normal heart rate noted  Respiratory: Normal respiratory effort, no problems with respiration noted  Abdomen: Soft, gravid, appropriate for gestational age. Pain/Pressure: Absent     Pelvic:  Cervical exam deferred Dilation: 1 Effacement (%): 50 Station: -3  Extremities: Normal range of motion.     Mental Status: Normal mood and affect. Normal behavior. Normal judgment and thought content.     Assessment   29 y.o. G3P1011 at [redacted]w[redacted]d by  06/07/2020, by Last Menstrual Period presenting for routine prenatal visit  Plan    Pregnancy#3 Problems (from 09/01/19 to present)    Problem Noted Resolved   BMI 37.0-37.9, adult 01/23/2020 by Conard Novak, MD No   Supervision of high risk pregnancy in first trimester 11/14/2019 by Natale Milch, MD No   Overview Addendum 04/18/2020 10:45 AM by [name], CNM     Nursing Staff Provider  Office Location  [facility]ating   LMP=7wk [place]  Language  English Anatomy US  Incomplete, suboptimal scan with MFM, close exam after birth advised  Flu Vaccine   Genetic Screen  NIPS:normal XX  TDaP vaccine   given 7/16 Hgb A1C or  GTT Early :102 Third trimester :   Rhogam  03/07/20   LAB RESULTS   Feeding Plan  breast Blood Type A/Negative/-- (02/24 1026)   Contraception  Antibody Negative (02/24 1026)  Circumcision  Rubella 7.10 (02/24 1026)  Pediatrician   RPR Non Reactive (02/24 1026)   Support Person  HBsAg Negative (02/24 1026)   Prenatal Classes  HIV Non Reactive (02/24 1026)    Varicella immune  BTL Consent  GBS  (For PCN allergy, check sensitivities)        VBAC Consent Desires TOLAC Pap  06/2017 NIL    Hgb Electro      CF      SMA               Previous Version   Obesity affecting pregnancy 07/04/2012 by Delbert Phenix, NP No   Overview Signed 10/11/2018  5:36 PM by Kerman Passey, MD    Referred to nutritionist -- did not attend Jan 2020          Topical clindamycin for hidradenitis of groin Membranes swept at maternal request Reactive NST  Gestational age appropriate obstetric  precautions including but not limited to vaginal bleeding, contractions, leaking of fluid and fetal movement were reviewed in detail with the patient.    Return in about 1 week (around 06/06/2020) for ROB in person/ NST.  Natale Milch MD [facility], [facility] 05/30/2020, 10:50 AM

## 2020-05-30 NOTE — Patient Instructions (Signed)
Third Trimester of Pregnancy The third trimester is from week 28 through week 40 (months 7 through 9). The third trimester is a time when the unborn baby (fetus) is growing rapidly. At the end of the ninth month, the fetus is about 20 inches in length and weighs 6-10 pounds. Body changes during your third trimester Your body will continue to go through many changes during pregnancy. The changes vary from woman to woman. During the third trimester:  Your weight will continue to increase. You can expect to gain 25-35 pounds (11-16 kg) by the end of the pregnancy.  You may begin to get stretch marks on your hips, abdomen, and breasts.  You may urinate more often because the fetus is moving lower into your pelvis and pressing on your bladder.  You may develop or continue to have heartburn. This is caused by increased hormones that slow down muscles in the digestive tract.  You may develop or continue to have constipation because increased hormones slow digestion and cause the muscles that push waste through your intestines to relax.  You may develop hemorrhoids. These are swollen veins (varicose veins) in the rectum that can itch or be painful.  You may develop swollen, bulging veins (varicose veins) in your legs.  You may have increased body aches in the pelvis, back, or thighs. This is due to weight gain and increased hormones that are relaxing your joints.  You may have changes in your hair. These can include thickening of your hair, rapid growth, and changes in texture. Some women also have hair loss during or after pregnancy, or hair that feels dry or thin. Your hair will most likely return to normal after your baby is born.  Your breasts will continue to grow and they will continue to become tender. A yellow fluid (colostrum) may leak from your breasts. This is the first milk you are producing for your baby.  Your belly button may stick out.  You may notice more swelling in your hands,  face, or ankles.  You may have increased tingling or numbness in your hands, arms, and legs. The skin on your belly may also feel numb.  You may feel short of breath because of your expanding uterus.  You may have more problems sleeping. This can be caused by the size of your belly, increased need to urinate, and an increase in your body's metabolism.  You may notice the fetus "dropping," or moving lower in your abdomen (lightening).  You may have increased vaginal discharge.  You may notice your joints feel loose and you may have pain around your pelvic bone. What to expect at prenatal visits You will have prenatal exams every 2 weeks until week 36. Then you will have weekly prenatal exams. During a routine prenatal visit:  You will be weighed to make sure you and the baby are growing normally.  Your blood pressure will be taken.  Your abdomen will be measured to track your baby's growth.  The fetal heartbeat will be listened to.  Any test results from the previous visit will be discussed.  You may have a cervical check near your due date to see if your cervix has softened or thinned (effaced).  You will be tested for Group B streptococcus. This happens between 35 and 37 weeks. Your health care provider may ask you:  What your birth plan is.  How you are feeling.  If you are feeling the baby move.  If you have had any abnormal   symptoms, such as leaking fluid, bleeding, severe headaches, or abdominal cramping.  If you are using any tobacco products, including cigarettes, chewing tobacco, and electronic cigarettes.  If you have any questions. Other tests or screenings that may be performed during your third trimester include:  Blood tests that check for low iron levels (anemia).  Fetal testing to check the health, activity level, and growth of the fetus. Testing is done if you have certain medical conditions or if there are problems during the pregnancy.  Nonstress test  (NST). This test checks the health of your baby to make sure there are no signs of problems, such as the baby not getting enough oxygen. During this test, a belt is placed around your belly. The baby is made to move, and its heart rate is monitored during movement. What is false labor? False labor is a condition in which you feel small, irregular tightenings of the muscles in the womb (contractions) that usually go away with rest, changing position, or drinking water. These are called Braxton Hicks contractions. Contractions may last for hours, days, or even weeks before true labor sets in. If contractions come at regular intervals, become more frequent, increase in intensity, or become painful, you should see your health care provider. What are the signs of labor?  Abdominal cramps.  Regular contractions that start at 10 minutes apart and become stronger and more frequent with time.  Contractions that start on the top of the uterus and spread down to the lower abdomen and back.  Increased pelvic pressure and dull back pain.  A watery or bloody mucus discharge that comes from the vagina.  Leaking of amniotic fluid. This is also known as your "water breaking." It could be a slow trickle or a gush. Let your health care provider know if it has a color or strange odor. If you have any of these signs, call your health care provider right away, even if it is before your due date. Follow these instructions at home: Medicines  Follow your health care provider's instructions regarding medicine use. Specific medicines may be either safe or unsafe to take during pregnancy.  Take a prenatal vitamin that contains at least 600 micrograms (mcg) of folic acid.  If you develop constipation, try taking a stool softener if your health care provider approves. Eating and drinking   Eat a balanced diet that includes fresh fruits and vegetables, whole grains, good sources of protein such as meat, eggs, or tofu,  and low-fat dairy. Your health care provider will help you determine the amount of weight gain that is right for you.  Avoid raw meat and uncooked cheese. These carry germs that can cause birth defects in the baby.  If you have low calcium intake from food, talk to your health care provider about whether you should take a daily calcium supplement.  Eat four or five small meals rather than three large meals a day.  Limit foods that are high in fat and processed sugars, such as fried and sweet foods.  To prevent constipation: ? Drink enough fluid to keep your urine clear or pale yellow. ? Eat foods that are high in fiber, such as fresh fruits and vegetables, whole grains, and beans. Activity  Exercise only as directed by your health care provider. Most women can continue their usual exercise routine during pregnancy. Try to exercise for 30 minutes at least 5 days a week. Stop exercising if you experience uterine contractions.  Avoid heavy lifting.  Do   not exercise in extreme heat or humidity, or at high altitudes.  Wear low-heel, comfortable shoes.  Practice good posture.  You may continue to have sex unless your health care provider tells you otherwise. Relieving pain and discomfort  Take frequent breaks and rest with your legs elevated if you have leg cramps or low back pain.  Take warm sitz baths to soothe any pain or discomfort caused by hemorrhoids. Use hemorrhoid cream if your health care provider approves.  Wear a good support bra to prevent discomfort from breast tenderness.  If you develop varicose veins: ? Wear support pantyhose or compression stockings as told by your healthcare provider. ? Elevate your feet for 15 minutes, 3-4 times a day. Prenatal care  Write down your questions. Take them to your prenatal visits.  Keep all your prenatal visits as told by your health care provider. This is important. Safety  Wear your seat belt at all times when driving.  Make  a list of emergency phone numbers, including numbers for family, friends, the hospital, and police and fire departments. General instructions  Avoid cat litter boxes and soil used by cats. These carry germs that can cause birth defects in the baby. If you have a cat, ask someone to clean the litter box for you.  Do not travel far distances unless it is absolutely necessary and only with the approval of your health care provider.  Do not use hot tubs, steam rooms, or saunas.  Do not drink alcohol.  Do not use any products that contain nicotine or tobacco, such as cigarettes and e-cigarettes. If you need help quitting, ask your health care provider.  Do not use any medicinal herbs or unprescribed drugs. These chemicals affect the formation and growth of the baby.  Do not douche or use tampons or scented sanitary pads.  Do not cross your legs for long periods of time.  To prepare for the arrival of your baby: ? Take prenatal classes to understand, practice, and ask questions about labor and delivery. ? Make a trial run to the hospital. ? Visit the hospital and tour the maternity area. ? Arrange for maternity or paternity leave through employers. ? Arrange for family and friends to take care of pets while you are in the hospital. ? Purchase a rear-facing car seat and make sure you know how to install it in your car. ? Pack your hospital bag. ? Prepare the baby's nursery. Make sure to remove all pillows and stuffed animals from the baby's crib to prevent suffocation.  Visit your dentist if you have not gone during your pregnancy. Use a soft toothbrush to brush your teeth and be gentle when you floss. Contact a health care provider if:  You are unsure if you are in labor or if your water has broken.  You become dizzy.  You have mild pelvic cramps, pelvic pressure, or nagging pain in your abdominal area.  You have lower back pain.  You have persistent nausea, vomiting, or  diarrhea.  You have an unusual or bad smelling vaginal discharge.  You have pain when you urinate. Get help right away if:  Your water breaks before 37 weeks.  You have regular contractions less than 5 minutes apart before 37 weeks.  You have a fever.  You are leaking fluid from your vagina.  You have spotting or bleeding from your vagina.  You have severe abdominal pain or cramping.  You have rapid weight loss or weight gain.  You have   shortness of breath with chest pain.  You notice sudden or extreme swelling of your face, hands, ankles, feet, or legs.  Your baby makes fewer than 10 movements in 2 hours.  You have severe headaches that do not go away when you take medicine.  You have vision changes. Summary  The third trimester is from week 28 through week 40, months 7 through 9. The third trimester is a time when the unborn baby (fetus) is growing rapidly.  During the third trimester, your discomfort may increase as you and your baby continue to gain weight. You may have abdominal, leg, and back pain, sleeping problems, and an increased need to urinate.  During the third trimester your breasts will keep growing and they will continue to become tender. A yellow fluid (colostrum) may leak from your breasts. This is the first milk you are producing for your baby.  False labor is a condition in which you feel small, irregular tightenings of the muscles in the womb (contractions) that eventually go away. These are called Braxton Hicks contractions. Contractions may last for hours, days, or even weeks before true labor sets in.  Signs of labor can include: abdominal cramps; regular contractions that start at 10 minutes apart and become stronger and more frequent with time; watery or bloody mucus discharge that comes from the vagina; increased pelvic pressure and dull back pain; and leaking of amniotic fluid. This information is not intended to replace advice given to you by your  health care provider. Make sure you discuss any questions you have with your health care provider. Document Revised: 12/28/2018 Document Reviewed: 10/12/2016 Elsevier Patient Education  2020 [place].   Vaginal Delivery  Vaginal delivery means that you give birth by pushing your baby out of your birth canal (vagina). A team of health care providers will help you before, during, and after vaginal delivery. Birth experiences are unique for every woman and every pregnancy, and birth experiences vary depending on where you choose to give birth. What happens when I arrive at the birth center or hospital? Once you are in labor and have been admitted into the hospital or birth center, your health care provider may:  Review your pregnancy history and any concerns that you have.  Insert an IV into one of your veins. This may be used to give you fluids and medicines.  Check your blood pressure, pulse, temperature, and heart rate (vital signs).  Check whether your bag of water (amniotic sac) has broken (ruptured).  Talk with you about your birth plan and discuss pain control options. Monitoring Your health care provider may monitor your contractions (uterine monitoring) and your baby's heart rate (fetal monitoring). You may need to be monitored:  Often, but not continuously (intermittently).  All the time or for long periods at a time (continuously). Continuous monitoring may be needed if: ? You are taking certain medicines, such as medicine to relieve pain or make your contractions stronger. ? You have pregnancy or labor complications. Monitoring may be done by:  Placing a special stethoscope or a handheld monitoring device on your abdomen to check your baby's heartbeat and to check for contractions.  Placing monitors on your abdomen (external monitors) to record your baby's heartbeat and the frequency and length of contractions.  Placing monitors inside your uterus through your vagina  (internal monitors) to record your baby's heartbeat and the frequency, length, and strength of your contractions. Depending on the type of monitor, it may remain in your uterus  or on your baby's head until birth.  Telemetry. This is a type of continuous monitoring that can be done with external or internal monitors. Instead of having to stay in bed, you are able to move around during telemetry. Physical exam Your health care provider may perform frequent physical exams. This may include:  Checking how and where your baby is positioned in your uterus.  Checking your cervix to determine: ? Whether it is thinning out (effacing). ? Whether it is opening up (dilating). What happens during labor and delivery?  Normal labor and delivery is divided into the following three stages: Stage 1  This is the longest stage of labor.  This stage can last for hours or days.  Throughout this stage, you will feel contractions. Contractions generally feel mild, infrequent, and irregular at first. They get stronger, more frequent (about every 2-3 minutes), and more regular as you move through this stage.  This stage ends when your cervix is completely dilated to 4 inches (10 cm) and completely effaced. Stage 2  This stage starts once your cervix is completely effaced and dilated and lasts until the delivery of your baby.  This stage may last from 20 minutes to 2 hours.  This is the stage where you will feel an urge to push your baby out of your vagina.  You may feel stretching and burning pain, especially when the widest part of your baby's head passes through the vaginal opening (crowning).  Once your baby is delivered, the umbilical cord will be clamped and cut. This usually occurs after waiting a period of 1-2 minutes after delivery.  Your baby will be placed on your bare chest (skin-to-skin contact) in an upright position and covered with a warm blanket. Watch your baby for feeding cues, like  rooting or sucking, and help the baby to your breast for his or her first feeding. Stage 3  This stage starts immediately after the birth of your baby and ends after you deliver the placenta.  This stage may take anywhere from 5 to 30 minutes.  After your baby has been delivered, you will feel contractions as your body expels the placenta and your uterus contracts to control bleeding. What can I expect after labor and delivery?  After labor is over, you and your baby will be monitored closely until you are ready to go home to ensure that you are both healthy. Your health care team will teach you how to care for yourself and your baby.  You and your baby will stay in the same room (rooming in) during your hospital stay. This will encourage early bonding and successful breastfeeding.  You may continue to receive fluids and medicines through an IV.  Your uterus will be checked and massaged regularly (fundal massage).  You will have some soreness and pain in your abdomen, vagina, and the area of skin between your vaginal opening and your anus (perineum).  If an incision was made near your vagina (episiotomy) or if you had some vaginal tearing during delivery, cold compresses may be placed on your episiotomy or your tear. This helps to reduce pain and swelling.  You may be given a squirt bottle to use instead of wiping when you go to the bathroom. To use the squirt bottle, follow these steps: ? Before you urinate, fill the squirt bottle with warm water. Do not use hot water. ? After you urinate, while you are sitting on the toilet, use the squirt bottle to rinse the area  around your urethra and vaginal opening. This rinses away any urine and blood. ? Fill the squirt bottle with clean water every time you use the bathroom.  It is normal to have vaginal bleeding after delivery. Wear a sanitary pad for vaginal bleeding and discharge. Summary  Vaginal delivery means that you will give birth by  pushing your baby out of your birth canal (vagina).  Your health care provider may monitor your contractions (uterine monitoring) and your baby's heart rate (fetal monitoring).  Your health care provider may perform a physical exam.  Normal labor and delivery is divided into three stages.  After labor is over, you and your baby will be monitored closely until you are ready to go home. This information is not intended to replace advice given to you by your health care provider. Make sure you discuss any questions you have with your health care provider. Document Revised: 10/11/2017 Document Reviewed: 10/11/2017 Elsevier Patient Education  [place].   Pain Relief During Labor and Delivery Many things can cause pain during labor and delivery, including:  Pressure on bones and ligaments due to the baby moving through the pelvis.  Stretching of tissues due to the baby moving through the birth canal.  Muscle tension due to anxiety or nervousness.  The uterus tightening (contracting) and relaxing to help move the baby. There are many ways to deal with the pain of labor and delivery. They include:  Taking prenatal classes. Taking these classes helps you know what to expect during your baby's birth. What you learn will increase your confidence and decrease your anxiety.  Practicing relaxation techniques or doing relaxing activities, such as: ? Focused breathing. ? Meditation. ? Visualization. ? Aroma therapy. ? Listening to your favorite music. ? Hypnosis.  Taking a warm shower or bath (hydrotherapy). This may: ? Provide comfort and relaxation. ? Lessen your perception of pain. ? Decrease the amount of pain medicine needed. ? Decrease the length of labor.  Getting a massage or counterpressure on your back.  Applying warm packs or ice packs.  Changing positions often, moving around, or using a birthing ball.  Getting: ? Pain medicine through an IV or injection into a  muscle. ? Pain medicine inserted into your spinal column. ? Injections of sterile water just under the skin on your lower back (intradermal injections). ? Laughing gas (nitrous oxide). Discuss your pain control options with your health care provider during your prenatal visits. Explore the options offered by your hospital or birth center. What kinds of medicine are available? There are two kinds of medicines that can be used to relieve pain during labor and delivery:  Analgesics. These medicines decrease pain without causing you to lose feeling or the ability to move your muscles.  Anesthetics. These medicines block feeling in the body and can decrease your ability to move freely. Both of these kinds of medicine can cause minor side effects, such as nausea, trouble concentrating, and sleepiness. They can also decrease the baby's heart rate before birth and affect the baby's breathing rate after birth. For this reason, health care providers are careful about when and how much medicine is given. What are specific medicines and procedures that provide pain relief? Local Anesthetics Local anesthetics are used to numb a small area of the body. They may be used along with another kind of anesthetic or used to numb the nerves of the vagina, cervix, and perineum during the second stage of labor. General Anesthetics General anesthetics cause you  to lose consciousness so you do not feel pain. They are usually only used for an emergency cesarean delivery. General anesthetics are given through an IV tube and a mask. Pudendal Block A pudendal block is a form of local anesthetic. It may be used to relieve the pain associated with pushing or stretching of the perineum at the time of delivery or to further numb the perineum. A pudendal block is done by injecting numbing medicine through the vaginal wall into a nerve in the pelvis. Epidural Analgesia Epidural analgesia is given through a flexible IV catheter that  is inserted into the lower back. Numbing medicine is delivered continuously to the area near your spinal column nerves (epidural space). After having this type of analgesia, you may be able to move your legs but you most likely will not be able to walk. Depending on the amount of medicine given, you may lose all feeling in the lower half of your body, or you may retain some level of sensation, including the urge to push. Epidural analgesia can be used to provide pain relief for a vaginal birth. Spinal Block A spinal block is similar to epidural analgesia, but the medicine is injected into the spinal fluid instead of the epidural space. A spinal block is only given once. It starts to relieve pain quickly, but the pain relief lasts only 1-6 hours. Spinal blocks can be used for cesarean deliveries. Combined Spinal-Epidural (CSE) Block A CSE block combines the effects of a spinal block and epidural analgesia. The spinal block works quickly to block all pain. The epidural analgesia provides continuous pain relief, even after the effects of the spinal block have worn off. This information is not intended to replace advice given to you by your health care provider. Make sure you discuss any questions you have with your health care provider. Document Revised: 08/19/2017 Document Reviewed: 01/28/2016 Elsevier Patient Education  [place].   Augmentation of Labor  Augmentation of labor is when steps are taken to stimulate and strengthen contractions of the uterus during labor. This may be done when contractions have slowed down or stopped, delaying progress of labor and delivery of the baby. Before beginning augmentation of labor, your health care provider will evaluate your condition, your baby's condition, the size and position of your baby, and the size of your birth canal. What are some reasons for labor augmentation? Augmentation of labor may be needed when:  You are in labor but your  contractions are weak or irregular.  You are in labor but your contractions have stopped. What methods are used for labor augmentation? Labor augmentation may be done by:  Giving medicine that stimulates contractions (oxytocin). This is given through an IV tube that is inserted into one of your veins.  Breaking the fluid-filled sac that surrounds the fetus (amniotic sac). What are the risks associated with labor augmentation? Some risks of labor augmentation include:  Too much stimulation of the contractions, resulting in continuous, prolonged, or very strong contractions.  Increased risk of infection for you and your baby.  Tearing (rupture) of the uterus.  Breaking off (abruption) of the placenta.  Increased risk of cesarean, forceps, or vacuum delivery.  Excessive bleeding after delivery (postpartum hemorrhage).  Death of the baby (fetal death). What are some reasons for not doing labor augmentation? Augmentation of labor should not be done if:  The baby is too big for the birth canal. This can be confirmed with an ultrasound.  The umbilical cord drops  in front of the baby's head or breech part (prolapsed cord).  You have had a cesarean delivery and you had a vertical incision or you do not know what type of incision you had.  You have had surgery on or into your uterus.  You have an active herpes outbreak.  You have cervical cancer.  The placenta blocks the opening of the cervix (placenta previa) or you have other condition that is blocking the cervix or vaginal outlet.  The baby is lying sideways.  Your pelvis is will not permit the passage of the baby.  You are carrying more than two babies. Summary  Augmentation of labor is when steps are taken to stimulate and strengthen contractions of the uterus during labor. This may be done when contractions have slowed down or stopped, delaying progress of labor and delivery of the baby.  Labor augmentation may be done  using medicine to stimulate contractions (oxytocin) or by breaking the fluid-filled sac that surrounds the fetus (amniotic sac).  Labor should not be augmented if you have had a cesarean delivery and you had a vertical incision or you do not know what type of incision you had. This information is not intended to replace advice given to you by your health care provider. Make sure you discuss any questions you have with your health care provider. Document Revised: 07/03/2019 Document Reviewed: 10/11/2016 Elsevier Patient Education  2020 [employer].

## 2020-06-05 ENCOUNTER — Ambulatory Visit (INDEPENDENT_AMBULATORY_CARE_PROVIDER_SITE_OTHER): Payer: Medicaid Other | Admitting: Obstetrics and Gynecology

## 2020-06-05 ENCOUNTER — Other Ambulatory Visit: Payer: Self-pay

## 2020-06-05 VITALS — BP 120/80 | Wt 297.0 lb

## 2020-06-05 DIAGNOSIS — O0991 Supervision of high risk pregnancy, unspecified, first trimester: Secondary | ICD-10-CM | POA: Diagnosis not present

## 2020-06-05 DIAGNOSIS — O99213 Obesity complicating pregnancy, third trimester: Secondary | ICD-10-CM

## 2020-06-05 DIAGNOSIS — Z3A39 39 weeks gestation of pregnancy: Secondary | ICD-10-CM | POA: Diagnosis not present

## 2020-06-05 LAB — FETAL NONSTRESS TEST

## 2020-06-05 LAB — POCT URINALYSIS DIPSTICK OB
Glucose, UA: NEGATIVE
POC,PROTEIN,UA: NEGATIVE

## 2020-06-05 NOTE — Progress Notes (Signed)
Routine Prenatal Care Visit  Subjective  Emily Payne is a 29 y.o. G3P1011 at [redacted]w[redacted]d being seen today for ongoing prenatal care.  She is currently monitored for the following issues for this high-risk pregnancy and has Obesity affecting pregnancy; Dandruff; Depression; Generalized anxiety disorder with panic attacks; S/P laparoscopic surgery; Supervision of high risk pregnancy in first trimester; and BMI 37.0-37.9, adult on their problem list.  ----------------------------------------------------------------------------------- Patient reports no complaints.   Contractions: Not present. Vag. Bleeding: None.  Movement: Present. Denies leaking of fluid.  ----------------------------------------------------------------------------------- The following portions of the patient's history were reviewed and updated as appropriate: allergies, current medications, past family history, past medical history, past social history, past surgical history and problem list. Problem list updated.   Objective  Blood pressure 120/80, weight 297 lb (134.7 kg), last menstrual period 09/01/2019. Pregravid weight 258 lb (117 kg) Total Weight Gain 39 lb (17.7 kg)  Body mass index is 42.62 kg/m.  Urinalysis:      Fetal Status: Fetal Heart Rate (bpm): 135   Movement: Present     General:  Alert, oriented and cooperative. Patient is in no acute distress.  Skin: Skin is warm and dry. No rash noted.   Cardiovascular: Normal heart rate noted  Respiratory: Normal respiratory effort, no problems with respiration noted  Abdomen: Soft, gravid, appropriate for gestational age. Pain/Pressure: Present     Pelvic:  Cervical exam performed        Extremities: Normal range of motion.     ental Status: Normal mood and affect. Normal behavior. Normal judgment and thought content.   Baseline: 135 Variability: moderate Accelerations: present Decelerations: absent Tocometry: N/A The patient was monitored for 30 minutes,  fetal heart rate tracing was deemed reactive, category I tracing,  CPT [id number]   Assessment   29 y.o. G3P1011 at [redacted]w[redacted]d by  06/07/2020, by Last Menstrual Period presenting for routine prenatal visit  Plan   Pregnancy#3 Problems (from 09/01/19 to present)    Problem Noted Resolved   BMI 37.0-37.9, adult 01/23/2020 by Conard Novak, MD No   Supervision of high risk pregnancy in first trimester 11/14/2019 by Natale Milch, MD No   Overview Addendum 04/18/2020 10:45 AM by [name], CNM     Nursing Staff Provider  Office Location  [facility] Dating   LMP=7wk [place]  Language  English Anatomy US  Incomplete, suboptimal scan with MFM, close exam after birth advised  Flu Vaccine   Genetic Screen  NIPS:normal XX  TDaP vaccine   given 7/16 Hgb A1C or  GTT Early :102 Third trimester :   Rhogam  03/07/20   LAB RESULTS   Feeding Plan  breast Blood Type A/Negative/-- (02/24 1026)   Contraception  Antibody Negative (02/24 1026)  Circumcision  Rubella 7.10 (02/24 1026)  Pediatrician   RPR Non Reactive (02/24 1026)   Support Person  HBsAg Negative (02/24 1026)   Prenatal Classes  HIV Non Reactive (02/24 1026)    Varicella immune  BTL Consent  GBS  (For PCN allergy, check sensitivities)        VBAC Consent Desires TOLAC Pap  06/2017 NIL    Hgb Electro      CF      SMA               Previous Version   Obesity affecting pregnancy 07/04/2012 by Delbert Phenix, NP No   Overview Signed 10/11/2018  5:36 PM by Kerman Passey, MD  Referred to nutritionist -- did not attend Jan 2020          Gestational age appropriate obstetric precautions including but not limited to vaginal bleeding, contractions, leaking of fluid and fetal movement were reviewed in detail with the patient.    Return in about 1 week (around 06/12/2020) for ROB (has scheduled with [facility]).  Vena Austria, MD, Evern Core [facility], [facility] 06/05/2020, 10:12 AM

## 2020-06-09 ENCOUNTER — Inpatient Hospital Stay
Admission: EM | Admit: 2020-06-09 | Discharge: 2020-06-10 | DRG: 806 | Disposition: A | Discharge status: Home / Self Care | Payer: Medicaid Other | Attending: Obstetrics and Gynecology | Admitting: Obstetrics and Gynecology

## 2020-06-09 ENCOUNTER — Inpatient Hospital Stay: Payer: Medicaid Other | Admitting: Anesthesiology

## 2020-06-09 ENCOUNTER — Encounter: Payer: Self-pay | Admitting: Obstetrics and Gynecology

## 2020-06-09 ENCOUNTER — Other Ambulatory Visit: Payer: Self-pay

## 2020-06-09 DIAGNOSIS — O480 Post-term pregnancy: Secondary | ICD-10-CM | POA: Diagnosis not present

## 2020-06-09 DIAGNOSIS — Z20822 Contact with and (suspected) exposure to covid-19: Secondary | ICD-10-CM | POA: Diagnosis present

## 2020-06-09 DIAGNOSIS — O34219 Maternal care for unspecified type scar from previous cesarean delivery: Secondary | ICD-10-CM | POA: Diagnosis not present

## 2020-06-09 DIAGNOSIS — O99214 Obesity complicating childbirth: Secondary | ICD-10-CM | POA: Diagnosis present

## 2020-06-09 DIAGNOSIS — O26893 Other specified pregnancy related conditions, third trimester: Secondary | ICD-10-CM | POA: Diagnosis present

## 2020-06-09 DIAGNOSIS — Z3A40 40 weeks gestation of pregnancy: Secondary | ICD-10-CM

## 2020-06-09 LAB — PROTEIN / CREATININE RATIO, URINE
Creatinine, Urine: 118 mg/dL
Protein Creatinine Ratio: 0.66 mg/mg{creat} — ABNORMAL HIGH (ref 0.00–0.15)
Total Protein, Urine: 78 mg/dL

## 2020-06-09 LAB — CBC
HCT: 36.5 % (ref 36.0–46.0)
Hemoglobin: 12.4 g/dL (ref 12.0–15.0)
MCH: 29.6 pg (ref 26.0–34.0)
MCHC: 34 g/dL (ref 30.0–36.0)
MCV: 87.1 fL (ref 80.0–100.0)
Platelets: 270 10*3/uL (ref 150–400)
RBC: 4.19 MIL/uL (ref 3.87–5.11)
RDW: 13 % (ref 11.5–15.5)
WBC: 15 10*3/uL — ABNORMAL HIGH (ref 4.0–10.5)
nRBC: 0 % (ref 0.0–0.2)

## 2020-06-09 LAB — COMPREHENSIVE METABOLIC PANEL
ALT: 13 U/L (ref 0–44)
AST: 29 U/L (ref 15–41)
Albumin: 2.6 g/dL — ABNORMAL LOW (ref 3.5–5.0)
Alkaline Phosphatase: 125 U/L (ref 38–126)
Anion gap: 10 (ref 5–15)
BUN: 9 mg/dL (ref 6–20)
CO2: 20 mmol/L — ABNORMAL LOW (ref 22–32)
Calcium: 8.2 mg/dL — ABNORMAL LOW (ref 8.9–10.3)
Chloride: 102 mmol/L (ref 98–111)
Creatinine, Ser: 0.72 mg/dL (ref 0.44–1.00)
GFR calc Af Amer: >60 mL/min (ref >60)
GFR calc non Af Amer: >60 mL/min (ref >60)
Glucose, Bld: 122 mg/dL — ABNORMAL HIGH (ref 70–99)
Potassium: 3.8 mmol/L (ref 3.5–5.1)
Sodium: 132 mmol/L — ABNORMAL LOW (ref 135–145)
Total Bilirubin: 0.8 mg/dL (ref 0.3–1.2)
Total Protein: 6.1 g/dL — ABNORMAL LOW (ref 6.5–8.1)

## 2020-06-09 LAB — SARS CORONAVIRUS 2 BY RT PCR (HOSPITAL ORDER, PERFORMED IN ~~LOC~~ HOSPITAL LAB): SARS Coronavirus 2: NEGATIVE

## 2020-06-09 LAB — TYPE AND SCREEN
ABO/RH(D): A NEG
Antibody Screen: NEGATIVE

## 2020-06-09 LAB — GLUCOSE, CAPILLARY: Glucose-Capillary: 90 mg/dL (ref 70–99)

## 2020-06-09 LAB — RPR: RPR Ser Ql: NONREACTIVE

## 2020-06-09 MED ORDER — DIBUCAINE (PERIANAL) 1 % EX OINT: 1.0000 "application " | TOPICAL_OINTMENT | CUTANEOUS | Status: DC | PRN

## 2020-06-09 MED ORDER — METHYLERGONOVINE MALEATE 0.2 MG/ML IJ SOLN
0.2000 mg | Freq: Once | INTRAMUSCULAR | Status: AC
  Administered 2020-06-09: 0.2 mg via INTRAMUSCULAR

## 2020-06-09 MED ORDER — OXYTOCIN-SODIUM CHLORIDE 30-0.9 UT/500ML-% IV SOLN
INTRAVENOUS | Status: AC
  Filled 2020-06-09: qty 1000

## 2020-06-09 MED ORDER — DIPHENHYDRAMINE HCL 25 MG PO CAPS: 25.0000 mg | ORAL_CAPSULE | Freq: Four times a day (QID) | ORAL | Status: DC | PRN

## 2020-06-09 MED ORDER — EPHEDRINE 5 MG/ML INJ: 10.0000 mg | INTRAVENOUS | Status: DC | PRN

## 2020-06-09 MED ORDER — COCONUT OIL OIL
1.0000 "application " | TOPICAL_OIL | Status: DC | PRN
  Administered 2020-06-10: 1 via TOPICAL
  Filled 2020-06-09: qty 120

## 2020-06-09 MED ORDER — LIDOCAINE HCL (PF) 1 % IJ SOLN: 30.0000 mL | INTRAMUSCULAR | Status: DC | PRN

## 2020-06-09 MED ORDER — PHENYLEPHRINE 40 MCG/ML (10ML) SYRINGE FOR IV PUSH (FOR BLOOD PRESSURE SUPPORT): 80.0000 ug | PREFILLED_SYRINGE | INTRAVENOUS | Status: DC | PRN

## 2020-06-09 MED ORDER — ONDANSETRON HCL 4 MG PO TABS
4.0000 mg | ORAL_TABLET | ORAL | Status: DC | PRN
  Filled 2020-06-09: qty 1

## 2020-06-09 MED ORDER — OXYCODONE HCL 5 MG PO TABS: 5.0000 mg | ORAL_TABLET | ORAL | Status: DC | PRN

## 2020-06-09 MED ORDER — MISOPROSTOL 200 MCG PO TABS
ORAL_TABLET | ORAL | Status: AC
  Filled 2020-06-09: qty 4

## 2020-06-09 MED ORDER — CARBOPROST TROMETHAMINE 250 MCG/ML IM SOLN
INTRAMUSCULAR | Status: AC
  Filled 2020-06-09: qty 1

## 2020-06-09 MED ORDER — OXYTOCIN-SODIUM CHLORIDE 30-0.9 UT/500ML-% IV SOLN
2.5000 [IU]/h | INTRAVENOUS | Status: DC
  Administered 2020-06-09: 2.5 [IU]/h via INTRAVENOUS

## 2020-06-09 MED ORDER — ACETAMINOPHEN 325 MG PO TABS: 650.0000 mg | ORAL_TABLET | ORAL | Status: DC | PRN

## 2020-06-09 MED ORDER — SOD CITRATE-CITRIC ACID 500-334 MG/5ML PO SOLN: 30.0000 mL | ORAL | Status: DC | PRN

## 2020-06-09 MED ORDER — OXYTOCIN 10 UNIT/ML IJ SOLN
INTRAMUSCULAR | Status: AC
  Filled 2020-06-09: qty 2

## 2020-06-09 MED ORDER — WITCH HAZEL-GLYCERIN EX PADS
1.0000 "application " | MEDICATED_PAD | CUTANEOUS | Status: DC | PRN
  Administered 2020-06-10: 1 via TOPICAL
  Filled 2020-06-09: qty 100

## 2020-06-09 MED ORDER — LACTATED RINGERS IV SOLN: INTRAVENOUS | Status: DC

## 2020-06-09 MED ORDER — CARBOPROST TROMETHAMINE 250 MCG/ML IM SOLN
250.0000 ug | Freq: Once | INTRAMUSCULAR | Status: AC
  Administered 2020-06-09: 250 ug via INTRAMUSCULAR

## 2020-06-09 MED ORDER — FENTANYL 2.5 MCG/ML W/ROPIVACAINE 0.15% IN NS 100 ML EPIDURAL (ARMC)
12.0000 mL/h | EPIDURAL | Status: DC
  Administered 2020-06-09 (×2): 12 mL/h via EPIDURAL
  Filled 2020-06-09: qty 100

## 2020-06-09 MED ORDER — LIDOCAINE-EPINEPHRINE (PF) 1.5 %-1:200000 IJ SOLN
INTRAMUSCULAR | Status: DC | PRN
  Administered 2020-06-09: 3 mL via EPIDURAL

## 2020-06-09 MED ORDER — LACTATED RINGERS IV SOLN: 500.0000 mL | INTRAVENOUS | Status: DC | PRN

## 2020-06-09 MED ORDER — GENTAMICIN SULFATE 40 MG/ML IJ SOLN
1.5000 mg/kg | Freq: Once | INTRAVENOUS | Status: AC
  Administered 2020-06-09: 200 mg via INTRAVENOUS
  Filled 2020-06-09: qty 5

## 2020-06-09 MED ORDER — LIDOCAINE HCL (PF) 1 % IJ SOLN
INTRAMUSCULAR | Status: AC
  Filled 2020-06-09: qty 30

## 2020-06-09 MED ORDER — LACTATED RINGERS IV SOLN
500.0000 mL | Freq: Once | INTRAVENOUS | Status: AC
  Administered 2020-06-09: 500 mL via INTRAVENOUS

## 2020-06-09 MED ORDER — ONDANSETRON HCL 4 MG/2ML IJ SOLN: 4.0000 mg | INTRAMUSCULAR | Status: DC | PRN

## 2020-06-09 MED ORDER — SIMETHICONE 80 MG PO CHEW: 80.0000 mg | CHEWABLE_TABLET | ORAL | Status: DC | PRN

## 2020-06-09 MED ORDER — DIPHENHYDRAMINE HCL 50 MG/ML IJ SOLN: 12.5000 mg | INTRAMUSCULAR | Status: DC | PRN

## 2020-06-09 MED ORDER — ONDANSETRON HCL 4 MG/2ML IJ SOLN
4.0000 mg | Freq: Four times a day (QID) | INTRAMUSCULAR | Status: DC | PRN
  Administered 2020-06-09: 4 mg via INTRAVENOUS
  Filled 2020-06-09: qty 2

## 2020-06-09 MED ORDER — SENNOSIDES-DOCUSATE SODIUM 8.6-50 MG PO TABS
2.0000 | ORAL_TABLET | ORAL | Status: DC
  Administered 2020-06-10: 2 via ORAL
  Filled 2020-06-09: qty 2

## 2020-06-09 MED ORDER — LOPERAMIDE HCL 2 MG PO CAPS
2.0000 mg | ORAL_CAPSULE | ORAL | Status: DC | PRN
  Administered 2020-06-09: 2 mg via ORAL
  Filled 2020-06-09 (×2): qty 1

## 2020-06-09 MED ORDER — MISOPROSTOL 200 MCG PO TABS
800.0000 ug | ORAL_TABLET | Freq: Once | ORAL | Status: AC
  Administered 2020-06-09: 800 ug via RECTAL

## 2020-06-09 MED ORDER — PRENATAL MULTIVITAMIN CH
1.0000 | ORAL_TABLET | Freq: Every day | ORAL | Status: DC
  Administered 2020-06-10: 1 via ORAL
  Filled 2020-06-09: qty 1

## 2020-06-09 MED ORDER — CLINDAMYCIN PHOSPHATE 900 MG/50ML IV SOLN
900.0000 mg | Freq: Once | INTRAVENOUS | Status: AC
  Administered 2020-06-09: 900 mg via INTRAVENOUS
  Filled 2020-06-09: qty 50

## 2020-06-09 MED ORDER — BUTORPHANOL TARTRATE 1 MG/ML IJ SOLN
1.0000 mg | INTRAMUSCULAR | Status: DC | PRN
  Administered 2020-06-09: 1 mg via INTRAVENOUS
  Filled 2020-06-09: qty 1

## 2020-06-09 MED ORDER — IBUPROFEN 600 MG PO TABS
600.0000 mg | ORAL_TABLET | Freq: Four times a day (QID) | ORAL | Status: DC
  Administered 2020-06-09 – 2020-06-10 (×2): 600 mg via ORAL
  Filled 2020-06-09 (×3): qty 1

## 2020-06-09 MED ORDER — LIDOCAINE HCL (PF) 1 % IJ SOLN
INTRAMUSCULAR | Status: DC | PRN
  Administered 2020-06-09: 1 mL via SUBCUTANEOUS

## 2020-06-09 MED ORDER — OXYTOCIN-SODIUM CHLORIDE 30-0.9 UT/500ML-% IV SOLN
1.0000 m[IU]/min | INTRAVENOUS | Status: DC
  Administered 2020-06-09: 2 m[IU]/min via INTRAVENOUS

## 2020-06-09 MED ORDER — SODIUM CHLORIDE 0.9 % IV SOLN
INTRAVENOUS | Status: DC | PRN
  Administered 2020-06-09 (×2): 5 mL via EPIDURAL

## 2020-06-09 MED ORDER — ACETAMINOPHEN 325 MG PO TABS
650.0000 mg | ORAL_TABLET | ORAL | Status: DC | PRN
  Administered 2020-06-10: 650 mg via ORAL
  Filled 2020-06-09: qty 2

## 2020-06-09 MED ORDER — MISOPROSTOL 25 MCG QUARTER TABLET: 25.0000 ug | ORAL_TABLET | ORAL | Status: DC | PRN

## 2020-06-09 MED ORDER — BENZOCAINE-MENTHOL 20-0.5 % EX AERO
1.0000 "application " | INHALATION_SPRAY | CUTANEOUS | Status: DC | PRN
  Administered 2020-06-10: 1 via TOPICAL
  Filled 2020-06-09: qty 56

## 2020-06-09 MED ORDER — OXYTOCIN BOLUS FROM INFUSION
333.0000 mL | Freq: Once | INTRAVENOUS | Status: AC
  Administered 2020-06-09: 333 mL via INTRAVENOUS

## 2020-06-09 MED ORDER — OXYCODONE HCL 5 MG PO TABS: 10.0000 mg | ORAL_TABLET | ORAL | Status: DC | PRN

## 2020-06-09 MED ORDER — AMMONIA AROMATIC IN INHA
RESPIRATORY_TRACT | Status: AC
  Filled 2020-06-09: qty 10

## 2020-06-09 MED ORDER — FENTANYL 2.5 MCG/ML W/ROPIVACAINE 0.15% IN NS 100 ML EPIDURAL (ARMC)
EPIDURAL | Status: AC
  Filled 2020-06-09: qty 100

## 2020-06-09 MED ORDER — TERBUTALINE SULFATE 1 MG/ML IJ SOLN: 0.2500 mg | Freq: Once | INTRAMUSCULAR | Status: DC | PRN

## 2020-06-09 NOTE — Anesthesia Procedure Notes (Signed)
Epidural Patient location during procedure: OB Start time: 06/09/2020 4:21 AM End time: 06/09/2020 4:24 AM  Staffing Anesthesiologist: Lakisa Lotz, Cleda Mccreedy, MD Performed: anesthesiologist   Preanesthetic Checklist Completed: patient identified, IV checked, site marked, risks and benefits discussed, surgical consent, monitors and equipment checked, pre-op evaluation and timeout performed  Epidural Patient position: sitting Prep: ChloraPrep Patient monitoring: heart rate, continuous pulse ox and blood pressure Approach: midline Location: L3-L4 Injection technique: LOR saline  Needle:  Needle type: Tuohy  Needle gauge: 17 G Needle length: 9 cm and 9 Needle insertion depth: 9 cm Catheter type: closed end flexible Catheter size: 19 Gauge Catheter at skin depth: 15 cm Test dose: negative and 1.5% lidocaine with Epi 1:200 K  Assessment Sensory level: T10 Events: blood not aspirated, injection not painful, no injection resistance, no paresthesia and negative IV test  Additional Notes 1 attempt Pt. Evaluated and documentation done after procedure finished. Patient identified. Risks/Benefits/Options discussed with patient including but not limited to bleeding, infection, nerve damage, paralysis, failed block, incomplete pain control, headache, blood pressure changes, nausea, vomiting, reactions to medication both or allergic, itching and postpartum back pain. Confirmed with bedside nurse the patient's most recent platelet count. Confirmed with patient that they are not currently taking any anticoagulation, have any bleeding history or any family history of bleeding disorders. Patient expressed understanding and wished to proceed. All questions were answered. Sterile technique was used throughout the entire procedure. Please see nursing notes for vital signs. Test dose was given through epidural catheter and negative prior to continuing to dose epidural or start infusion. Warning signs of  high block given to the patient including shortness of breath, tingling/numbness in hands, complete motor block, or any concerning symptoms with instructions to call for help. Patient was given instructions on fall risk and not to get out of bed. All questions and concerns addressed with instructions to call with any issues or inadequate analgesia.   Patient tolerated the insertion well without immediate complications.Reason for block:procedure for pain

## 2020-06-09 NOTE — Discharge Summary (Signed)
OB Discharge Summary     Patient Name: Emily Payne DOB: 1990-11-09 MRN: [id number]  Date of admission: 06/09/2020 Delivering provider: [name], CNM  Date of Delivery: 06/09/2020  Date of discharge: 06/10/2020  Admitting diagnosis: Desires VBAC (vaginal birth after cesarean) trial [O34.219] Intrauterine pregnancy: [redacted]w[redacted]d     Secondary diagnosis: TOLAC     Discharge diagnosis: Term Pregnancy Delivered                                                                                                Post partum procedures:none  Augmentation: AROM and Pitocin  Complications: Hemorrhage>1043mL  Hospital course:  Onset of Labor With Vaginal Delivery      29 y.o. yo G3P1011 at [redacted]w[redacted]d was admitted in Active Labor on 06/09/2020. Patient had an uncomplicated labor course as follows:  Membrane Rupture Time/Date: 9:33 AM ,06/09/2020   Delivery Method:Vaginal, Spontaneous  Episiotomy: None  Lacerations:  2nd degreePerineal;Periurethral;Cervical  Patient had an uncomplicated postpartum course.  Yes.  Patient is discharged home in stable condition on 06/10/20.  Newborn Data: Birth date:06/09/2020  Birth time:3:55 PM  Gender:Female  Living status:Living  Apgars:6 ,7  Weight:3810 g   Physical exam  Vitals:   06/09/20 2224 06/09/20 2312 06/10/20 0409 06/10/20 0751  BP: 132/80 128/85 125/78 117/77  Pulse: 98 91 97 90  Resp:  18 18 20   Temp: 98.3 F (36.8 C) 98.4 F (36.9 C) 98.1 F (36.7 C) 98.5 F (36.9 C)  TempSrc: Oral Oral Oral Oral  SpO2: 98% 99% 100% 99%  Weight:      Height:       General: alert, cooperative and no distress Lochia: appropriate Uterine Fundus: firm Incision: N/A DVT Evaluation: No evidence of DVT seen on physical exam. Negative Homan's sign. No cords or calf tenderness.  Labs: Lab Results  Component Value Date   WBC 18.4 (H) 06/10/2020   HGB 10.9 (L) 06/10/2020   HCT 31.5 (L) 06/10/2020   MCV 85.6 06/10/2020   PLT 258 06/10/2020    Discharge  instruction: per After Visit Summary.  Medications:  Allergies as of 06/10/2020      Reactions   Sulfa Drugs Cross Reactors Anaphylaxis, Itching, Other (See Comments)   Flu like symptoms           Diet: routine diet  Activity: Advance as tolerated. Pelvic rest for 6 weeks.   Outpatient follow up:  Follow-up Information    [facility]. Schedule an appointment as soon as possible for a visit in 2 week(s).   Specialty: Obstetrics and Gynecology Why: mood check Contact information: [address] [place] [phone]                Postpartum contraception: Undecided Rhogam Given postpartum: no, her baby is rh negative Rubella vaccine given postpartum: no Varicella vaccine given postpartum: no TDaP given antepartum or postpartum: given antepartum  Newborn Data: Live born female [name] Birth Weight: 8 lb 6.4 oz (3810 g) APGAR: 6, 7  Newborn Delivery   Birth date/time: 06/09/2020 15:55:00 Delivery type: Vaginal, Spontaneous  Baby Feeding: Breast  Disposition:home with mother  SIGNED:  [name], [facility] 06/10/2020 10:51 AM

## 2020-06-09 NOTE — Lactation Note (Signed)
This note was copied from a baby's chart. 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  We were able to get her on the breast by gently lowering her chin to flip out her lower lip shortly after delivery.  For the 20:30 pm feeding, she was sucking on her lower lip, but when we got her to the breast she clamped down tightening her jaw and went to sleep at the breast.  Was able to get her to open her mouth, but not very wide and hand expressed a couple of drops of colostrum in her mouth and heard her swallow.   Explained feeding cues and encouraged mom to breast feed her any time she demonstrated feeding cues which she was willing to do.  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  She did say when it was too painful she would pump.  Encouraged mom to talk to her Pediatrician about the tongue and lip tie issue.  Discussed the breast feeding compications commonly caused by TT and LT.  Hand out given on area dentists that will evaluate and treat tongue and lip ties.  Hand out given on what to expect with feeding the first 4 days of life and reviewed normal newborn stomach size, supply and demand, adequate intake and out put, normal course of lactation, skin to skin, and routine newborn feeding patterns.  Lactation [employer] given and reviewed.  Encouraged mom to call with any questions, concerns or assistance.  Maternal Data Formula Feeding for Exclusion: No Has patient been taught Hand Expression?: Yes Does the patient have breastfeeding experience prior to this delivery?:  Yes  Feeding Feeding Type: Breast Fed  LATCH Score Latch: Too sleepy or reluctant, no latch achieved, no sucking elicited.  Audible Swallowing: A few with stimulation (Dripped a few drops in his mouth that he swallowed)  Type of Nipple: Everted at rest and after stimulation  Comfort (Breast/Nipple): Soft / non-tender  Hold (Positioning): Assistance needed to correctly position infant at breast and maintain latch.  LATCH Score: 6  Interventions Interventions: Breast feeding basics reviewed;Assisted with latch;Skin to skin;Breast massage;Hand express;Breast compression;Adjust position;Support pillows;Position options  Lactation Tools Discussed/Used WIC Program: Yes   Consult Status Consult Status: Follow-up Follow-up type: Call as needed    Louis Meckel 06/09/2020, 10:01 PM

## 2020-06-09 NOTE — Progress Notes (Signed)
Called for hemorrhage emergently.   When I arrived in the room the patient was in lithotomy position. She had been given cytotec 800 mcg, IM Hemabate, and IV pitocin. Bimanual exam showed a firm fundus, No products of conception on sweeping the uterine cavity. Normal intact placenta. Boggy lower uterine segment. IM methergine was given.  Uterine massage performed.  Bleeding improved shortly after my arrival. Cervix was inspected and walked with ring forceps. 4-5 cm cervical laceration at the 10 o'clock position. 1 cm laceration at the 6 o'clock position.  Cervical laceration at 11 o'clock repaired with 0-vicryl with running locked suture. Friable tissue at 6 o'clock, no repair needed.   Second degree perineal laceration and bilateral periurethral lacerations repaired with 2-0 and 3-0 vicryl  Bleeding remained stable. Uterus firm and below the umbilicus at the end of the repair.  Total EBL: 1695 cc   Will check CBC in AM, sooner if the patient  is symptomatic with tachycardia or hypotension.  Continue fundal checks.  Will give antibiotics for uterine massage and bimanual exam.   Adelene Idler MD, [facility], [facility] 06/09/2020 5:40 PM

## 2020-06-09 NOTE — OB Triage Note (Signed)
Pt is a G3P1 at 40.2 weeks. TOLAC (breech) intends to deliver vaginally. UCs started 9/19 and have been steadily increasing over the day. Pt denies complications with this pregnancy. Denies LOF Reports +FM

## 2020-06-09 NOTE — Anesthesia Preprocedure Evaluation (Signed)
Anesthesia Evaluation  Patient identified by MRN, date of birth, ID band Patient awake    Reviewed: Allergy & Precautions, H&P , NPO status , Patient's Chart, lab work & pertinent test results  History of Anesthesia Complications Negative for: history of anesthetic complications  Airway Mallampati: III  TM Distance: <3 FB Neck ROM: full    Dental  (+) Chipped   Pulmonary neg pulmonary ROS,    Pulmonary exam normal        Cardiovascular Exercise Tolerance: Good (-) hypertensionnegative cardio ROS Normal cardiovascular exam     Neuro/Psych  Headaches, PSYCHIATRIC DISORDERS    GI/Hepatic negative GI ROS,   Endo/Other  Morbid obesity  Renal/GU   negative genitourinary   Musculoskeletal   Abdominal   Peds  Hematology negative hematology ROS (+)   Anesthesia Other Findings TOLAC  Past Medical History: No date: Anxiety No date: Bipolar disorder (HCC)     Comment:  no med currently No date: Depression No date: Hypoglycemic syndrome 09/02/2018: Illicit drug use No date: Migraines     Comment:  otc med prn. with aura  No date: Poor fetal growth affecting management of mother in third  trimester  Past Surgical History: No date: abscess tooth 10/01/2014: CESAREAN SECTION; N/A     Comment:  Procedure: PRIMARY CESAREAN SECTION;  Surgeon: Adam Phenix, MD;  Location: [facility];  Service: Obstetrics;                Laterality: N/A; 06/27/2019: DIAGNOSTIC LAPAROSCOPY WITH REMOVAL OF ECTOPIC PREGNANCY;  N/A     Comment:  Procedure: DIAGNOSTIC LAPAROSCOPY , evacuation or               hemoperitoneum;  Surgeon: Natale Milch, MD;                Location: [facility] ORS;  Service: Gynecology;  Laterality:               N/A; 06/27/2019: LAPAROSCOPIC UNILATERAL SALPINGECTOMY; Right     Comment:  Procedure: LAPAROSCOPIC UNILATERAL SALPINGECTOMY;                Surgeon: Natale Milch, MD;  Location:  [facility] ORS;               Service: Gynecology;  Laterality: Right; No date: TONSILLECTOMY No date: WISDOM TOOTH EXTRACTION  BMI    Body Mass Index: 42.62 kg/m      Reproductive/Obstetrics (+) Pregnancy                             Anesthesia Physical Anesthesia Plan  ASA: III  Anesthesia Plan: Epidural   Post-op Pain Management:    Induction:   PONV Risk Score and Plan:   Airway Management Planned: Natural Airway  Additional Equipment:   Intra-op Plan:   Post-operative Plan:   Informed Consent: I have reviewed the patients History and Physical, chart, labs and discussed the procedure including the risks, benefits and alternatives for the proposed anesthesia with the patient or authorized representative who has indicated his/her understanding and acceptance.     Dental Advisory Given  Plan Discussed with: Anesthesiologist  Anesthesia Plan Comments: (Patient reports no bleeding problems and no anticoagulant use.   Patient consented for risks of anesthesia including but not limited to:  - adverse reactions to medications - risk of bleeding, infection and or  nerve damage from epidural that could lead to paralysis - risk of headache or failed epidural - nerve damage due to positioning - Damage to heart, brain, lungs, other parts of body or loss of life  Patient voiced understanding.)        Anesthesia Quick Evaluation

## 2020-06-09 NOTE — Progress Notes (Signed)
  Labor Progress Note   29 y.o. [id number] @ [redacted]w[redacted]d , admitted for  Pregnancy, Labor Management.   Subjective:  Continues to be comfortable and feels more pelvic pressure.  Objective:  BP (!) 143/74 (BP Location: Left Arm)   Pulse 83   Temp 98.4 F (36.9 C) (Oral)   Resp 18   Ht 5\' 10"  (1.778 m)   Wt 134.7 kg   LMP 09/01/2019 (Exact Date)   SpO2 97%   BMI 42.62 kg/m  Abd: gravid, ND, FHT present, mild tenderness on exam Extr: trace to 1+ bilateral pedal edema SVE: CERVIX: 10 cm dilated, 100 effaced, +2 station, small amount of bright red bleeding  EFM: FHR: 145 bpm, variability: moderate,  accelerations:  Present,  decelerations:  Absent Toco: Frequency: Every 2-3 minutes Labs: I have reviewed the patient's lab results.   Assessment & Plan:  14/08/2019 @ [redacted]w[redacted]d, admitted for  Pregnancy and Labor/Delivery Management  1. Pain management: epidural. 2. FWB: FHT category I.  3. ID: GBS negative 4. Labor management: labor down and recheck in 30 minutes  All discussed with patient, see orders   [redacted]w[redacted]d, CNM [facility] 06/09/2020  3:13 PM

## 2020-06-09 NOTE — Progress Notes (Signed)
°  Labor Progress Note   29 y.o. [id number] @ [redacted]w[redacted]d , admitted for  Pregnancy, Labor Management.   Subjective:  Comfortable with epidural  Objective:  BP (!) 141/78 (BP Location: Left Arm)    Pulse 76    Temp 97.9 F (36.6 C) (Oral)    Resp 16    Ht 5\' 10"  (1.778 m)    Wt 134.7 kg    LMP 09/01/2019 (Exact Date)    SpO2 93%    BMI 42.62 kg/m  Abd: gravid, ND, FHT present, mild tenderness on exam Extr: trace to 1+ bilateral pedal edema SVE: CERVIX: 6 cm dilated, 80 effaced, -2,-1 station, cervical sweep AROM: copious meconium stained  EFM: FHR: 125 bpm, variability: moderate,  accelerations:  Present,  decelerations:  Absent Toco: Frequency: Every 4-7 minutes Labs: I have reviewed the patient's lab results.   Assessment & Plan:  14/08/2019 @ [redacted]w[redacted]d, admitted for  Pregnancy and Labor/Delivery Management  1. Pain management: epidural. 2. FWB: FHT category I.  3. ID: GBS negative 4. Labor management: observe contraction pattern following AROM/sweep, start pitocin as needed  All discussed with patient, see orders   [redacted]w[redacted]d, CNM [facility] 06/09/2020  9:45 AM

## 2020-06-09 NOTE — Progress Notes (Signed)
Obstetric H&P   Chief Complaint: Contractions  Prenatal Care Provider: [facility]  History of Present Illness: 29 y.o. Payne [redacted]w[redacted]d by 06/07/2020, by Last Menstrual Period presenting to L&D with contractions.  Pregnancy has been notable for history of prior cesarean secondary to breech presentation, obesity Body mass index is 42.62 kg/m.  +FM, no LOF, no VB, +contractions.  Contractions increased this evening, she had her membranes stripped 06/06/2019 was a tight 3cm in clinic at that time  Pregravid weight 117 kg Total Weight Gain 17.7 kg  Pregnancy#3 Problems (from 09/01/19 to present)    Problem Noted Resolved   BMI 37.0-37.9, adult 01/23/2020 by Conard Novak, MD No   Supervision of high risk pregnancy in first trimester 11/14/2019 by Natale Milch, MD No   Overview Addendum 04/18/2020 10:45 AM by [name], CNM     Nursing Staff Provider  Office Location  [facility] Dating   LMP=7wk [place]  Language  English Anatomy US  Incomplete, suboptimal scan with MFM, close exam after birth advised  Flu Vaccine   Genetic Screen  NIPS:normal XX  TDaP vaccine   given 7/16 Hgb A1C or  GTT Early :102 Third trimester : 132  Rhogam  03/07/20   LAB RESULTS   Feeding Plan  breast Blood Type A/Negative/-- (02/24 1026)   Contraception  Antibody Negative (02/24 1026)  Circumcision  Rubella 7.10 (02/24 1026)  Pediatrician   RPR Non Reactive (02/24 1026)   Support Person  HBsAg Negative (02/24 1026)   Prenatal Classes  HIV Non Reactive (02/24 1026)    Varicella immune  BTL Consent  GBS  (For PCN allergy, check sensitivities)        VBAC Consent Desires TOLAC Pap  06/2017 NIL    Hgb Electro      CF      SMA               Previous Version   Obesity affecting pregnancy 07/04/2012 by Delbert Phenix, NP No   Overview Signed 10/11/2018  5:36 PM by Kerman Passey, MD    Referred to nutritionist -- did not attend Jan 2020          Review of Systems: 10 point review of systems  negative unless otherwise noted in HPI  Past Medical History: Patient Active Problem List   Diagnosis Date Noted  . Desires VBAC (vaginal birth after cesarean) trial 06/09/2020  . BMI 37.0-37.9, adult 01/23/2020  . Supervision of high risk pregnancy in first trimester 11/14/2019     Nursing Staff Provider  Office Location  [facility] Dating   LMP=7wk [place]  Language  English Anatomy US  Incomplete, suboptimal scan with MFM, close exam after birth advised  Flu Vaccine   Genetic Screen  NIPS:normal XX  TDaP vaccine   given 7/16 Hgb A1C or  GTT Early :102 Third trimester : 132  Rhogam  03/07/20   LAB RESULTS   Feeding Plan  breast Blood Type A/Negative/-- (02/24 1026)   Contraception  Antibody Negative (02/24 1026)  Circumcision  Rubella 7.10 (02/24 1026)  Pediatrician   RPR Non Reactive (02/24 1026)   Support Person  HBsAg Negative (02/24 1026)   Prenatal Classes  HIV Non Reactive (02/24 1026)    Varicella immune  BTL Consent  GBS  (For PCN allergy, check sensitivities)        VBAC Consent Desires TOLAC Pap  06/2017 NIL    Hgb Electro      CF  SMA            . S/P laparoscopic surgery 06/28/2019  . Generalized anxiety disorder with panic attacks 08/31/2018  . Depression 02/21/2015  . Dandruff 06/22/2013  . Obesity affecting pregnancy 07/04/2012    Referred to nutritionist -- did not attend Jan 2020     Past Surgical History: Past Surgical History:  Procedure Laterality Date  . abscess tooth    . CESAREAN SECTION N/A 10/01/2014   Procedure: PRIMARY CESAREAN SECTION;  Surgeon: Adam PhenixJames G Arnold, MD;  Location: [facility];  Service: Obstetrics;  Laterality: N/A;  . DIAGNOSTIC LAPAROSCOPY WITH REMOVAL OF ECTOPIC PREGNANCY N/A 06/27/2019   Procedure: DIAGNOSTIC LAPAROSCOPY , evacuation or hemoperitoneum;  Surgeon: Natale MilchSchuman, Christanna R, MD;  Location: [facility] OR[facility];  Service: Gynecology;  Laterality: N/A;  . LAPAROSCOPIC UNILATERAL SALPINGECTOMY Right 06/27/2019   Procedure: LAPAROSCOPIC  UNILATERAL SALPINGECTOMY;  Surgeon: Natale MilchSchuman, Christanna R, MD;  Location: [facility] ORS;  Service: Gynecology;  Laterality: Right;  . TONSILLECTOMY    . WISDOM TOOTH EXTRACTION      Past Obstetric History: # 1 - Date: None, Sex: None, Weight: None, GA: None, Delivery: None, Apgar1: None, Apgar5: None, Living: None, Birth Comments: None  # 2 - Date: 10/01/14, Sex: Female, Weight: 3100 g, GA: 6358w0d, Delivery: C-Section, Low Transverse, Apgar1: 9, Apgar5: 9, Living: Living, Birth Comments: None  # 3 - Date: None, Sex: None, Weight: None, GA: None, Delivery: None, Apgar1: None, Apgar5: None, Living: None, Birth Comments: None   Past Gynecologic History:  Family History: Family History  Problem Relation Age of Onset  . Ovarian cysts Mother   . Stroke Maternal Grandmother   . Cancer Maternal Grandmother 27       uterine cancer  . Lung cancer Paternal Grandfather   . Diabetes Maternal Great-grandmother   . Lung cancer Maternal Great-grandmother     Social History: Social History   Socioeconomic History  . Marital status: Married    Spouse name: Viviann SpareSteven  . Number of children: 3  . Years of education: 5617  . Highest education level: Bachelor's degree (e.g., BA, AB, BS)  Occupational History  . Not on file  Tobacco Use  . Smoking status: Never Smoker  . Smokeless tobacco: Never Used  Vaping Use  . Vaping Use: Never used  Substance and Sexual Activity  . Alcohol use: Not Currently    Alcohol/week: 0.0 standard drinks    Comment: rare but none with pregnancy  . Drug use: No  . Sexual activity: Yes    Partners: Male    Birth control/protection: None  Other Topics Concern  . Not on file  Social History Narrative   G0- engaged.   [place] [employer] but also attends [employer] for [employer].   Has two step children- ages 7314 and 829 that permanently stays with her. Patient also has a biological 494 yr old son.   Social Determinants of Health   Financial Resource Strain:   . Difficulty of  Paying Living Expenses: Not on file  Food Insecurity:   . Worried About Programme researcher, broadcasting/film/videounning Out of Food in the Last Year: Not on file  . Ran Out of Food in the Last Year: Not on file  Transportation Needs:   . Lack of Transportation (Medical): Not on file  . Lack of Transportation (Non-Medical): Not on file  Physical Activity:   . Days of Exercise per Week: Not on file  . Minutes of Exercise per Session: Not on file  Stress:   .  Feeling of Stress : Not on file  Social Connections:   . Frequency of Communication with Friends and Family: Not on file  . Frequency of Social Gatherings with Friends and Family: Not on file  . Attends Religious Services: Not on file  . Active Member of Clubs or Organizations: Not on file  . Attends Banker Meetings: Not on file  . Marital Status: Not on file  Intimate Partner Violence:   . Fear of Current or Ex-Partner: Not on file  . Emotionally Abused: Not on file  . Physically Abused: Not on file  . Sexually Abused: Not on file    Medications: Prior to Admission medications   Medication Sig Start Date End Date Taking? Authorizing Provider  acetaminophen (TYLENOL) 500 MG tablet Take 2 tablets (1,000 mg total) by mouth every 6 (six) hours. 06/28/19   Schuman, Jaquelyn Bitter, MD  clindamycin (CLINDAGEL) 1 % gel Apply to affected area 2 times daily 05/30/20 05/30/21  Natale Milch, MD  Prenatal Vit-Fe Fumarate-FA (PRENATAL MULTIVITAMIN) TABS tablet Take 1 tablet by mouth daily at 12 noon.    [provider]    Allergies: Allergies  Allergen Reactions  . Sulfa Drugs Cross Reactors Anaphylaxis, Itching and Other (See Comments)    Flu like symptoms    Physical Exam: Vitals: Blood pressure 131/81, pulse 87, temperature 97.7 F (36.5 C), resp. rate 18, height 5\' 10"  (1.778 m), weight 134.7 kg, last menstrual period 09/01/2019. Body mass index is 42.62 kg/m.   Urine Dip Protein: N/A  FHT: 130, moderate, +accels, no decels Toco:  q2-26min  General: NAD HEENT: normocephalic, anicteric Pulmonary: No increased work of breathing Cardiovascular: RRR, distal pulses 2+ Abdomen: Gravid, non-tender Leopolds: vtx Genitourinary:  Dilation: 4 Effacement (%): 80 Station: -1 Presentation: Vertex Exam by:: JDaley  Extremities: no edema, erythema, or tenderness Neurologic: Grossly intact Psychiatric: mood appropriate, affect full  Labs: No results found for this or any previous visit (from the past 24 hour(s)).  Assessment: 29 y.o. Payne 109w2d by 06/07/2020, by Last Menstrual Period presenting with contractions, history of prior cesarean section desiring TOLAC  Plan: 1) TOLAC - 29 y.o. Payne at [redacted]w[redacted]d with Estimated Date of Delivery: 06/07/20 was seen today in office to discuss trial of labor after cesarean section (TOLAC) versus elective repeat cesarean delivery (ERCD). The following risks were discussed with the patient.  Risk of uterine rupture at term is 0.78 percent with TOLAC and 0.22 percent with ERCD. 1 in 10 uterine ruptures will result in neonatal death or neurological injury. The benefits of a trial of labor after cesarean (TOLAC) resulting in a vaginal birth after cesarean (VBAC) include the following: shorter length of hospital stay and postpartum recovery (in most cases); fewer complications, such as postpartum fever, wound or uterine infection, thromboembolism (blood clots in the leg or lung), need for blood transfusion and fewer neonatal breathing problems. The risks of an attempted VBAC or TOLAC include the following: Risk of failed trial of labor after cesarean (TOLAC) without a vaginal birth after cesarean (VBAC) resulting in repeat cesarean delivery (RCD) in about 20 to 40 percent of women who attempt VBAC.  Her individualized success rate using the MFMU VBAC risk calculator is 63.4%.   Risk of rupture of uterus resulting in an emergency cesarean delivery. The risk of uterine rupture may be related in part  to the type of uterine incision made during the first cesarean delivery. A previous transverse uterine incision has the lowest risk of rupture (  0.2 to 1.5 percent risk). Vertical or T-shaped uterine incisions have a higher risk of uterine rupture (4 to 9 percent risk)The risk of fetal death is very low with both VBAC and elective repeat cesarean delivery (ERCD), but the likelihood of fetal death is higher with VBAC than with ERCD. Maternal death is very rare with either type of delivery. The risks of an elective repeat cesarean delivery (ERCD) were reviewed with the patient including but not limited to: 10/998 risk of uterine rupture which could have serious consequences, bleeding which may require transfusion; infection which may require antibiotics; injury to bowel, bladder or other surrounding organs (bowel, bladder, ureters); injury to the fetus; need for additional procedures including hysterectomy in the event of a life-threatening hemorrhage; thromboembolic phenomenon; abnormal placentation; incisional problems; death and other postoperative or anesthesia complications.    In addition we discussed that our collective office practice is to allow patient's who desire to attempt TOLAC to go into labor naturally.  There is some limited data that rupture rate may increase past [redacted] weeks gestation, but it is reasonable for women who are strongly committed to [facility] to continue pregnancy into the 41st week.  Medical indications necessetating early delivery may arise during the course of any pregnancy.  Given the contraindication on the use of prostaglandins for use in cervical ripening,  recommendation would be to proceed with repeat cesarean for delivery for patient's with unfavorable cervix (low Bishops score) who reach 41 weeks or who otherwise have a medical indication for early delivery.   These risks and benefits are summarized on the consent form, which was reviewed with the patient during the visit.  All  her questions answered and she signed a consent indicating a preference for TOLAC/ERCD. A copy of the consent was given to the patient.   Vena Austria, MD, [facility] - [facility]   2) Fetus - cat I tracing  3) PNL - Blood type A/Negative/-- (02/24 1026) / Anti-bodyscreen Negative (02/24 1026) / Rubella 7.10 (02/24 1026) / Varicella Immune / RPR Non Reactive (06/18 1101) / HBsAg Negative (02/24 1026) / HIV Non Reactive (06/18 1101) / 1-hr OGTT 132 / GBS Negative/-- (08/25 0949)  4) Immunization History -  Immunization History  Administered Date(s) Administered  . Influenza,inj,Quad PF,6+ Mos 06/22/2013, 06/21/2014  . Influenza-Unspecified 07/19/2017, 08/29/2019  . Tdap 07/18/2014, 04/04/2020    5) Disposition -   Vena Austria, MD, [facility], [facility] 06/09/2020, 1:19 AM

## 2020-06-09 NOTE — Progress Notes (Signed)
  Labor Progress Note   29 y.o. [id number] @ [redacted]w[redacted]d , admitted for  Pregnancy, Labor Management.   Subjective:  Beginning to feel a little pressure with contractions. She has been sitting up.  Position change to side with peanut ball.  Objective:  BP 129/71 (BP Location: Left Arm)   Pulse 88   Temp 98.1 F (36.7 C) (Oral)   Resp 17   Ht 5\' 10"  (1.778 m)   Wt 134.7 kg   LMP 09/01/2019 (Exact Date)   SpO2 91%   BMI 42.62 kg/m  Abd: gravid, ND, FHT present, mild tenderness on exam Extr: trace to 1+ bilateral pedal edema SVE: CERVIX: 7 cm dilated, 90 effaced, -1 station, bloody show  EFM: FHR: 130 bpm, variability: moderate,  accelerations:  Present,  decelerations:  Absent Toco: Frequency: Every 2-3 minutes Labs: I have reviewed the patient's lab results.   Assessment & Plan:  14/08/2019 @ [redacted]w[redacted]d, admitted for  Pregnancy and Labor/Delivery Management  1. Pain management: epidural. 2. FWB: FHT category I.  3. ID: GBS negative 4. Labor management: continue pitocin  All discussed with patient, see orders   [redacted]w[redacted]d, CNM [facility] 06/09/2020  12:56 PM

## 2020-06-10 LAB — CBC
HCT: 31.5 % — ABNORMAL LOW (ref 36.0–46.0)
Hemoglobin: 10.9 g/dL — ABNORMAL LOW (ref 12.0–15.0)
MCH: 29.6 pg (ref 26.0–34.0)
MCHC: 34.6 g/dL (ref 30.0–36.0)
MCV: 85.6 fL (ref 80.0–100.0)
Platelets: 258 10*3/uL (ref 150–400)
RBC: 3.68 MIL/uL — ABNORMAL LOW (ref 3.87–5.11)
RDW: 13.1 % (ref 11.5–15.5)
WBC: 18.4 10*3/uL — ABNORMAL HIGH (ref 4.0–10.5)
nRBC: 0 % (ref 0.0–0.2)

## 2020-06-10 MED ORDER — IBUPROFEN 600 MG PO TABS
600.0000 mg | ORAL_TABLET | Freq: Four times a day (QID) | ORAL | 0 refills | Status: DC
Start: 2020-06-10 — End: 2021-08-11

## 2020-06-10 NOTE — Anesthesia Postprocedure Evaluation (Signed)
Anesthesia Post Note  Patient: Emily Payne  Procedure(s) Performed: AN AD HOC LABOR EPIDURAL  Patient location during evaluation: Mother Baby Anesthesia Type: Epidural Level of consciousness: oriented and awake and alert Pain management: pain level controlled Vital Signs Assessment: post-procedure vital signs reviewed and stable Respiratory status: spontaneous breathing and respiratory function stable Cardiovascular status: blood pressure returned to baseline and stable Postop Assessment: no headache, no backache, no apparent nausea or vomiting and able to ambulate Anesthetic complications: no   No complications documented.   Last Vitals:  Vitals:   06/09/20 2312 06/10/20 0409  BP: 128/85 125/78  Pulse: 91 97  Resp: 18 18  Temp: 36.9 C 36.7 C  SpO2: 99% 100%    Last Pain:  Vitals:   06/10/20 0409  TempSrc: Oral  PainSc:                  [name]

## 2020-06-10 NOTE — H&P (Signed)
Obstetric H&P   Chief Complaint: Contractions  Prenatal Care Provider: [facility]  History of Present Illness: 29 y.o. G3P1011 [redacted]w[redacted]d by 06/07/2020, by Last Menstrual Period presenting to L&D with contractions.  Pregnancy has been notable for history of prior cesarean secondary to breech presentation, obesity Body mass index is 42.62 kg/m.  +FM, no LOF, no VB, +contractions.  Contractions increased this evening, she had her membranes stripped 06/06/2019 was a tight 3cm in clinic at that time  Pregravid weight 117 kg Total Weight Gain 17.7 kg        Pregnancy#3 Problems (from 09/01/19 to present)    Problem Noted Resolved   BMI 37.0-37.9, adult 01/23/2020 by Conard Novak, MD No   Supervision of high risk pregnancy in first trimester 11/14/2019 by Natale Milch, MD No   Overview Addendum 04/18/2020 10:45 AM by [name], CNM          Nursing Staff Provider  Office Location  [facility] Dating   LMP=7wk [place]  Language  English Anatomy US  Incomplete, suboptimal scan with MFM, close exam after birth advised  Flu Vaccine   Genetic Screen  NIPS:normal XX  TDaP vaccine   given 7/16 Hgb A1C or  GTT Early :102 Third trimester : 132  Rhogam  03/07/20   LAB RESULTS   Feeding Plan  breast Blood Type A/Negative/-- (02/24 1026)   Contraception  Antibody Negative (02/24 1026)  Circumcision  Rubella 7.10 (02/24 1026)  Pediatrician   RPR Non Reactive (02/24 1026)   Support Person  HBsAg Negative (02/24 1026)   Prenatal Classes  HIV Non Reactive (02/24 1026)    Varicella immune  BTL Consent  GBS  (For PCN allergy, check sensitivities)        VBAC Consent Desires TOLAC Pap  06/2017 NIL    Hgb Electro      CF      SMA               Previous Version   Obesity affecting pregnancy 07/04/2012 by Delbert Phenix, NP No   Overview Signed 10/11/2018  5:36 PM by Kerman Passey, MD    Referred to nutritionist -- did not attend Jan 2020            Review of Systems: 10 point review of systems negative unless otherwise noted in HPI  Past Medical History:      Patient Active Problem List   Diagnosis Date Noted  . Desires VBAC (vaginal birth after cesarean) trial 06/09/2020  . BMI 37.0-37.9, adult 01/23/2020  . Supervision of high risk pregnancy in first trimester 11/14/2019          Nursing Staff Provider  Office Location  [facility] Dating   LMP=7wk [place]  Language  English Anatomy US  Incomplete, suboptimal scan with MFM, close exam after birth advised  Flu Vaccine   Genetic Screen  NIPS:normal XX  TDaP vaccine   given 7/16 Hgb A1C or  GTT Early :102 Third trimester : 132  Rhogam  03/07/20   LAB RESULTS   Feeding Plan  breast Blood Type A/Negative/-- (02/24 1026)   Contraception  Antibody Negative (02/24 1026)  Circumcision  Rubella 7.10 (02/24 1026)  Pediatrician   RPR Non Reactive (02/24 1026)   Support Person  HBsAg Negative (02/24 1026)   Prenatal Classes  HIV Non Reactive (02/24 1026)    Varicella immune  BTL Consent  GBS  (For PCN allergy, check sensitivities)  VBAC Consent Desires TOLAC Pap  06/2017 NIL    Hgb Electro      CF      SMA            . S/P laparoscopic surgery 06/28/2019  . Generalized anxiety disorder with panic attacks 08/31/2018  . Depression 02/21/2015  . Dandruff 06/22/2013  . Obesity affecting pregnancy 07/04/2012    Referred to nutritionist -- did not attend Jan 2020     Past Surgical History:      Past Surgical History:  Procedure Laterality Date  . abscess tooth    . CESAREAN SECTION N/A 10/01/2014   Procedure: PRIMARY CESAREAN SECTION;  Surgeon: Adam Phenix, MD;  Location: [facility];  Service: Obstetrics;  Laterality: N/A;  . DIAGNOSTIC LAPAROSCOPY WITH REMOVAL OF ECTOPIC PREGNANCY N/A 06/27/2019   Procedure: DIAGNOSTIC LAPAROSCOPY , evacuation or hemoperitoneum;  Surgeon: Natale Milch, MD;  Location: [facility] ORS;   Service: Gynecology;  Laterality: N/A;  . LAPAROSCOPIC UNILATERAL SALPINGECTOMY Right 06/27/2019   Procedure: LAPAROSCOPIC UNILATERAL SALPINGECTOMY;  Surgeon: Natale Milch, MD;  Location: [facility] ORS;  Service: Gynecology;  Laterality: Right;  . TONSILLECTOMY    . WISDOM TOOTH EXTRACTION      Past Obstetric History: # 1 - Date: None, Sex: None, Weight: None, GA: None, Delivery: None, Apgar1: None, Apgar5: None, Living: None, Birth Comments: None  # 2 - Date: 10/01/14, Sex: Female, Weight: 3100 g, GA: [redacted]w[redacted]d, Delivery: C-Section, Low Transverse, Apgar1: 9, Apgar5: 9, Living: Living, Birth Comments: None  # 3 - Date: None, Sex: None, Weight: None, GA: None, Delivery: None, Apgar1: None, Apgar5: None, Living: None, Birth Comments: None   Past Gynecologic History:  Family History: Family History  Problem Relation Age of Onset  . Ovarian cysts Mother   . Stroke Maternal Grandmother   . Cancer Maternal Grandmother 27       uterine cancer  . Lung cancer Paternal Grandfather   . Diabetes Maternal Great-grandmother   . Lung cancer Maternal Great-grandmother     Social History: Social History        Socioeconomic History  . Marital status: Married    Spouse name: Viviann Spare  . Number of children: 3  . Years of education: 47  . Highest education level: Bachelor's degree (e.g., BA, AB, BS)  Occupational History  . Not on file  Tobacco Use  . Smoking status: Never Smoker  . Smokeless tobacco: Never Used  Vaping Use  . Vaping Use: Never used  Substance and Sexual Activity  . Alcohol use: Not Currently    Alcohol/week: 0.0 standard drinks    Comment: rare but none with pregnancy  . Drug use: No  . Sexual activity: Yes    Partners: Male    Birth control/protection: None  Other Topics Concern  . Not on file  Social History Narrative   G0- engaged.   Works at [employer] but also attends [employer] for [employer].   Has two step children- ages 65  and 38 that permanently stays with her. Patient also has a biological 81 yr old son.   Social Determinants of Health      Financial Resource Strain:   . Difficulty of Paying Living Expenses: Not on file  Food Insecurity:   . Worried About Programme researcher, broadcasting/film/video in the Last Year: Not on file  . Ran Out of Food in the Last Year: Not on file  Transportation Needs:   . Lack of Transportation (Medical): Not on file  .  Lack of Transportation (Non-Medical): Not on file  Physical Activity:   . Days of Exercise per Week: Not on file  . Minutes of Exercise per Session: Not on file  Stress:   . Feeling of Stress : Not on file  Social Connections:   . Frequency of Communication with Friends and Family: Not on file  . Frequency of Social Gatherings with Friends and Family: Not on file  . Attends Religious Services: Not on file  . Active Member of Clubs or Organizations: Not on file  . Attends BankerClub or Organization Meetings: Not on file  . Marital Status: Not on file  Intimate Partner Violence:   . Fear of Current or Ex-Partner: Not on file  . Emotionally Abused: Not on file  . Physically Abused: Not on file  . Sexually Abused: Not on file    Medications:        Prior to Admission medications   Medication Sig Start Date End Date Taking? Authorizing Provider  acetaminophen (TYLENOL) 500 MG tablet Take 2 tablets (1,000 mg total) by mouth every 6 (six) hours. 06/28/19   Schuman, Jaquelyn Bitterhristanna R, MD  clindamycin (CLINDAGEL) 1 % gel Apply to affected area 2 times daily 05/30/20 05/30/21  Natale MilchSchuman, Christanna R, MD  Prenatal Vit-Fe Fumarate-FA (PRENATAL MULTIVITAMIN) TABS tablet Take 1 tablet by mouth daily at 12 noon.    [provider]    Allergies:      Allergies  Allergen Reactions  . Sulfa Drugs Cross Reactors Anaphylaxis, Itching and Other (See Comments)    Flu like symptoms    Physical Exam: Vitals: Blood pressure 131/81, pulse 87, temperature 97.7 F (36.5 C), resp.  rate 18, height 5\' 10"  (1.778 m), weight 134.7 kg, last menstrual period 09/01/2019. Body mass index is 42.62 kg/m.   Urine Dip Protein: N/A  FHT: 130, moderate, +accels, no decels Toco: q2-493min  General: NAD HEENT: normocephalic, anicteric Pulmonary: No increased work of breathing Cardiovascular: RRR, distal pulses 2+ Abdomen: Gravid, non-tender Leopolds: vtx Genitourinary:  Dilation: 4 Effacement (%): 80 Station: -1 Presentation: Vertex Exam by:: JDaley  Extremities: no edema, erythema, or tenderness Neurologic: Grossly intact Psychiatric: mood appropriate, affect full  Labs: Lab Results Last 24 Hours  No results found for this or any previous visit (from the past 24 hour(s)).    Assessment: 29 y.o. G3P1011 754w2d by 06/07/2020, by Last Menstrual Period presenting with contractions, history of prior cesarean section desiring TOLAC  Plan: 1) TOLAC - 29 y.o. G3P1011 at 234w2d with Estimated Date of Delivery: 06/07/20 was seen today in office to discuss trial of labor after cesarean section (TOLAC) versus elective repeat cesarean delivery (ERCD). The following risks were discussed with the patient.  Risk of uterine rupture at term is 0.78 percent with TOLAC and 0.22 percent with ERCD. 1 in 10 uterine ruptures will result in neonatal death or neurological injury. The benefits of a trial of labor after cesarean (TOLAC) resulting in a vaginal birth after cesarean (VBAC) include the following: shorter length of hospital stay and postpartum recovery (in most cases); fewer complications, such as postpartum fever, wound or uterine infection, thromboembolism (blood clots in the leg or lung), need for blood transfusion and fewer neonatal breathing problems. The risks of an attempted VBAC or TOLAC include the following:  Risk of failed trial of labor after cesarean (TOLAC) without a vaginal birth after cesarean (VBAC) resulting in repeat cesarean delivery (RCD) in about 20 to 40  percent of women who attempt VBAC.  Her  individualized success rate using the MFMU VBAC risk calculator is 63.4%.    Risk of rupture of uterus resulting in an emergency cesarean delivery. The risk of uterine rupture may be related in part to the type of uterine incision made during the first cesarean delivery. A previous transverse uterine incision has the lowest risk of rupture (0.2 to 1.5 percent risk). Vertical or T-shaped uterine incisions have a higher risk of uterine rupture (4 to 9 percent risk)The risk of fetal death is very low with both VBAC and elective repeat cesarean delivery (ERCD), but the likelihood of fetal death is higher with VBAC than with ERCD. Maternal death is very rare with either type of delivery. The risks of an elective repeat cesarean delivery (ERCD) were reviewed with the patient including but not limited to: 10/998 risk of uterine rupture which could have serious consequences, bleeding which may require transfusion; infection which may require antibiotics; injury to bowel, bladder or other surrounding organs (bowel, bladder, ureters); injury to the fetus; need for additional procedures including hysterectomy in the event of a life-threatening hemorrhage; thromboembolic phenomenon; abnormal placentation; incisional problems; death and other postoperative or anesthesia complications.    In addition we discussed that our collective office practice is to allow patient's who desire to attempt TOLAC to go into labor naturally.  There is some limited data that rupture rate may increase past [redacted] weeks gestation, but it is reasonable for women who are strongly committed to [facility] to continue pregnancy into the 41st week.  Medical indications necessetating early delivery may arise during the course of any pregnancy.  Given the contraindication on the use of prostaglandins for use in cervical ripening,  recommendation would be to proceed with repeat cesarean for delivery for patient's with  unfavorable cervix (low Bishops score) who reach 41 weeks or who otherwise have a medical indication for early delivery.   These risks and benefits are summarized on the consent form, which was reviewed with the patient during the visit.  All her questions answered and she signed a consent indicating a preference for TOLAC/ERCD. A copy of the consent was given to the patient.   Vena Austria, MD, [facility] - [facility]   2) Fetus - cat I tracing  3) PNL - Blood type A/Negative/-- (02/24 1026) / Anti-bodyscreen Negative (02/24 1026) / Rubella 7.10 (02/24 1026) / Varicella Immune / RPR Non Reactive (06/18 1101) / HBsAg Negative (02/24 1026) / HIV Non Reactive (06/18 1101) / 1-hr OGTT 132 / GBS Negative/-- (08/25 0949)  4) Immunization History -      Immunization History  Administered Date(s) Administered  . Influenza,inj,Quad PF,6+ Mos 06/22/2013, 06/21/2014  . Influenza-Unspecified 07/19/2017, 08/29/2019  . Tdap 07/18/2014, 04/04/2020    5) Disposition -   Vena Austria, MD, [facility], [facility] 06/09/2020, 1:19 AM

## 2020-06-10 NOTE — Progress Notes (Signed)
Discharge instructions, prescriptions, education, and appointments given and explained. Pt verbalized understanding with no further questions. Pt and infant and all belongings wheeled to personal vehicle for d/c via staff.

## 2020-06-10 NOTE — Lactation Note (Signed)
This note was copied from a baby's chart. Lactation Consultation Note  Patient Name: Emily Payne [id number]'K Date: 06/10/2020 Reason for consult: Follow-up assessment;Mother's request;Term  Lactation follow-up. Mom attempting to feed with [name] in football hold, but stating she was not eager or showing interest. [name] was awake and alert, but non-cuing laying next to mom skin to skin with support pillows. LC adjusted baby slightly to be nose to nipple, assisted with hand expression onto tip of nipple, [name] had a few attempts to latch but was not overly eager. LC recommended some hand expression/collection for stimulation and milk removal and was able to removal approximately 91mL within minutes. While hand expressing, [name] became more alert, showing early cues. After a few attempts [name] latched and sustained the latch with strong rhythmic sucking pattern, and audible swallows. Mom did not feel pain or discomfort. Encouraged breast massage and compression with [name] at the breast to help with milk removal. Provided reassurance over frequency of feeds for HOL and output for HOL (<24hrs). Mom was encouraged to hand express opposite breast into vial if baby was content after ending on left breast. Encouraged to continue to call out for support or with any questions. Parents anticipate 24hr discharge later this afternoon.  Maternal Data Formula Feeding for Exclusion: No Has patient been taught Hand Expression?: Yes Does the patient have breastfeeding experience prior to this delivery?: Yes  Feeding Feeding Type: Breast Fed  LATCH Score Latch: Repeated attempts needed to sustain latch, nipple held in mouth throughout feeding, stimulation needed to elicit sucking reflex.  Audible Swallowing: Spontaneous and intermittent  Type of Nipple: Everted at rest and after stimulation  Comfort (Breast/Nipple): Soft / non-tender  Hold (Positioning): Assistance needed to correctly position  infant at breast and maintain latch.  LATCH Score: 8  Interventions Interventions: Breast feeding basics reviewed;Assisted with latch;Hand express;Adjust position;Support pillows  Lactation Tools Discussed/Used     Consult Status Consult Status: PRN Date: 06/10/20 Follow-up type: Call as needed    [name] 06/10/2020, 12:24 PM

## 2020-06-13 ENCOUNTER — Encounter: Payer: Medicaid Other | Admitting: Obstetrics and Gynecology

## 2020-08-01 ENCOUNTER — Ambulatory Visit (INDEPENDENT_AMBULATORY_CARE_PROVIDER_SITE_OTHER): Payer: Medicaid Other | Admitting: Advanced Practice Midwife

## 2020-08-01 ENCOUNTER — Other Ambulatory Visit: Payer: Self-pay

## 2020-08-01 ENCOUNTER — Other Ambulatory Visit (HOSPITAL_COMMUNITY)
Admission: RE | Admit: 2020-08-01 | Discharge: 2020-08-01 | Disposition: A | Discharge status: Home / Self Care | Payer: Medicaid Other | Source: Ambulatory Visit | Attending: Advanced Practice Midwife | Admitting: Advanced Practice Midwife

## 2020-08-01 ENCOUNTER — Encounter: Payer: Self-pay | Admitting: Advanced Practice Midwife

## 2020-08-01 DIAGNOSIS — Z124 Encounter for screening for malignant neoplasm of cervix: Secondary | ICD-10-CM | POA: Insufficient documentation

## 2020-08-01 DIAGNOSIS — Z30011 Encounter for initial prescription of contraceptive pills: Secondary | ICD-10-CM

## 2020-08-01 MED ORDER — NORETHINDRONE 0.35 MG PO TABS: 1.0000 | ORAL_TABLET | Freq: Every day | ORAL | 4 refills | Status: DC

## 2020-08-01 NOTE — Progress Notes (Signed)
Postpartum Visit  Chief Complaint:  Chief Complaint  Patient presents with   Post-op Follow-up    History of Present Illness: Patient is a 29 y.o. [id number] presents for postpartum visit. She has no concerns today.   Review the Delivery Report for details.  Date of delivery: 06/09/2020  Type of delivery: Vaginal delivery - Vacuum or forceps assisted  no Episiotomy No.  Laceration: cervical, 2nd degree, periurethral (all repaired by Dr Jerene Pitch)  Pregnancy or labor problems:  no Any problems since the delivery:  no  Newborn Details:  SINGLETON :  1. BabyGender female.  Birth weight: 8 pounds 6 ounces Maternal Details:  Breast or formula feeding: breastfeeding Intercourse: No  Contraception after delivery: POP prescribed today Any bowel or bladder issues: No  Post partum depression/anxiety noted:  no [place] Post-Partum Depression Score: 2 Date of last PAP: 3 years ago  no abnormalities   Review of Systems: Review of Systems  Constitutional: Negative for chills and fever.  HENT: Negative for congestion, ear discharge, ear pain, hearing loss, sinus pain and sore throat.   Eyes: Negative for blurred vision and double vision.  Respiratory: Negative for cough, shortness of breath and wheezing.   Cardiovascular: Negative for chest pain, palpitations and leg swelling.  Gastrointestinal: Negative for abdominal pain, blood in stool, constipation, diarrhea, heartburn, melena, nausea and vomiting.  Genitourinary: Negative for dysuria, flank pain, frequency, hematuria and urgency.  Musculoskeletal: Negative for back pain, joint pain and myalgias.  Skin: Negative for itching and rash.  Neurological: Negative for dizziness, tingling, tremors, sensory change, speech change, focal weakness, seizures, loss of consciousness, weakness and headaches.  Endo/Heme/Allergies: Negative for environmental allergies. Does not bruise/bleed easily.  Psychiatric/Behavioral: Negative for depression,  hallucinations, memory loss, substance abuse and suicidal ideas. The patient is not nervous/anxious and does not have insomnia.      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 with aura    Poor fetal growth affecting management of mother in third trimester     Past Surgical History:  Past Surgical History:  Procedure Laterality Date   abscess tooth     CESAREAN SECTION N/A 10/01/2014   Procedure: PRIMARY CESAREAN SECTION;  Surgeon: Adam Phenix, MD;  Location: [facility];  Service: Obstetrics;  Laterality: N/A;   DIAGNOSTIC LAPAROSCOPY WITH REMOVAL OF ECTOPIC PREGNANCY N/A 06/27/2019   Procedure: DIAGNOSTIC LAPAROSCOPY , evacuation or hemoperitoneum;  Surgeon: Natale Milch, MD;  Location: [facility] ORS;  Service: Gynecology;  Laterality: N/A;   LAPAROSCOPIC UNILATERAL SALPINGECTOMY Right 06/27/2019   Procedure: LAPAROSCOPIC UNILATERAL SALPINGECTOMY;  Surgeon: Natale Milch, MD;  Location: [facility] ORS;  Service: Gynecology;  Laterality: Right;   TONSILLECTOMY     WISDOM TOOTH EXTRACTION      Family History:  Family History  Problem Relation Age of Onset   Ovarian cysts Mother    Stroke Maternal Grandmother    Cancer Maternal Grandmother 27       uterine cancer   Lung cancer Paternal Grandfather    Diabetes Maternal Great-grandmother    Lung cancer Maternal Great-grandmother     Social History:  Social History   Socioeconomic History   Marital status: Married    Spouse name: Viviann Spare   Number of children: 3  Years of education: 48   Highest education level: Bachelor's degree (e.g., BA, AB, BS)  Occupational History   Not on file  Tobacco Use   Smoking status: Never Smoker   Smokeless tobacco: Never Used  Vaping Use   Vaping Use: Never used  Substance and Sexual  Activity   Alcohol use: Not Currently    Alcohol/week: 0.0 standard drinks    Comment: rare but none with pregnancy   Drug use: No   Sexual activity: Yes    Partners: Male    Birth control/protection: None  Other Topics Concern   Not on file  Social History Narrative   G0- engaged.   Works at [employer] but also attends [employer] for [employer].   Has two step children- ages 70 and 13 that permanently stays with her. Patient also has a biological 49 yr old son.   Social Determinants of Health   Financial Resource Strain:    Difficulty of Paying Living Expenses: Not on file  Food Insecurity:    Worried About Programme researcher, broadcasting/film/video in the Last Year: Not on file   [employer] of Food in the Last Year: Not on file  Transportation Needs:    Lack of Transportation (Medical): Not on file   Lack of Transportation (Non-Medical): Not on file  Physical Activity:    Days of Exercise per Week: Not on file   Minutes of Exercise per Session: Not on file  Stress:    Feeling of Stress : Not on file  Social Connections:    Frequency of Communication with Friends and Family: Not on file   Frequency of Social Gatherings with Friends and Family: Not on file   Attends Religious Services: Not on file   Active Member of Clubs or Organizations: Not on file   Attends Banker Meetings: Not on file   Marital Status: Not on file  Intimate Partner Violence:    Fear of Current or Ex-Partner: Not on file   Emotionally Abused: Not on file   Physically Abused: Not on file   Sexually Abused: Not on file    Allergies:  Allergies  Allergen Reactions   Sulfa Drugs Cross Reactors Anaphylaxis, Itching and Other (See Comments)    Flu like symptoms    Medications: Prior to Admission medications   Medication Sig Start Date End Date Taking? Authorizing Provider  acetaminophen (TYLENOL) 500 MG tablet Take 2 tablets (1,000 mg total) by mouth every 6 (six) hours. 06/28/19  Yes Schuman,  Christanna R, MD  clindamycin (CLINDAGEL) 1 % gel Apply to affected area 2 times daily 05/30/20 05/30/21 Yes Schuman, Christanna R, MD  ibuprofen (ADVIL) 600 MG tablet Take 1 tablet (600 mg total) by mouth every 6 (six) hours. 06/10/20  Yes [name], CNM  Prenatal Vit-Fe Fumarate-FA (PRENATAL MULTIVITAMIN) TABS tablet Take 1 tablet by mouth daily at 12 noon.   Yes [provider]  norethindrone (MICRONOR) 0.35 MG tablet Take 1 tablet (0.35 mg total) by mouth daily. 08/01/20   [name], CNM    Physical Exam Blood pressure 132/70, height 5\' 10"  (1.778 m), weight 274 lb 9.6 oz (124.6 kg), currently breastfeeding.    General: NAD HEENT: normocephalic, anicteric Pulmonary: No increased work of breathing Abdomen: NABS, soft, non-tender, non-distended.  Umbilicus without lesions.  No hepatomegaly, splenomegaly or masses palpable. No evidence of hernia. Genitourinary:  External: Normal external female genitalia.  Normal urethral meatus, normal Bartholin's and Skene's glands.    Vagina: Normal vaginal  mucosa, no evidence of prolapse.    Cervix: Grossly normal in appearance, lacerations healed, no CMT, no bleeding  Uterus: Non-enlarged, mobile, normal contour.    Adnexa: ovaries non-enlarged, no adnexal masses  Rectal: deferred Extremities: no edema, erythema, or tenderness Neurologic: Grossly intact Psychiatric: mood appropriate, affect full   Edinburgh Postnatal Depression Scale - 08/01/20 1014      Edinburgh Postnatal Depression Scale:  In the Past 7 Days   I have been able to laugh and see the funny side of things. 0    I have looked forward with enjoyment to things. 0    I have blamed myself unnecessarily when things went wrong. 0    I have been anxious or worried for no good reason. 1    I have felt scared or panicky for no good reason. 1    Things have been getting on top of me. 0    I have been so unhappy that I have had difficulty sleeping. 0    I have felt sad  or miserable. 0    I have been so unhappy that I have been crying. 0    The thought of harming myself has occurred to me. 0    Edinburgh Postnatal Depression Scale Total 2           Assessment: 29 y.o. [id number] presenting for 6 week postpartum visit  Plan: Problem List Items Addressed This Visit    None    Visit Diagnoses    6 weeks postpartum follow-up    -  Primary   Relevant Orders   Cytology - PAP   Encounter for initial prescription of contraceptive pills       Relevant Medications   norethindrone (MICRONOR) 0.35 MG tablet   Cervical cancer screening       Relevant Orders   Cytology - PAP       1) Contraception - Education given regarding options for contraception, as well as compatibility with breast feeding if applicable.  Patient plans on oral progesterone-only contraceptive for contraception.  2)  Pap - ASCCP guidelines and rational discussed.  ASCCP guidelines and rational discussed.  Patient opts for every 3 years screening interval  3) Patient underwent screening for postpartum depression with no signs of depression  4) Return in about 1 year (around 08/01/2021) for annual established gyn.    [name], CNM [facility], [facility] 08/01/2020, 10:32 AM

## 2020-08-05 LAB — CYTOLOGY - PAP: Diagnosis: NEGATIVE

## 2021-04-06 ENCOUNTER — Ambulatory Visit: Payer: Medicaid Other | Admitting: Family Medicine

## 2021-04-13 ENCOUNTER — Other Ambulatory Visit: Payer: Self-pay

## 2021-04-13 ENCOUNTER — Ambulatory Visit (INDEPENDENT_AMBULATORY_CARE_PROVIDER_SITE_OTHER): Payer: BC Managed Care – PPO | Admitting: Family Medicine

## 2021-04-13 ENCOUNTER — Encounter: Payer: Self-pay | Admitting: Family Medicine

## 2021-04-13 VITALS — BP 126/82 | HR 100 | Temp 99.1°F | Resp 16 | Ht 70.0 in | Wt 279.3 lb

## 2021-04-13 DIAGNOSIS — F411 Generalized anxiety disorder: Secondary | ICD-10-CM | POA: Diagnosis not present

## 2021-04-13 DIAGNOSIS — F410 Panic disorder [episodic paroxysmal anxiety] without agoraphobia: Secondary | ICD-10-CM

## 2021-04-13 MED ORDER — LORAZEPAM 0.5 MG PO TABS: 0.5000 mg | ORAL_TABLET | Freq: Every day | ORAL | 0 refills | Status: DC | PRN

## 2021-04-13 MED ORDER — ESCITALOPRAM OXALATE 10 MG PO TABS
10.0000 mg | ORAL_TABLET | Freq: Every day | ORAL | 0 refills | Status: DC
Start: 2021-04-13 — End: 2021-05-12

## 2021-04-13 NOTE — Progress Notes (Signed)
    SUBJECTIVE:   CHIEF COMPLAINT / HPI:   Anxiety, Depression - Medications: lexapro 20mg  - Taking: has been out for a few months.  - Counseling: no - Previous hospitalizations: no - FH of psych illness: yes, mom with anxiety/depression - Symptoms: panic attacks, excessive worrying - Current stressors: postpartum, 4 kids at home, husband changing jobs, financial difficulties - Coping Mechanisms: kids   OBJECTIVE:   BP 126/82   Pulse 100   Temp 99.1 F (37.3 C)   Resp 16   Ht 5\' 10"  (1.778 m)   Wt 279 lb 4.8 oz (126.7 kg)   LMP 03/21/2021   SpO2 98%   BMI 40.08 kg/m   Gen: well appearing, tearful Psych: appropriately dressed and well groomed. Thought process appropriate and non-tangential. Speech non-pressured.  GAD 7 : Generalized Anxiety Score 04/13/2021 09/27/2018  Nervous, Anxious, on Edge 3 1  Control/stop worrying 3 1  Worry too much - different things 3 0  Trouble relaxing 3 1  Restless 3 0  Easily annoyed or irritable 3 0  Afraid - awful might happen 3 1  Total GAD 7 Score 21 4  Anxiety Difficulty Not difficult at all Somewhat difficult    Depression screen [facility] 2/9 04/13/2021 03/10/2020 09/05/2019  Decreased Interest 1 0 1  Down, Depressed, Hopeless 1 0 1  PHQ - 2 Score 2 0 2  Altered sleeping 2 - 0  Tired, decreased energy 3 - 0  Change in appetite 2 - 0  Feeling bad or failure about yourself  2 - 0  Trouble concentrating 1 - 0  Moving slowly or fidgety/restless 0 - 0  Suicidal thoughts 0 - 0  PHQ-9 Score 12 - 2  Difficult doing work/chores Very difficult - Not difficult at all     ASSESSMENT/PLAN:   Generalized anxiety disorder with panic attacks Worsened since being out of meds for a few months, made worse by postpartum status and financial difficulties. GAD 21, PHQ 12. Will restart lexapro at starting dose with self-titration back to 20mg  after 2 weeks. Referral placed for counseling. Short term ativan for panic attacks provided until able to  reach therapeutic SSRI dose. F/u in 4 weeks.     03/12/2020, DO

## 2021-04-13 NOTE — Assessment & Plan Note (Signed)
Worsened since being out of meds for a few months, made worse by postpartum status and financial difficulties. GAD 21, PHQ 12. Will restart lexapro at starting dose with self-titration back to 20mg  after 2 weeks. Referral placed for counseling. Short term ativan for panic attacks provided until able to reach therapeutic SSRI dose. F/u in 4 weeks.

## 2021-04-13 NOTE — Patient Instructions (Signed)
It was great to see you!  Our plans for today:  - Take lexapro 10mg  for 2 weeks then increase to 2 pills daily (20 mg). - Come back in 1 month for follow up.   Take care and seek immediate care sooner if you develop any concerns.   Dr. 

## 2021-05-10 ENCOUNTER — Other Ambulatory Visit: Payer: Self-pay | Admitting: Family Medicine

## 2021-05-10 NOTE — Telephone Encounter (Signed)
Last RF 10/14/20 #45 too soon

## 2021-05-12 ENCOUNTER — Encounter: Payer: Self-pay | Admitting: Family Medicine

## 2021-05-12 ENCOUNTER — Ambulatory Visit: Payer: BC Managed Care – PPO | Admitting: Family Medicine

## 2021-05-12 ENCOUNTER — Other Ambulatory Visit: Payer: Self-pay

## 2021-05-12 VITALS — BP 122/84 | HR 93 | Temp 98.1°F | Resp 18 | Ht 70.0 in | Wt 277.0 lb

## 2021-05-12 DIAGNOSIS — F410 Panic disorder [episodic paroxysmal anxiety] without agoraphobia: Secondary | ICD-10-CM

## 2021-05-12 DIAGNOSIS — F411 Generalized anxiety disorder: Secondary | ICD-10-CM | POA: Diagnosis not present

## 2021-05-12 MED ORDER — ESCITALOPRAM OXALATE 20 MG PO TABS
20.0000 mg | ORAL_TABLET | Freq: Every day | ORAL | 3 refills | Status: DC
Start: 2021-05-12 — End: 2022-06-03

## 2021-05-12 NOTE — Patient Instructions (Signed)
It was great to see you!  Our plans for today:  - No changes to your medications. - Come back in 3 months, sooner if you need [place].  Take care and seek immediate care sooner if you develop any concerns.   Dr. Linwood Dibbles

## 2021-05-12 NOTE — Progress Notes (Signed)
    SUBJECTIVE:   CHIEF COMPLAINT / HPI:   Anxiety - Medications: lexapro 20mg  - previously on wellbutrin, sertraline - Taking: took 2 ativan.  - Counseling: not yet - Previous hospitalizations: no - FH of psych illness: yes, mom with anxiety/depression - Symptoms: a few panic attacks  - Current stressors: postpartum, 4 kids at home, husband changing jobs, financial difficulties - Coping Mechanisms: kids, being by herself.   GAD 7 : Generalized Anxiety Score 05/12/2021 04/13/2021 09/27/2018  Nervous, Anxious, on Edge 2 3 1   Control/stop worrying 1 3 1   Worry too much - different things 1 3 0  Trouble relaxing 2 3 1   Restless 1 3 0  Easily annoyed or irritable 1 3 0  Afraid - awful might happen 1 3 1   Total GAD 7 Score 9 21 4   Anxiety Difficulty Somewhat difficult Not difficult at all Somewhat difficult    Depression screen [facility] 2/9 05/12/2021 05/12/2021 04/13/2021  Decreased Interest 1 1 1   Down, Depressed, Hopeless 0 0 1  PHQ - 2 Score 1 1 2   Altered sleeping 2 - 2  Tired, decreased energy 3 - 3  Change in appetite 1 - 2  Feeling bad or failure about yourself  1 - 2  Trouble concentrating 1 - 1  Moving slowly or fidgety/restless 3 - 0  Suicidal thoughts 0 - 0  PHQ-9 Score 12 - 12  Difficult doing work/chores Somewhat difficult - Very difficult      OBJECTIVE:   BP 122/84   Pulse 93   Temp 98.1 F (36.7 C) (Oral)   Resp 18   Ht 5\' 10"  (1.778 m)   Wt 277 lb (125.6 kg)   SpO2 98%   BMI 39.75 kg/m   Gen: well appearing, in NAD Psych: pleasant, appropriately dressed and well groomed. Appropriate insight and judgement. Congruent thought process, speech nonpressured.  ASSESSMENT/PLAN:   Generalized anxiety disorder with panic attacks Much improved in symptoms and GAD scoring. Expect further improvement once reaches full therapeutic effect on current SSRI dosing. Appropriate ativan use. Instructed to call for counseling appt, referral previously generated. F/u in 3  months or sooner if symptoms not controlled after a few weeks.      , DO

## 2021-05-12 NOTE — Assessment & Plan Note (Signed)
Much improved in symptoms and GAD scoring. Expect further improvement once reaches full therapeutic effect on current SSRI dosing. Appropriate ativan use. Instructed to call for counseling appt, referral previously generated. F/u in 3 months or sooner if symptoms not controlled after a few weeks.

## 2021-07-08 ENCOUNTER — Other Ambulatory Visit: Payer: Self-pay

## 2021-07-08 ENCOUNTER — Encounter: Payer: Self-pay | Admitting: Obstetrics & Gynecology

## 2021-07-08 ENCOUNTER — Ambulatory Visit (INDEPENDENT_AMBULATORY_CARE_PROVIDER_SITE_OTHER): Payer: BC Managed Care – PPO | Admitting: Obstetrics & Gynecology

## 2021-07-08 VITALS — BP 126/84 | Ht 70.0 in | Wt 266.0 lb

## 2021-07-08 DIAGNOSIS — N926 Irregular menstruation, unspecified: Secondary | ICD-10-CM | POA: Diagnosis not present

## 2021-07-08 NOTE — Progress Notes (Signed)
HPI:      Emily Payne is a 30 y.o. [id number] who LMP was Patient's last menstrual period was 07/01/2021 (exact date)., presents today for a problem visit.  She complains of  missing a period then 2 weeks later having one; he also has had nausea and breast T, but neg uCG at home.  She also is concerned bc she had cervical laceration w delivery 1 year ago, and if that was a factor.  She has been bottle feeding and having reg cycles and not on POP since Jan 2022.    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 Also,  has a past surgical history that includes Tonsillectomy; Wisdom tooth extraction; abscess tooth; Cesarean section (N/A, 10/01/2014); Diagnostic laparoscopy with removal of ectopic pregnancy (N/A, 06/27/2019); and Laparoscopic unilateral salpingectomy (Right, 06/27/2019)., family history includes Cancer (age of onset: 4) in her maternal grandmother; Diabetes in her maternal great-grandmother; Lung cancer in her maternal great-grandmother and paternal grandfather; Ovarian cysts in her mother; Stroke in her maternal grandmother.,  reports that she has never smoked. She has never used smokeless tobacco. She reports that she does not currently use alcohol. She reports that she does not use drugs.  She  Current Outpatient Medications:    acetaminophen (TYLENOL) 500 MG tablet, Take 2 tablets (1,000 mg total) by mouth every 6 (six) hours., Disp: 100 tablet, Rfl: 1   escitalopram (LEXAPRO) 20 MG tablet, Take 1 tablet (20 mg total) by mouth daily. After 2 weeks, if tolerating, increase to 20mg  daily., Disp: 90 tablet, Rfl: 3   ibuprofen (ADVIL) 600 MG tablet, Take 1  tablet (600 mg total) by mouth every 6 (six) hours., Disp: 30 tablet, Rfl: 0   LORazepam (ATIVAN) 0.5 MG tablet, Take 1 tablet (0.5 mg total) by mouth daily as needed for anxiety., Disp: 15 tablet, Rfl: 0   norethindrone (MICRONOR) 0.35 MG tablet, Take 1 tablet (0.35 mg total) by mouth daily., Disp: 90 tablet, Rfl: 4  Also, is allergic to sulfa drugs cross reactors.  Review of Systems  Constitutional:  Negative for chills, fever and malaise/fatigue.  HENT:  Negative for congestion, sinus pain and sore throat.   Eyes:  Negative for blurred vision and pain.  Respiratory:  Negative for cough and wheezing.   Cardiovascular:  Negative for chest pain and leg swelling.  Gastrointestinal:  Negative for abdominal pain, constipation, diarrhea, heartburn, nausea and vomiting.  Genitourinary:  Negative for dysuria, frequency, hematuria and urgency.  Musculoskeletal:  Negative for back pain, joint pain, myalgias and neck pain.  Skin:  Negative for itching and rash.  Neurological:  Negative for dizziness, tremors and weakness.  Endo/Heme/Allergies:  Does not bruise/bleed easily.  Psychiatric/Behavioral:  Negative for depression. The patient is not nervous/anxious and does not have insomnia.    Objective: BP 126/84   Ht 5\' 10"  (1.778 m)   Wt 266 lb (120.7 kg)   LMP 07/01/2021 (Exact Date)   BMI 38.17 kg/m  Physical Exam Constitutional:      General: She is not in acute distress.    Appearance: She is well-developed.  Genitourinary:     Right Labia: No rash or tenderness.    Left Labia: No tenderness or rash.    No vaginal erythema  or bleeding.      Right Adnexa: not tender and no mass present.    Left Adnexa: not tender and no mass present.    No cervical motion tenderness, discharge, polyp or nabothian cyst.     Cervical exam comments: Well healed.     Uterus is not enlarged.     No uterine mass detected.    Pelvic exam was performed with patient in the lithotomy position.  HENT:      Head: Normocephalic and atraumatic.     Nose: Nose normal.  Abdominal:     General: There is no distension.     Palpations: Abdomen is soft.     Tenderness: There is no abdominal tenderness.  Musculoskeletal:        General: Normal range of motion.  Neurological:     Mental Status: She is alert and oriented to person, place, and time.     Cranial Nerves: No cranial nerve deficit.  Skin:    General: Skin is warm and dry.  Psychiatric:        Attention and Perception: Attention normal.        Mood and Affect: Mood and affect normal.        Speech: Speech normal.        Behavior: Behavior normal.        Thought Content: Thought content normal.        Judgment: Judgment normal.    ASSESSMENT/PLAN:  dysfunctional uterine bleeding  Problem List Items Addressed This Visit     Irregular menses    -  Primary   Relevant Orders   Beta hCG quant (ref lab)   Irreg cycle most likely, and not related to A PREGNANCY  Monitor cycles moving forward No birth control desired, actually desires pregnancy again soon Annual and PAP in 1-2 mos  Annamarie Major, MD, [facility], [facility] 07/08/2021  3:39 PM

## 2021-07-09 LAB — BETA HCG QUANT (REF LAB): hCG Quant: <1 m[IU]/mL

## 2021-08-11 ENCOUNTER — Ambulatory Visit (INDEPENDENT_AMBULATORY_CARE_PROVIDER_SITE_OTHER): Payer: BC Managed Care – PPO | Admitting: Family Medicine

## 2021-08-11 ENCOUNTER — Encounter: Payer: Self-pay | Admitting: Family Medicine

## 2021-08-11 ENCOUNTER — Other Ambulatory Visit: Payer: Self-pay

## 2021-08-11 VITALS — BP 108/60 | HR 96 | Temp 98.0°F | Resp 16 | Ht 70.0 in | Wt 262.1 lb

## 2021-08-11 DIAGNOSIS — F410 Panic disorder [episodic paroxysmal anxiety] without agoraphobia: Secondary | ICD-10-CM

## 2021-08-11 DIAGNOSIS — Z30011 Encounter for initial prescription of contraceptive pills: Secondary | ICD-10-CM

## 2021-08-11 DIAGNOSIS — Z5181 Encounter for therapeutic drug level monitoring: Secondary | ICD-10-CM

## 2021-08-11 DIAGNOSIS — F411 Generalized anxiety disorder: Secondary | ICD-10-CM

## 2021-08-11 MED ORDER — NORETHINDRONE 0.35 MG PO TABS: 1.0000 | ORAL_TABLET | Freq: Every day | ORAL | 4 refills | Status: DC

## 2021-08-11 MED ORDER — LORAZEPAM 0.5 MG PO TABS: 0.5000 mg | ORAL_TABLET | Freq: Every day | ORAL | 0 refills | Status: DC | PRN

## 2021-08-11 NOTE — Progress Notes (Signed)
Name: Emily Payne   MRN: [id number]    DOB: 09/23/1990   Date:08/11/2021       Progress Note  Chief Complaint  Patient presents with   Anxiety     Subjective:   Emily Payne is a 30 y.o. female, presents to clinic for follow-up on medications, depression, anxiety and panic disorder  She reports doing fairly well with Lexapro 20 mg daily, medications were started in August after her visit with Dr. Linwood Dibbles She feels it has helped her mood and decreased her anxiety and excessive worry but she does still occasionally have panic attacks, overall they are less than a few months ago PHQ-9 reviewed, mildly positive but significantly improved from a few months ago GAD-7 is positive and unchanged She does have low-dose Ativan for as needed use with severe panic attacks She has been unable to get established with a therapist and doing so causes additional stress and anxiety with her busy life and 4 kids Depression screen [facility] 2/9 08/11/2021 05/12/2021 05/12/2021  Decreased Interest 0 1 1  Down, Depressed, Hopeless 0 0 0  PHQ - 2 Score 0 1 1  Altered sleeping 1 2 -  Tired, decreased energy 3 3 -  Change in appetite 0 1 -  Feeling bad or failure about yourself  1 1 -  Trouble concentrating 0 1 -  Moving slowly or fidgety/restless 0 3 -  Suicidal thoughts 0 0 -  PHQ-9 Score 5 12 -  Difficult doing work/chores Somewhat difficult Somewhat difficult -   GAD 7 : Generalized Anxiety Score 08/11/2021 05/12/2021 04/13/2021 09/27/2018  Nervous, Anxious, on Edge 2 2 3 1   Control/stop worrying 1 1 3 1   Worry too much - different things 1 1 3  0  Trouble relaxing 2 2 3 1   Restless 1 1 3  0  Easily annoyed or irritable 1 1 3  0  Afraid - awful might happen 1 1 3 1   Total GAD 7 Score 9 9 21 4   Anxiety Difficulty Somewhat difficult Somewhat difficult Not difficult at all Somewhat difficult   She has a 57-year-old (45-month-old) and has been on norethindrone since her 6-week follow-up this has helped her  cycles and her menorrhagia, she has not been back to the OB/GYN and will run out of her medication soon.  She takes it continuously no missed doses or concerns of pregnancy, STI testing was done last year no new partners no GU complaints  Reviewed vital signs and blood pressure today no history of hypertension She does have a history of migraines which previously were severe with her menstrual cycles which were irregular and very heavy she was told not to take any birth control with estrogen BP Readings from Last 3 Encounters:  08/11/21 108/60  07/08/21 126/84  05/12/21 122/84   Her most recent labs were from her hospitalization and delivery of her child last September there were several complications and multiple lab abnormalities which were never followed up on including electrolyte derangement and anemia she reported heavy bleeding a large cervical laceration.  She has no concerns at this time and would like to do follow-up labs with her physical    Current Outpatient Medications:    escitalopram (LEXAPRO) 20 MG tablet, Take 1 tablet (20 mg total) by mouth daily. After 2 weeks, if tolerating, increase to 20mg  daily., Disp: 90 tablet, Rfl: 3   LORazepam (ATIVAN) 0.5 MG tablet, Take 1 tablet (0.5 mg total) by mouth daily as needed for anxiety., Disp: 15  tablet, Rfl: 0   acetaminophen (TYLENOL) 500 MG tablet, Take 2 tablets (1,000 mg total) by mouth every 6 (six) hours. (Patient not taking: Reported on 08/11/2021), Disp: 100 tablet, Rfl: 1   ibuprofen (ADVIL) 600 MG tablet, Take 1 tablet (600 mg total) by mouth every 6 (six) hours. (Patient not taking: Reported on 08/11/2021), Disp: 30 tablet, Rfl: 0   norethindrone (MICRONOR) 0.35 MG tablet, Take 1 tablet (0.35 mg total) by mouth daily. (Patient not taking: Reported on 08/11/2021), Disp: 90 tablet, Rfl: 4  Patient Active Problem List   Diagnosis Date Noted   Encounter for care or examination of lactating mother 06/09/2020   Postpartum care  following vaginal delivery 06/09/2020   BMI 37.0-37.9, adult 01/23/2020   Supervision of high risk pregnancy in first trimester 11/14/2019   S/P laparoscopic surgery 06/28/2019   Generalized anxiety disorder with panic attacks 08/31/2018   Depression 02/21/2015   Dandruff 06/22/2013   Obesity affecting pregnancy 07/04/2012    Past Surgical History:  Procedure Laterality Date   abscess tooth     CESAREAN SECTION N/A 10/01/2014   Procedure: PRIMARY CESAREAN SECTION;  Surgeon: Adam Phenix, MD;  Location: [facility];  Service: Obstetrics;  Laterality: N/A;   DIAGNOSTIC LAPAROSCOPY WITH REMOVAL OF ECTOPIC PREGNANCY N/A 06/27/2019   Procedure: DIAGNOSTIC LAPAROSCOPY , evacuation or hemoperitoneum;  Surgeon: Natale Milch, MD;  Location: [facility] ORS;  Service: Gynecology;  Laterality: N/A;   LAPAROSCOPIC UNILATERAL SALPINGECTOMY Right 06/27/2019   Procedure: LAPAROSCOPIC UNILATERAL SALPINGECTOMY;  Surgeon: Natale Milch, MD;  Location: [facility] ORS;  Service: Gynecology;  Laterality: Right;   TONSILLECTOMY     WISDOM TOOTH EXTRACTION      Family History  Problem Relation Age of Onset   Ovarian cysts Mother    Stroke Maternal Grandmother    Cancer Maternal Grandmother 27       uterine cancer   Lung cancer Paternal Grandfather    Diabetes Maternal Great-grandmother    Lung cancer Maternal Great-grandmother     Social History   Tobacco Use   Smoking status: Never   Smokeless tobacco: Never  Vaping Use   Vaping Use: Never used  Substance Use Topics   Alcohol use: Not Currently    Alcohol/week: 0.0 standard drinks    Comment: rare but none with pregnancy   Drug use: No     Allergies  Allergen Reactions   Sulfa Drugs Cross Reactors Anaphylaxis, Itching and Other (See Comments)    Flu like symptoms    Health Maintenance  Topic Date Due   Hepatitis C Screening  Never done   PAP SMEAR-Modifier  08/02/2023   TETANUS/TDAP  04/04/2030   HIV Screening  Completed    Pneumococcal Vaccine 63-61 Years old  Aged Out   HPV VACCINES  Aged Out   INFLUENZA VACCINE  Discontinued   COVID-19 Vaccine  Discontinued    Chart Review Today: I personally reviewed active problem list, medication list, allergies, family history, social history, health maintenance, notes from last encounter, lab results, imaging with the patient/caregiver today.   Review of Systems  Constitutional: Negative.   HENT: Negative.    Eyes: Negative.   Respiratory: Negative.    Cardiovascular: Negative.   Gastrointestinal: Negative.   Endocrine: Negative.   Genitourinary: Negative.   Musculoskeletal: Negative.   Skin: Negative.   Allergic/Immunologic: Negative.   Neurological: Negative.   Hematological: Negative.   Psychiatric/Behavioral: Negative.    All other systems reviewed and are negative.   Objective:  Vitals:   08/11/21 1013  BP: 108/60  Pulse: 96  Resp: 16  Temp: 98 F (36.7 C)  TempSrc: Oral  SpO2: 97%  Weight: 262 lb 1.6 oz (118.9 kg)  Height: 5\' 10"  (1.778 m)    Body mass index is 37.61 kg/m.  Physical Exam Vitals and nursing note reviewed.  Constitutional:      General: She is not in acute distress.    Appearance: Normal appearance. She is well-developed and well-groomed. She is obese. She is not ill-appearing, toxic-appearing or diaphoretic.     Interventions: Face mask in place.  HENT:     Head: Normocephalic and atraumatic.  Eyes:     General:        Right eye: No discharge.        Left eye: No discharge.     Conjunctiva/sclera: Conjunctivae normal.  Neck:     Trachea: No tracheal deviation.  Cardiovascular:     Rate and Rhythm: Normal rate and regular rhythm.     Pulses: Normal pulses.     Heart sounds: Normal heart sounds. No murmur heard.   No friction rub. No gallop.  Pulmonary:     Effort: Pulmonary effort is normal. No respiratory distress.     Breath sounds: Normal breath sounds. No stridor.  Musculoskeletal:        General:  Normal range of motion.  Skin:    General: Skin is warm and dry.     Findings: No rash.  Neurological:     Mental Status: She is alert.     Cranial Nerves: No dysarthria or facial asymmetry.     Motor: No tremor or seizure activity.     Gait: Gait is intact.  Psychiatric:        Attention and Perception: Attention and perception normal.        Mood and Affect: Mood and affect normal.        Speech: Speech normal.        Behavior: Behavior normal. Behavior is cooperative.        Thought Content: Thought content normal.        Assessment & Plan:     ICD-10-CM   1. Generalized anxiety disorder with panic attacks  F41.1    F41.0    Mood has improved with Lexapro 20 mg, no side effects or concerns, continue daily medications She was having significant anxiety through COVID and bothersome and frequent panic attacks reported last office visit when Lexapro was started she was also given a low-dose Ativan, number 15 pills, she is taking them occasionally and is still having panic attacks but overall is doing much better.  She does have 1 pill left and it makes her slightly anxious to know that she will be out soon. She has been unable to get established with a therapist. She typically enjoys the holidays it is her favorite time a year.  She would like to continue both medications at this time Controlled substance database reviewed, reviewed risk of benzodiazepines, agree to a refill of #20 to last for the next 6 months encouraged very strongly for her to get established with a licensed mental health professional if she has worsening anxiety symptoms, more frequent or more severe panic attacks requires more frequent use of Ativan explained that they will be able to do cognitive behavioral therapies and other treatments which are typically required to help minimize panic attacks/manage panic disorder She verbalized understanding of plan and of medication risks and side  effects    2. Encounter  for initial prescription of contraceptive pills  Z30.011 norethindrone (MICRONOR) 0.35 MG tablet   Menstrual cycles and heaviness are improved with continuous norethindrone last prescribed by OB/GYN refill today No missed doses, blood pressure at goal today, cannot take OCPs with estrogen due to migraine history, will be out of medications and she has not seen OB/GYN -willing to take over OCP Rx, Pap up-to-date, recent thorough STI testing with her pregnancy and young child, no new partners no pelvic symptoms    3. Medication monitoring encounter  Z51.81        Return for 6-9 month CPE and f/up on anxiety/meds and birth control.   Danelle Berry, PA-C 08/11/21 10:39 AM

## 2021-09-20 NOTE — L&D Delivery Note (Signed)
Delivery Summary for [facility]  Labor Events:   Preterm labor: No data found  Rupture date: No data found  Rupture time: No data found  Rupture type: No data found  Fluid Color: No data found  Induction: No data found  Augmentation: No data found  Complications: No data found  Cervical ripening: No data found No data found   No data found     Delivery:   Episiotomy: No data found  Lacerations: No data found  Repair suture: No data found  Repair # of packets: No data found  Blood loss (ml): No data found   Information for the patient's newborn:  Carrah, Eppolito [[id number]]   Delivery 05/04/2022 1:01 PM by  C-Section, Low Transverse Sex:  female Gestational Age: [redacted]w[redacted]d Delivery Clinician:   Living?:         APGARS  One minute Five minutes Ten minutes  Skin color:        Heart rate:        Grimace:        Muscle tone:        Breathing:        Totals: 8  9      Presentation/position:      Resuscitation:   Cord information:    Disposition of cord blood:     Blood gases sent?  Complications:   Placenta: Delivered:       appearance Newborn Measurements: Weight: 7 lb 6.2 oz (3350 g)  Height: 20"  Head circumference:    Chest circumference:    Other providers:    Additional  information: Forceps:   Vacuum:   Breech:   Observed anomalies       See Dr. Oretha Milch operative note for details of C-section procedure.    Hildred Laser, MD [facility]

## 2021-09-28 ENCOUNTER — Encounter: Payer: Self-pay | Admitting: Obstetrics

## 2021-09-28 ENCOUNTER — Other Ambulatory Visit: Payer: Self-pay

## 2021-09-28 ENCOUNTER — Other Ambulatory Visit (HOSPITAL_COMMUNITY)
Admission: RE | Admit: 2021-09-28 | Discharge: 2021-09-28 | Disposition: A | Discharge status: Home / Self Care | Payer: BC Managed Care – PPO | Source: Ambulatory Visit | Attending: Obstetrics | Admitting: Obstetrics

## 2021-09-28 ENCOUNTER — Ambulatory Visit (INDEPENDENT_AMBULATORY_CARE_PROVIDER_SITE_OTHER): Payer: BC Managed Care – PPO | Admitting: Obstetrics

## 2021-09-28 VITALS — BP 126/84 | Wt 262.0 lb

## 2021-09-28 DIAGNOSIS — O0990 Supervision of high risk pregnancy, unspecified, unspecified trimester: Secondary | ICD-10-CM | POA: Diagnosis not present

## 2021-09-28 DIAGNOSIS — Z3A08 8 weeks gestation of pregnancy: Secondary | ICD-10-CM

## 2021-09-28 DIAGNOSIS — Z124 Encounter for screening for malignant neoplasm of cervix: Secondary | ICD-10-CM

## 2021-09-28 DIAGNOSIS — N912 Amenorrhea, unspecified: Secondary | ICD-10-CM | POA: Diagnosis not present

## 2021-09-28 DIAGNOSIS — Z113 Encounter for screening for infections with a predominantly sexual mode of transmission: Secondary | ICD-10-CM

## 2021-09-28 LAB — POCT URINE PREGNANCY: Preg Test, Ur: POSITIVE — AB

## 2021-09-28 NOTE — Progress Notes (Signed)
New Obstetric Patient H&P    Chief Complaint: "Desires prenatal care"   History of Present Illness: Patient is a 31 y.o. [id number] Not Hispanic or Latino female, LMP 07/28/2021 presents with amenorrhea and positive home pregnancy test. Based on her  LMP, her EDD is Estimated Date of Delivery: 05/04/22 and her EGA is [redacted]w[redacted]d Cycles are 6. days, regular, and occur approximately every : 28 days. Her last pap smear was 1 years ago and was no abnormalities.    She had a urine pregnancy test which was positive about 3 week(s)  ago. Her last menstrual period was normal and lasted for  about 6 day(s). Since her LMP she claims she has experienced fatigue. She denies vaginal bleeding. Her past medical history is noncontributory. Her prior pregnancies are notable for a VBAC with cervical laceration that was repeaired in the birthing room by Dr. SGilman Schmidt Since her LMP, she admits to the use of tobacco products  no She claims she has gained   no pounds since the start of her pregnancy.  There are cats in the home in the home  yes If yes Indoor She admits close contact with children on a regular basis  yes  She has had chicken pox in the past yes She has had Tuberculosis exposures, symptoms, or previously tested positive for TB   no Current or past history of domestic violence. no  Genetic Screening/Teratology Counseling: (Includes patient, baby's father, or anyone in either family with:)   [facility] 2. Thalassemia ([place] or [place] background): MCV<80  no 3. Neural tube defect (meningomyelocele, spina bifida, anencephaly)  no 4. Congenital heart defect  no  5. Down syndrome  no 6. Tay-Sachs (Jewish, [place]  no 7. Canavan's Disease  no 8. Sickle cell disease or trait (African)  no  9. Hemophilia or other blood disorders  no  10. Muscular dystrophy  no  11. Cystic fibrosis  no  12. Huntington's Chorea  no  13. Mental retardation/autism   no 14. Other inherited genetic or chromosomal disorder  no 15. Maternal metabolic disorder (DM, PKU, etc)  no 16. Patient or FOB with a child with a birth defect not listed above no  16a. Patient or FOB with a birth defect themselves no 17. Recurrent pregnancy loss, or stillbirth  no  18. Any medications since LMP other than prenatal vitamins (include vitamins, supplements, OTC meds, drugs, alcohol)  no 19. Any other genetic/environmental exposure to discuss  no  Infection History:   1. Lives with someone with TB or TB exposed  no  2. Patient or partner has history of genital herpes  no 3. Rash or viral illness since LMP  no 4. History of STI (GC, CT, HPV, syphilis, HIV)  no 5. History of recent travel :  no  Other pertinent information:  no     Review of Systems:10 point review of systems negative unless otherwise noted in HPI  Past Medical History:  Past Medical History:  Diagnosis Date   [redacted] weeks gestation of pregnancy    Anxiety    Bipolar disorder ([place]    no med currently   Depression    Ectopic pregnancy    Hypoglycemic syndrome    Illicit drug use 144/92/0100  Migraines    otc med prn. with aura    Poor fetal growth affecting management of mother in third trimester    Single liveborn  infant delivered vaginally 06/09/2020   VBAC, delivered, current hospitalization 06/09/2020    Past Surgical History:  Past Surgical History:  Procedure Laterality Date   abscess tooth     CESAREAN SECTION N/A 10/01/2014   Procedure: PRIMARY CESAREAN SECTION;  Surgeon: Woodroe Mode, MD;  Location: [facility];  Service: Obstetrics;  Laterality: N/A;   DIAGNOSTIC LAPAROSCOPY WITH REMOVAL OF ECTOPIC PREGNANCY N/A 06/27/2019   Procedure: DIAGNOSTIC LAPAROSCOPY , evacuation or hemoperitoneum;  Surgeon: Homero Fellers, MD;  Location: [facility];  Service: Gynecology;  Laterality: N/A;   LAPAROSCOPIC UNILATERAL SALPINGECTOMY Right 06/27/2019   Procedure: LAPAROSCOPIC  UNILATERAL SALPINGECTOMY;  Surgeon: Homero Fellers, MD;  Location: [facility];  Service: Gynecology;  Laterality: Right;   TONSILLECTOMY     WISDOM TOOTH EXTRACTION      Gynecologic History: Patient's last menstrual period was 07/28/2021 (exact date).  Obstetric History: [id number]  Family History:  Family History  Problem Relation Age of Onset   Ovarian cysts Mother    Stroke Maternal Grandmother    Cancer Maternal Grandmother 27       uterine cancer   Lung cancer Paternal Grandfather    Diabetes Maternal Great-grandmother    Lung cancer Maternal Great-grandmother     Social History:  Social History   Socioeconomic History   Marital status: Married    Spouse name: Remo Lipps   Number of children: 3   Years of education: 17   Highest education level: Bachelor's degree (e.g., BA, AB, BS)  Occupational History   Not on file  Tobacco Use   Smoking status: Never   Smokeless tobacco: Never  Vaping Use   Vaping Use: Never used  Substance and Sexual Activity   Alcohol use: Not Currently    Alcohol/week: 0.0 standard drinks    Comment: rare but none with pregnancy   Drug use: No   Sexual activity: Yes    Partners: Male    Birth control/protection: None  Other Topics Concern   Not on file  Social History Narrative   G0- engaged.   Works at [employer] but also attends [employer] for [employer].   Has two step children- ages 65 and 22 that permanently stays with her. Patient also has a biological 54 yr old son.   Social Determinants of Health   Financial Resource Strain: Not on file  Food Insecurity: Not on file  Transportation Needs: Not on file  Physical Activity: Not on file  Stress: Not on file  Social Connections: Not on file  Intimate Partner Violence: Not on file    Allergies:  Allergies  Allergen Reactions   Sulfa Drugs Cross Reactors Anaphylaxis, Itching and Other (See Comments)    Flu like symptoms    Medications: Prior to Admission  medications   Medication Sig Start Date End Date Taking? Authorizing Provider  escitalopram (LEXAPRO) 20 MG tablet Take 1 tablet (20 mg total) by mouth daily. After 2 weeks, if tolerating, increase to 68m daily. 05/12/21  Yes RMyles Gip DO  LORazepam (ATIVAN) 0.5 MG tablet Take 1 tablet (0.5 mg total) by mouth daily as needed for anxiety. Patient not taking: Reported on 09/28/2021 08/11/21   TDelsa Grana PA-C    Physical Exam Vitals: Blood pressure 126/84, weight 262 lb (118.8 kg), last menstrual period 07/28/2021, currently breastfeeding.  General: NAD HEENT: normocephalic, anicteric Thyroid: no enlargement, no palpable nodules Pulmonary: No increased work of breathing, CTAB Cardiovascular: RRR, distal pulses 2+ Abdomen: NABS, soft, non-tender, non-distended.  Umbilicus without lesions.  No hepatomegaly, splenomegaly or masses palpable. No evidence of hernia  Genitourinary:  External: Normal external female genitalia.  Normal urethral meatus, normal  Bartholin's and Skene's glands.    Vagina: Normal vaginal mucosa, no evidence of prolapse.    Cervix: Grossly normal in appearance, no bleeding  Uterus: anteverted, Non-enlarged, mobile, normal contour.  No CMT  Adnexa: ovaries non-enlarged, no adnexal masses  Rectal: deferred Extremities: no edema, erythema, or tenderness Neurologic: Grossly intact Psychiatric: mood appropriate, affect full   Assessment: 31 y.o. [id number] at 58w6dpresenting to initiate prenatal care  Plan: 1) Avoid alcoholic beverages. 2) Patient encouraged not to smoke.  3) Discontinue the use of all non-medicinal drugs and chemicals.  4) Take prenatal vitamins daily.  5) Nutrition, food safety (fish, cheese advisories, and high nitrite foods) and exercise discussed. 6) Hospital and practice style discussed with cross coverage system.  7) Genetic Screening, such as with 1st Trimester Screening, cell free fetal DNA, AFP testing, and Ultrasound, as well as with  amniocentesis and CVS as appropriate, is discussed with patient. At the conclusion of today's visit patient requested genetic testing 8) Patient is asked about travel to areas at risk for the Zika virus, and counseled to avoid travel and exposure to mosquitoes or sexual partners who may have themselves been exposed to the virus. Testing is discussed, and will be ordered as appropriate.  The following were addressed during this visit:  Breastfeeding Education - Early initiation of breastfeeding    Comments: Keeps milk supply adequate, helps contract uterus and slow bleeding, and early milk is the perfect first food and is easy to digest.   - The importance of exclusive breastfeeding    Comments: Provides antibodies, Lower risk of breast and ovarian cancers, and type-2 diabetes,Helps your body recover, Reduced chance of SIDS.   - Risks of giving your baby anything other than breast milk if you are breastfeeding    Comments: Make the baby less content with breastfeeds, may make my baby more susceptible to illness, and may reduce my milk supply.   - Nonpharmacological pain relief methods for labor    Comments: Deep breathing, focusing on pleasant things, movement and walking, heating pads or cold compress, massage and relaxation, continuous support from someone you trust, and Doulas   - The importance of early skin-to-skin contact    Comments: Keeps baby warm and secure, helps keep babys blood sugar up and breathing steady, easier to bond and breastfeed, and helps calm baby.  - Rooming-in on a 24-hour basis    Comments: Easier to learn baby's feeding cues, easier to bond and get to know each other, and encourages milk production.   - Feeding on demand or baby-led feeding    Comments: Helps prevent breastfeeding complications, helps bring in good milk supply, prevents under or overfeeding, and helps baby feel content and satisfied   - Frequent feeding to help assure optimal milk  production    Comments: Making a full supply of milk requires frequent removal of milk from breasts, infant will eat 8-12 times in 24 hours, if separated from infant use breast massage, hand expression and/ or pumping to remove milk from breasts.   - Effective positioning and attachment    Comments: Helps my baby to get enough breast milk, helps to produce an adequate milk supply, and helps prevent nipple pain and damage   - Exclusive breastfeeding for the first 6 months    Comments: Builds a healthy milk supply and keeps it up,  protects baby from sickness and disease, and breastmilk has everything your baby needs for the first 6 months.  - Individualized Education    Comments: Contraindications to breastfeeding and other special medical conditions   She lis likely to prefer another VBAC. Her husband plans vasectomy; she may consider adding an IUD for cycle control.  Per her history, she is a candidate for Myriad testing,a dn we discussed this today. A pamphlet was provided for her, and she will consult with her insurance coverage about any cost. RTC in 4 weeks for blood work, including MaternT test,ROB and a dating scan.  Imagene Riches, CNM  09/28/2021 11:09 AM

## 2021-09-28 NOTE — Progress Notes (Signed)
NOB today. LMP 07/28/2021

## 2021-09-30 LAB — CERVICOVAGINAL ANCILLARY ONLY
Bacterial Vaginitis (gardnerella): NEGATIVE
Candida Glabrata: NEGATIVE
Candida Vaginitis: NEGATIVE
Chlamydia: NEGATIVE
Comment: NEGATIVE
Comment: NEGATIVE
Comment: NEGATIVE
Comment: NEGATIVE
Comment: NORMAL
Neisseria Gonorrhea: NEGATIVE

## 2021-09-30 LAB — URINE CULTURE

## 2021-10-14 ENCOUNTER — Other Ambulatory Visit: Payer: Self-pay

## 2021-10-14 ENCOUNTER — Other Ambulatory Visit: Payer: Self-pay | Admitting: Obstetrics

## 2021-10-14 ENCOUNTER — Ambulatory Visit
Admission: RE | Admit: 2021-10-14 | Discharge: 2021-10-14 | Disposition: A | Discharge status: Home / Self Care | Payer: BC Managed Care – PPO | Source: Ambulatory Visit | Attending: Obstetrics | Admitting: Obstetrics

## 2021-10-14 DIAGNOSIS — Z3A10 10 weeks gestation of pregnancy: Secondary | ICD-10-CM | POA: Diagnosis not present

## 2021-10-14 DIAGNOSIS — O26841 Uterine size-date discrepancy, first trimester: Secondary | ICD-10-CM | POA: Diagnosis not present

## 2021-10-14 DIAGNOSIS — O0990 Supervision of high risk pregnancy, unspecified, unspecified trimester: Secondary | ICD-10-CM

## 2021-10-27 ENCOUNTER — Other Ambulatory Visit: Payer: Self-pay

## 2021-10-27 ENCOUNTER — Ambulatory Visit (INDEPENDENT_AMBULATORY_CARE_PROVIDER_SITE_OTHER): Payer: BC Managed Care – PPO | Admitting: Obstetrics

## 2021-10-27 VITALS — BP 122/72 | Wt 263.0 lb

## 2021-10-27 DIAGNOSIS — Z1379 Encounter for other screening for genetic and chromosomal anomalies: Secondary | ICD-10-CM

## 2021-10-27 DIAGNOSIS — O99210 Obesity complicating pregnancy, unspecified trimester: Secondary | ICD-10-CM

## 2021-10-27 DIAGNOSIS — Z3A12 12 weeks gestation of pregnancy: Secondary | ICD-10-CM | POA: Diagnosis not present

## 2021-10-27 DIAGNOSIS — O0990 Supervision of high risk pregnancy, unspecified, unspecified trimester: Secondary | ICD-10-CM | POA: Diagnosis not present

## 2021-10-27 NOTE — Progress Notes (Signed)
Routine Prenatal Care Visit  Subjective  Emily Payne is a 31 y.o. [phone] at [redacted]w[redacted]d being seen today for ongoing prenatal care.  She is currently monitored for the following issues for this high-risk pregnancy and has Obesity affecting pregnancy; Dandruff; Depression; Generalized anxiety disorder with panic attacks; S/P laparoscopic surgery; BMI 37.0-37.9, adult; and Supervision of high risk pregnancy, antepartum on their problem list.  ----------------------------------------------------------------------------------- Patient reports no complaints.  She is working hard to gain little weight this pregnancy. Has a veggie craving :)  Desires Maternt test. Contractions: Not present. Vag. Bleeding: None.   . Leaking Fluid denies.  ----------------------------------------------------------------------------------- The following portions of the patient's history were reviewed and updated as appropriate: allergies, current medications, past family history, past medical history, past social history, past surgical history and problem list. Problem list updated.  Objective  Blood pressure 122/72, weight 263 lb (119.3 kg), last menstrual period 07/28/2021, currently breastfeeding. Pregravid weight 262 lb (118.8 kg) Total Weight Gain 1 lb (0.454 kg) Urinalysis: Urine Protein    Urine Glucose    Fetal Status:           General:  Alert, oriented and cooperative. Patient is in no acute distress.  Skin: Skin is warm and dry. No rash noted.   Cardiovascular: Normal heart rate noted  Respiratory: Normal respiratory effort, no problems with respiration noted  Abdomen: Soft, gravid, appropriate for gestational age. Pain/Pressure: Absent     Pelvic:  Cervical exam deferred        Extremities: Normal range of motion.     Mental Status: Normal mood and affect. Normal behavior. Normal judgment and thought content.   Assessment   31 y.o. [id number] at [redacted]w[redacted]d by  05/11/2022, by Ultrasound presenting for routine  prenatal visit  Plan   pregnancy Problems (from 09/28/21 to present)    Problem Noted Resolved   Supervision of high risk pregnancy, antepartum 09/28/2021 by Imagene Riches, CNM No   Overview Addendum 10/27/2021  9:30 AM by Imagene Riches, CNM     Nursing Staff Provider  Office Location  [facility] on 1/25 [redacted]w[redacted]d, so EDD is 05/11/2022  Language  English Anatomy US    Flu Vaccine   Genetic Screen  NIPS:   TDaP vaccine    Hgb A1C or  GTT Early : Third trimester :   Covid    LAB RESULTS   Rhogam   Blood Type     Feeding Plan Breast Antibody    Contraception IUD?, vasectomy Rubella    Circumcision  RPR     Pediatrician   HBsAg     Support Person steven HIV    Prenatal Classes  Varicella     GBS  (For PCN allergy, check sensitivities)   BTL Consent     VBAC Consent  Pap      Hgb Electro      CF      SMA                   Preterm labor symptoms and general obstetric precautions including but not limited to vaginal bleeding, contractions, leaking of fluid and fetal movement were reviewed in detail with the patient. Please refer to After Visit Summary for other counseling recommendations.  She desires cell free DNA. Maternt T testing- this is added to her prenatal labs. Unable to get FHT today. Reassuring dating scan noted, so will have her return in 2 weeks for FHTS check.  Return in about 2 weeks (  around 11/10/2021) for return OB for [place] check.  Imagene Riches, CNM  10/27/2021 9:50 AM

## 2021-10-28 LAB — HCV INTERPRETATION

## 2021-10-28 LAB — CBC/D/PLT+RPR+RH+ABO+RUBIGG...
Antibody Screen: NEGATIVE
Basophils Absolute: 0 10*3/uL (ref 0.0–0.2)
Basos: 0 %
EOS (ABSOLUTE): 0.1 10*3/uL (ref 0.0–0.4)
Eos: 1 %
HCV Ab: <0.1 s/co ratio (ref 0.0–0.9)
HIV Screen 4th Generation wRfx: NONREACTIVE
Hematocrit: 43 % (ref 34.0–46.6)
Hemoglobin: 14.8 g/dL (ref 11.1–15.9)
Hepatitis B Surface Ag: NEGATIVE
Immature Grans (Abs): 0 10*3/uL (ref 0.0–0.1)
Immature Granulocytes: 0 %
Lymphocytes Absolute: 3 10*3/uL (ref 0.7–3.1)
Lymphs: 35 %
MCH: 30.6 pg (ref 26.6–33.0)
MCHC: 34.4 g/dL (ref 31.5–35.7)
MCV: 89 fL (ref 79–97)
Monocytes Absolute: 0.4 10*3/uL (ref 0.1–0.9)
Monocytes: 4 %
Neutrophils Absolute: 5.2 10*3/uL (ref 1.4–7.0)
Neutrophils: 60 %
Platelets: 212 10*3/uL (ref 150–450)
RBC: 4.84 x10E6/uL (ref 3.77–5.28)
RDW: 13.1 % (ref 11.7–15.4)
RPR Ser Ql: NONREACTIVE
Rh Factor: NEGATIVE
Rubella Antibodies, IGG: 5.6 index (ref >0.99)
Varicella zoster IgG: 3434 index (ref >165)
WBC: 8.7 10*3/uL (ref 3.4–10.8)

## 2021-10-28 LAB — HGB A1C W/O EAG: Hgb A1c MFr Bld: 4.9 % (ref 4.8–5.6)

## 2021-11-02 LAB — MATERNIT 21 PLUS CORE, BLOOD
Fetal Fraction: 9
Result (T21): NEGATIVE
Trisomy 13 (Patau syndrome): NEGATIVE
Trisomy 18 (Edwards syndrome): NEGATIVE
Trisomy 21 (Down syndrome): NEGATIVE

## 2021-11-13 ENCOUNTER — Ambulatory Visit (INDEPENDENT_AMBULATORY_CARE_PROVIDER_SITE_OTHER): Payer: BC Managed Care – PPO | Admitting: Obstetrics

## 2021-11-13 ENCOUNTER — Encounter: Payer: Self-pay | Admitting: Obstetrics

## 2021-11-13 ENCOUNTER — Other Ambulatory Visit: Payer: Self-pay

## 2021-11-13 VITALS — BP 120/78 | Wt 269.0 lb

## 2021-11-13 DIAGNOSIS — O0990 Supervision of high risk pregnancy, unspecified, unspecified trimester: Secondary | ICD-10-CM

## 2021-11-13 NOTE — Progress Notes (Signed)
Routine Prenatal Care Visit  Subjective  Emily Payne is a 31 y.o. [phone] at [redacted]w[redacted]d being seen today for ongoing prenatal care.  She is currently monitored for the following issues for this low-risk pregnancy and has Obesity affecting pregnancy; Dandruff; Depression; Generalized anxiety disorder with panic attacks; S/P laparoscopic surgery; BMI 37.0-37.9, adult; and Supervision of high risk pregnancy, antepartum on their problem list.  ----------------------------------------------------------------------------------- Patient reports no complaints.  She has had some wiight loss due to lack of appetite. Contractions: Not present. Vag. Bleeding: None.   . Leaking Fluid denies.  ----------------------------------------------------------------------------------- The following portions of the patient's history were reviewed and updated as appropriate: allergies, current medications, past family history, past medical history, past social history, past surgical history and problem list. Problem list updated.  Objective  Blood pressure 120/78, weight 269 lb (122 kg), last menstrual period 07/28/2021, currently breastfeeding. Pregravid weight 262 lb (118.8 kg) Total Weight Gain 7 lb (3.175 kg) Urinalysis: Urine Protein    Urine Glucose    Fetal Status:           General:  Alert, oriented and cooperative. Patient is in no acute distress.  Skin: Skin is warm and dry. No rash noted.   Cardiovascular: Normal heart rate noted  Respiratory: Normal respiratory effort, no problems with respiration noted  Abdomen: Soft, gravid, appropriate for gestational age.       Pelvic:  Cervical exam deferred        Extremities: Normal range of motion.     Mental Status: Normal mood and affect. Normal behavior. Normal judgment and thought content.   Assessment   30 y.o. [id number] at [redacted]w[redacted]d by  05/11/2022, by Ultrasound presenting for routine prenatal visit  Plan   pregnancy Problems (from 09/28/21 to present)     Problem Noted Resolved   Supervision of high risk pregnancy, antepartum 09/28/2021 by [name], CNM No   Overview Addendum 11/06/2021  1:17 PM by [name], CNM     Nursing Staff Provider  Office Location  [facility] Dating  [facility] on 1/25 [redacted]w[redacted]d, so EDD is 05/11/2022  Language  English Anatomy US    Flu Vaccine   Genetic Screen  NIPS:   TDaP vaccine    Hgb A1C or  GTT Early : Third trimester :   Covid    LAB RESULTS   Rhogam   Blood Type     Feeding Plan Breast Antibody    Contraception IUD?, vasectomy Rubella    Circumcision  RPR     Pediatrician   HBsAg     Support Person steven HIV    Prenatal Classes  Varicella     GBS  (For PCN allergy, check sensitivities)   BTL Consent     VBAC Consent  Pap      Hgb Electro      CF      SMA                   Preterm labor symptoms and general obstetric precautions including but not limited to vaginal bleeding, contractions, leaking of fluid and fetal movement were reviewed in detail with the patient. Please refer to After Visit Summary for other counseling recommendations.  Her anatomy scan is ordered. To be held at [facility]  Return in about 4 weeks (around 12/11/2021) for return OB.  [name], CNM  11/13/2021 9:56 AM

## 2021-11-13 NOTE — Progress Notes (Signed)
No vb. No lof. Pt having some nausea, would like a RX.

## 2021-12-01 ENCOUNTER — Ambulatory Visit (HOSPITAL_BASED_OUTPATIENT_CLINIC_OR_DEPARTMENT_OTHER): Payer: BC Managed Care – PPO | Admitting: Obstetrics and Gynecology

## 2021-12-01 ENCOUNTER — Other Ambulatory Visit: Payer: BC Managed Care – PPO

## 2021-12-01 ENCOUNTER — Other Ambulatory Visit: Payer: Self-pay

## 2021-12-01 ENCOUNTER — Ambulatory Visit: Payer: BC Managed Care – PPO | Attending: Obstetrics and Gynecology

## 2021-12-01 DIAGNOSIS — O34211 Maternal care for low transverse scar from previous cesarean delivery: Secondary | ICD-10-CM | POA: Diagnosis not present

## 2021-12-01 DIAGNOSIS — Z98891 History of uterine scar from previous surgery: Secondary | ICD-10-CM | POA: Diagnosis not present

## 2021-12-01 DIAGNOSIS — Z363 Encounter for antenatal screening for malformations: Secondary | ICD-10-CM | POA: Insufficient documentation

## 2021-12-01 DIAGNOSIS — O99212 Obesity complicating pregnancy, second trimester: Secondary | ICD-10-CM | POA: Insufficient documentation

## 2021-12-01 DIAGNOSIS — O0990 Supervision of high risk pregnancy, unspecified, unspecified trimester: Secondary | ICD-10-CM

## 2021-12-01 DIAGNOSIS — E669 Obesity, unspecified: Secondary | ICD-10-CM | POA: Diagnosis not present

## 2021-12-01 DIAGNOSIS — O34219 Maternal care for unspecified type scar from previous cesarean delivery: Secondary | ICD-10-CM

## 2021-12-01 DIAGNOSIS — Z3A17 17 weeks gestation of pregnancy: Secondary | ICD-10-CM | POA: Diagnosis not present

## 2021-12-01 NOTE — Progress Notes (Signed)
Maternal-Fetal Medicine  ? ?Name: Emily Payne ?DOB: 1991-09-09 ?MRN: [id number] ?Referring Provider:  ? ?I had the pleasure of seeing [name] today at [facility], [facility].  She is G4 P2012 at 17-weeks' gestation and is here for fetal anatomy scan. ? ?Prenatal: On cell-free fetal DNA screening, the risks of fetal aneuploidies are not increased. ?Obstetric history ? ?-2016: Term cesarean delivery (performed because of breech presentation) of a female infant weighing 6 pounds and 13 ounces at birth. ?-2020: Ruptured ectopic pregnancy and patient had laparoscopic salpingectomy. ?-2021: Term vaginal delivery (VBAC) of a female infant weighing 8 pounds and 6 ounces at birth.   ?Her pregnancy was not complicated by gestational diabetes.  After delivery cervical laceration was seen and was repaired.  Patient did not require blood transfusions. ? ?Past medical history: No history of diabetes or hypertension or any chronic medical conditions. ?Past surgical history: Cesarean section, laparoscopy. ?Allergies: Sulfa drugs (flulike symptoms and rashes). ?Medications: Prenatal vitamins, Lexapro 20 mg daily ?Social history: Denies tobacco or drug or alcohol use.  Her partner is in good health. ?GYN history: No history of abnormal Pap smears or cervical surgeries.  No history of breast disease. ? ?Ultrasound ?We performed a fetal anatomical survey.  Amniotic fluid is normal and good fetal activity seen.  No markers of aneuploidies or fetal structural defects are seen.  Fetal anatomical survey is very limited because of early gestational age and maternal body habitus.  Fetal biometry is consistent with the previously established dates. ? ?Placenta is posterior and there is no evidence of previa or placenta accreta spectrum ?As maternal obesity imposes limitations on resolution of ultrasound images, fetal anomalies can be missed. ? ?Previous cesarean delivery ?Patient had successful VBAC after a cesarean delivery.  History of  successful VBAC increase in the chances of VBAC in this pregnancy.  Repeat cesarean deliveries increase the risk of placenta previa and placenta accreta spectrum.  I reassured the patient of normal placental location.  The risk of scar rupture is about 1%.  If patient requires induction of labor the risk is only slightly increased (up to 2%). ? ?History of cervical laceration ?On transabdominal scan, the cervix looks long and closed.  Patient had a successful immediate repair after delivery.  From the operative note, I see the tear was not extensive and is unlikely to cause cervical insufficiency.  We will reevaluate the cervix in 4 weeks.  I reassured the patient that she can attempt vaginal birth. ? ?Maternal obesity ?Her prepregnancy BMI is 37.4.  Patient can [facility] at [facility]. ? ?Thank you for consultation.  If you have any questions or concerns, please contact me the [facility].  Consultation including face-to-face counseling (more than 50% of time spent) is 30 minutes. ? ? ? ? ?

## 2021-12-08 ENCOUNTER — Other Ambulatory Visit: Payer: BC Managed Care – PPO

## 2021-12-11 ENCOUNTER — Ambulatory Visit (INDEPENDENT_AMBULATORY_CARE_PROVIDER_SITE_OTHER): Payer: BC Managed Care – PPO | Admitting: Licensed Practical Nurse

## 2021-12-11 ENCOUNTER — Encounter: Payer: Self-pay | Admitting: Licensed Practical Nurse

## 2021-12-11 ENCOUNTER — Other Ambulatory Visit: Payer: Self-pay

## 2021-12-11 VITALS — BP 120/80 | Wt 266.0 lb

## 2021-12-11 DIAGNOSIS — O0990 Supervision of high risk pregnancy, unspecified, unspecified trimester: Secondary | ICD-10-CM

## 2021-12-11 DIAGNOSIS — Z3A18 18 weeks gestation of pregnancy: Secondary | ICD-10-CM

## 2021-12-11 NOTE — Progress Notes (Signed)
Routine Prenatal Care Visit ? ?Subjective  ?Emily Payne is a 31 y.o. [phone] at [redacted]w[redacted]d being seen today for ongoing prenatal care.  She is currently monitored for the following issues for this high-risk pregnancy and has Obesity affecting pregnancy; Dandruff; Depression; Generalized anxiety disorder with panic attacks; S/P laparoscopic surgery; BMI 37.0-37.9, adult; and Supervision of high risk pregnancy, antepartum on their problem list.  ?----------------------------------------------------------------------------------- ?Patient reports fatigue and nausea.  HO given for Unisom and B6. Otherwise doing well.  Mood is good.  Has fu [place] to assess cervix.  Desires VBAC.  ?Contractions: Not present. Vag. Bleeding: None.  Movement: Present. Leaking Fluid denies.  ?----------------------------------------------------------------------------------- ?The following portions of the patient's history were reviewed and updated as appropriate: allergies, current medications, past family history, past medical history, past social history, past surgical history and problem list. Problem list updated. ? ?Objective  ?Blood pressure 120/80, weight 266 lb (120.7 kg), last menstrual period 07/28/2021, currently breastfeeding. ?Pregravid weight 262 lb (118.8 kg) Total Weight Gain 4 lb (1.814 kg) ?Urinalysis: Urine Protein    Urine Glucose   ? ?Fetal Status: Fetal Heart Rate (bpm): 150   Movement: Present    ? ?General:  Alert, oriented and cooperative. Patient is in no acute distress.  ?Skin: Skin is warm and dry. No rash noted.   ?Cardiovascular: Normal heart rate noted  ?Respiratory: Normal respiratory effort, no problems with respiration noted  ?Abdomen: Soft, gravid, appropriate for gestational age. Pain/Pressure: Present     ?Pelvic:  Cervical exam deferred        ?Extremities: Normal range of motion.  Edema: None  ?Mental Status: Normal mood and affect. Normal behavior. Normal judgment and thought content.  ? ?Assessment  ? ?31  y.o. [id number] at [redacted]w[redacted]d by  05/11/2022, by Ultrasound presenting for routine prenatal visit ? ?Plan  ? ?pregnancy Problems (from 09/28/21 to present)   ? ? Problem Noted Resolved  ? Supervision of high risk pregnancy, antepartum 09/28/2021 by Imagene Riches, CNM No  ? Overview Addendum 12/10/2021  7:46 PM by Imagene Riches, CNM  ?   ?Nursing Staff Provider  ?Office Location  [facility]  sono on 1/25 [redacted]w[redacted]d, so EDD is 05/11/2022  ?Language  English Anatomy US  Normal female  ?Flu Vaccine   Genetic Screen  NIPS:   ?TDaP vaccine    Hgb A1C or  ?GTT Early : ?Third trimester :   ?Covid    LAB RESULTS   ?Rhogam   Blood Type   A negative  ?Feeding Plan Breast Antibody  negative  ?Contraception IUD?, vasectomy Rubella  immune  ?Circumcision  RPR   NR  ?Pediatrician   HBsAg   negative  ?Support Person steven HIV  negative  ?Prenatal Classes  Varicella immune  ?  GBS  (For PCN allergy, check sensitivities)   ?BTL Consent     ?VBAC Consent  Pap  08/01/2020- NILM  ?  Hgb Electro    ?  CF   ?   SMA   ?     ? ? ?  ?  ? ?  ?  ? ?Preterm labor symptoms and general obstetric precautions including but not limited to vaginal bleeding, contractions, leaking of fluid and fetal movement were reviewed in detail with the patient. ?Please refer to After Visit Summary for other counseling recommendations.  ? ?Return in about 4 weeks (around 01/08/2022) for [place]. ? ?@MYSIGNATURE @  ? ?

## 2021-12-15 ENCOUNTER — Other Ambulatory Visit: Payer: BC Managed Care – PPO

## 2021-12-24 ENCOUNTER — Other Ambulatory Visit: Payer: Self-pay

## 2021-12-24 DIAGNOSIS — O34219 Maternal care for unspecified type scar from previous cesarean delivery: Secondary | ICD-10-CM

## 2021-12-24 DIAGNOSIS — O99212 Obesity complicating pregnancy, second trimester: Secondary | ICD-10-CM

## 2021-12-29 ENCOUNTER — Ambulatory Visit: Payer: BC Managed Care – PPO | Attending: Obstetrics and Gynecology

## 2021-12-29 ENCOUNTER — Other Ambulatory Visit: Payer: Self-pay

## 2021-12-29 VITALS — BP 99/70 | HR 97 | Temp 98.0°F | Ht 70.0 in | Wt 265.0 lb

## 2021-12-29 DIAGNOSIS — O34219 Maternal care for unspecified type scar from previous cesarean delivery: Secondary | ICD-10-CM | POA: Insufficient documentation

## 2021-12-29 DIAGNOSIS — O99212 Obesity complicating pregnancy, second trimester: Secondary | ICD-10-CM | POA: Diagnosis not present

## 2021-12-29 DIAGNOSIS — Z8759 Personal history of other complications of pregnancy, childbirth and the puerperium: Secondary | ICD-10-CM | POA: Diagnosis not present

## 2021-12-29 DIAGNOSIS — E669 Obesity, unspecified: Secondary | ICD-10-CM

## 2021-12-29 DIAGNOSIS — Z363 Encounter for antenatal screening for malformations: Secondary | ICD-10-CM | POA: Diagnosis not present

## 2021-12-29 DIAGNOSIS — N881 Old laceration of cervix uteri: Secondary | ICD-10-CM | POA: Insufficient documentation

## 2021-12-29 DIAGNOSIS — O0990 Supervision of high risk pregnancy, unspecified, unspecified trimester: Secondary | ICD-10-CM

## 2021-12-29 DIAGNOSIS — Z3A21 21 weeks gestation of pregnancy: Secondary | ICD-10-CM | POA: Diagnosis not present

## 2022-01-04 ENCOUNTER — Encounter: Payer: Self-pay | Admitting: Obstetrics and Gynecology

## 2022-01-04 ENCOUNTER — Ambulatory Visit (INDEPENDENT_AMBULATORY_CARE_PROVIDER_SITE_OTHER): Payer: BC Managed Care – PPO | Admitting: Obstetrics and Gynecology

## 2022-01-04 VITALS — BP 120/70 | Wt 266.0 lb

## 2022-01-04 DIAGNOSIS — O0990 Supervision of high risk pregnancy, unspecified, unspecified trimester: Secondary | ICD-10-CM

## 2022-01-04 NOTE — Progress Notes (Signed)
? ? ?  Routine Prenatal Care Visit ? ?Subjective  ?Emily Payne is a 31 y.o. [phone] at [redacted]w[redacted]d being seen today for ongoing prenatal care.  She is currently monitored for the following issues for this high-risk pregnancy and has Obesity affecting pregnancy; Dandruff; Depression; Generalized anxiety disorder with panic attacks; S/P laparoscopic surgery; BMI 37.0-37.9, adult; and Supervision of high risk pregnancy, antepartum on their problem list.  ?----------------------------------------------------------------------------------- ?Patient reports no complaints.   ?Contractions: Not present. Vag. Bleeding: None.  Movement: Present. Denies leaking of fluid.  ?----------------------------------------------------------------------------------- ?The following portions of the patient's history were reviewed and updated as appropriate: allergies, current medications, past family history, past medical history, past social history, past surgical history and problem list. Problem list updated. ? ? ?Objective  ?Blood pressure 120/70, weight 266 lb (120.7 kg), last menstrual period 07/28/2021, currently breastfeeding. ?Pregravid weight 262 lb (118.8 kg) Total Weight Gain 4 lb (1.814 kg) ?Urinalysis:     ? ?Fetal Status: Fetal Heart Rate (bpm): 145 Fundal Height: 23 cm Movement: Present    ? ?General:  Alert, oriented and cooperative. Patient is in no acute distress.  ?Skin: Skin is warm and dry. No rash noted.   ?Cardiovascular: Normal heart rate noted  ?Respiratory: Normal respiratory effort, no problems with respiration noted  ?Abdomen: Soft, gravid, appropriate for gestational age. Pain/Pressure: Absent     ?Pelvic:  Cervical exam deferred        ?Extremities: Normal range of motion.  Edema: None  ?Mental Status: Normal mood and affect. Normal behavior. Normal judgment and thought content.  ? ? ? ?Assessment  ? ?31 y.o. [id number] at [redacted]w[redacted]d by  05/11/2022, by Ultrasound presenting for routine prenatal visit ? ?Plan  ? ?pregnancy  Problems (from 09/28/21 to present)   ? ? Problem Noted Resolved  ? Supervision of high risk pregnancy, antepartum 09/28/2021 by Imagene Riches, CNM No  ? Overview Addendum 12/10/2021  7:46 PM by Imagene Riches, CNM  ?   ?Nursing Staff Provider  ?Office Location  [facility]  sono on 1/25 [redacted]w[redacted]d, so EDD is 05/11/2022  ?Language  English Anatomy US  Normal female  ?Flu Vaccine   Genetic Screen  NIPS:   ?TDaP vaccine    Hgb A1C or  ?GTT Early : ?Third trimester :   ?Covid    LAB RESULTS   ?Rhogam   Blood Type   A negative  ?Feeding Plan Breast Antibody  negative  ?Contraception IUD?, vasectomy Rubella  immune  ?Circumcision  RPR   NR  ?Pediatrician   HBsAg   negative  ?Support Person steven HIV  negative  ?Prenatal Classes  Varicella immune  ?  GBS  (For PCN allergy, check sensitivities)   ?BTL Consent     ?VBAC Consent  Pap  08/01/2020- NILM  ?  Hgb Electro    ?  CF   ?   SMA   ?     ? ?  ?  ? ?  ?  ? ?Gestational age appropriate obstetric precautions including but not limited to vaginal bleeding, contractions, leaking of fluid and fetal movement were reviewed in detail with the patient.   ? ?Return in about 4 weeks (around 02/01/2022) for ROB in person. ? ?Homero Fellers MD ?[place], [place] ?01/04/2022, 9:26 AM ? ? ? ?

## 2022-02-01 ENCOUNTER — Encounter: Payer: BC Managed Care – PPO | Admitting: Licensed Practical Nurse

## 2022-02-01 ENCOUNTER — Encounter: Payer: Self-pay | Admitting: Advanced Practice Midwife

## 2022-02-01 ENCOUNTER — Ambulatory Visit (INDEPENDENT_AMBULATORY_CARE_PROVIDER_SITE_OTHER): Payer: BC Managed Care – PPO | Admitting: Advanced Practice Midwife

## 2022-02-01 VITALS — BP 104/60 | Wt 266.0 lb

## 2022-02-01 DIAGNOSIS — Z130 Encounter for screening for diseases of the blood and blood-forming organs and certain disorders involving the immune mechanism: Secondary | ICD-10-CM

## 2022-02-01 DIAGNOSIS — Z131 Encounter for screening for diabetes mellitus: Secondary | ICD-10-CM

## 2022-02-01 DIAGNOSIS — Z3A25 25 weeks gestation of pregnancy: Secondary | ICD-10-CM

## 2022-02-01 DIAGNOSIS — O0990 Supervision of high risk pregnancy, unspecified, unspecified trimester: Secondary | ICD-10-CM

## 2022-02-01 DIAGNOSIS — Z369 Encounter for antenatal screening, unspecified: Secondary | ICD-10-CM

## 2022-02-01 DIAGNOSIS — Z113 Encounter for screening for infections with a predominantly sexual mode of transmission: Secondary | ICD-10-CM

## 2022-02-01 DIAGNOSIS — O99212 Obesity complicating pregnancy, second trimester: Secondary | ICD-10-CM

## 2022-02-01 LAB — POCT URINALYSIS DIPSTICK OB
Glucose, UA: NEGATIVE
POC,PROTEIN,UA: NEGATIVE

## 2022-02-01 NOTE — Addendum Note (Signed)
Addended by: Donnetta Hail on: 02/01/2022 09:33 AM ? ? Modules accepted: Orders ? ?

## 2022-02-01 NOTE — Progress Notes (Signed)
Routine Prenatal Care Visit ? ?Subjective  ?Emily Payne is a 31 y.o. [phone] at [redacted]w[redacted]d being seen today for ongoing prenatal care.  She is currently monitored for the following issues for this high-risk pregnancy and has Obesity affecting pregnancy; Dandruff; Depression; Generalized anxiety disorder with panic attacks; S/P laparoscopic surgery; BMI 37.0-37.9, adult; and Supervision of high risk pregnancy, antepartum on their problem list.  ?----------------------------------------------------------------------------------- ?Patient reports no complaints.   ?Contractions: Not present. Vag. Bleeding: None.  Movement: Present. Leaking Fluid denies.  ?----------------------------------------------------------------------------------- ?The following portions of the patient's history were reviewed and updated as appropriate: allergies, current medications, past family history, past medical history, past social history, past surgical history and problem list. Problem list updated. ? ?Objective  ?Blood pressure 104/60, weight 266 lb (120.7 kg), last menstrual period 07/28/2021, currently breastfeeding. ?Pregravid weight 262 lb (118.8 kg) Total Weight Gain 4 lb (1.814 kg) ?Urinalysis: Urine Protein    Urine Glucose   ? ?Fetal Status: Fetal Heart Rate (bpm): 147 Fundal Height: 26 cm Movement: Present    ? ?General:  Alert, oriented and cooperative. Patient is in no acute distress.  ?Skin: Skin is warm and dry. No rash noted.   ?Cardiovascular: Normal heart rate noted  ?Respiratory: Normal respiratory effort, no problems with respiration noted  ?Abdomen: Soft, gravid, appropriate for gestational age. Pain/Pressure: Absent     ?Pelvic:  Cervical exam deferred        ?Extremities: Normal range of motion.  Edema: None  ?Mental Status: Normal mood and affect. Normal behavior. Normal judgment and thought content.  ? ?Assessment  ? ?30 y.o. [id number] at [redacted]w[redacted]d by  05/11/2022, by Ultrasound presenting for routine prenatal visit ? ?Plan   ? ?pregnancy Problems (from 09/28/21 to present)   ? Problem Noted Resolved  ? Supervision of high risk pregnancy, antepartum 09/28/2021 by Imagene Riches, CNM No  ? Overview Addendum 12/10/2021  7:46 PM by Imagene Riches, CNM  ?   ?Nursing Staff Provider  ?Office Location  [facility]  sono on 1/25 [redacted]w[redacted]d, so EDD is 05/11/2022  ?Language  English Anatomy US  Normal female  ?Flu Vaccine   Genetic Screen  NIPS:   ?TDaP vaccine    Hgb A1C or  ?GTT Early : ?Third trimester :   ?Covid    LAB RESULTS   ?Rhogam   Blood Type   A negative  ?Feeding Plan Breast Antibody  negative  ?Contraception IUD?, vasectomy Rubella  immune  ?Circumcision  RPR   NR  ?Pediatrician   HBsAg   negative  ?Support Person steven HIV  negative  ?Prenatal Classes  Varicella immune  ?  GBS  (For PCN allergy, check sensitivities)   ?BTL Consent     ?VBAC Consent  Pap  08/01/2020- NILM  ?  Hgb Electro    ?  CF   ?   SMA   ?     ? ? ?  ?  ?  ?  ? ?Preterm labor symptoms and general obstetric precautions including but not limited to vaginal bleeding, contractions, leaking of fluid and fetal movement were reviewed in detail with the patient. ?Please refer to After Visit Summary for other counseling recommendations.  ? ?Return in about 18 days (around 02/19/2022) for 28w labs and rob. ? ?Rod Can, CNM ?02/01/2022 9:30 AM   ? ?

## 2022-02-01 NOTE — Progress Notes (Signed)
ROB- no concerns 

## 2022-02-01 NOTE — Patient Instructions (Signed)

## 2022-02-06 ENCOUNTER — Other Ambulatory Visit: Payer: Self-pay

## 2022-02-06 ENCOUNTER — Encounter: Payer: Self-pay | Admitting: Obstetrics and Gynecology

## 2022-02-06 ENCOUNTER — Observation Stay
Admission: EM | Admit: 2022-02-06 | Discharge: 2022-02-06 | Disposition: A | Discharge status: Home / Self Care | Payer: BC Managed Care – PPO | Attending: Licensed Practical Nurse | Admitting: Licensed Practical Nurse

## 2022-02-06 ENCOUNTER — Observation Stay: Payer: BC Managed Care – PPO

## 2022-02-06 DIAGNOSIS — O0991 Supervision of high risk pregnancy, unspecified, first trimester: Secondary | ICD-10-CM | POA: Insufficient documentation

## 2022-02-06 DIAGNOSIS — O4693 Antepartum hemorrhage, unspecified, third trimester: Principal | ICD-10-CM | POA: Insufficient documentation

## 2022-02-06 DIAGNOSIS — O209 Hemorrhage in early pregnancy, unspecified: Secondary | ICD-10-CM | POA: Diagnosis not present

## 2022-02-06 DIAGNOSIS — O4690 Antepartum hemorrhage, unspecified, unspecified trimester: Secondary | ICD-10-CM | POA: Diagnosis present

## 2022-02-06 DIAGNOSIS — Z3A26 26 weeks gestation of pregnancy: Secondary | ICD-10-CM | POA: Insufficient documentation

## 2022-02-06 DIAGNOSIS — O4692 Antepartum hemorrhage, unspecified, second trimester: Secondary | ICD-10-CM | POA: Diagnosis not present

## 2022-02-06 DIAGNOSIS — O34219 Maternal care for unspecified type scar from previous cesarean delivery: Secondary | ICD-10-CM | POA: Diagnosis not present

## 2022-02-06 DIAGNOSIS — N939 Abnormal uterine and vaginal bleeding, unspecified: Secondary | ICD-10-CM | POA: Diagnosis not present

## 2022-02-06 DIAGNOSIS — O0990 Supervision of high risk pregnancy, unspecified, unspecified trimester: Secondary | ICD-10-CM

## 2022-02-06 DIAGNOSIS — R109 Unspecified abdominal pain: Secondary | ICD-10-CM

## 2022-02-06 DIAGNOSIS — O26892 Other specified pregnancy related conditions, second trimester: Secondary | ICD-10-CM

## 2022-02-06 LAB — URINALYSIS, COMPLETE (UACMP) WITH MICROSCOPIC
Bilirubin Urine: NEGATIVE
Glucose, UA: NEGATIVE mg/dL
Ketones, ur: NEGATIVE mg/dL
Leukocytes,Ua: NEGATIVE
Nitrite: NEGATIVE
Protein, ur: NEGATIVE mg/dL
Specific Gravity, Urine: 1.006 (ref 1.005–1.030)
pH: 6 (ref 5.0–8.0)

## 2022-02-06 LAB — ANTIBODY SCREEN: Antibody Screen: NEGATIVE

## 2022-02-06 LAB — CHLAMYDIA/NGC RT PCR (ARMC ONLY)
Chlamydia Tr: NOT DETECTED
N gonorrhoeae: NOT DETECTED

## 2022-02-06 LAB — WET PREP, GENITAL
Clue Cells Wet Prep HPF POC: NONE SEEN
Sperm: NONE SEEN
Trich, Wet Prep: NONE SEEN
WBC, Wet Prep HPF POC: >=10 — AB (ref <10)
Yeast Wet Prep HPF POC: NONE SEEN

## 2022-02-06 LAB — FETAL SCREEN: Fetal Screen: NEGATIVE

## 2022-02-06 LAB — ABO/RH: ABO/RH(D): A NEG

## 2022-02-06 MED ORDER — RHO D IMMUNE GLOBULIN 1500 UNIT/2ML IJ SOSY
300.0000 ug | PREFILLED_SYRINGE | Freq: Once | INTRAMUSCULAR | Status: AC
  Administered 2022-02-06: 300 ug via INTRAMUSCULAR
  Filled 2022-02-06: qty 2

## 2022-02-06 NOTE — Progress Notes (Signed)
Pt transported to [place] with transport.

## 2022-02-06 NOTE — Discharge Summary (Signed)
Physician Final Progress Note  Patient ID: Emily Payne MRN: [id number] DOB/AGE: 1991-03-17 30 y.o.  Admit date: 02/06/2022 Admitting provider: Hildred Laser, MD Discharge date: 02/06/2022   Admission Diagnoses:  1) intrauterine pregnancy at [redacted]w[redacted]d  2) vaginal bleeding  3) abdominal cramping   Discharge Diagnoses:  Principal Problem:   Vaginal bleeding during pregnancy  SIUP without evidence of a placental abruption   History of Present Illness: The patient is a 31 y.o. female [phone] at [redacted]w[redacted]d by Ultrasound presenting to L&D for vaginal bleeding and abdominal cramping since last night.  Around 7pm she had some period like cramping, went to the bathroom and saw bright red blood in the toilet and some blood on her bathroom tissue. She continued to see bright red bleeding every time she used the bathroom over night (total of 5 times), but never had bleeding between episodes. The cramping persists, it has not gotten worse or improved. She does have some nausea, but feels it is because she has not eaten yet. Denies any leakage of fluid. Is unaware if she has hemorrhoids, has had "normal" BM's with occasional constipation that resolves with increasing her fluids.  Endorses +FM. Last IC "about a week ago", denies any falls or injures to her abdomen. Her OB history significant for previous c/s followed by a successful VBAC which resulted in a cervical laceration.  Her most recent US shows an anterior placenta with a cervical length of 3.77cm.     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 with aura    Poor fetal growth affecting management of mother in third trimester    Single liveborn infant delivered vaginally 06/09/2020   Supervision of high risk pregnancy in first trimester 11/14/2019     Nursing Staff Provider  Office Location  [facility]ating   LMP=7wk [place]  Language  English Anatomy US  Incomplete, suboptimal scan with MFM, close exam after birth advised  Flu Vaccine   Genetic Screen  NIPS:normal XX  TDaP vaccine   given 7/16 Hgb A1C or  GTT Early :102 Third trimester :   Rhogam  03/07/20   LAB RESULTS   Feeding Plan  breast Blood Type A/Negative/-- (02/24 1026)   Contraception  A   VBAC, delivered, current hospitalization 06/09/2020    Past Surgical History:  Procedure Laterality Date   abscess tooth     CESAREAN SECTION N/A 10/01/2014   Procedure: PRIMARY CESAREAN SECTION;  Surgeon: Adam Phenix, MD;  Location: [facility];  Service: Obstetrics;  Laterality: N/A;   DIAGNOSTIC LAPAROSCOPY WITH REMOVAL OF ECTOPIC PREGNANCY N/A 06/27/2019   Procedure: DIAGNOSTIC LAPAROSCOPY , evacuation or hemoperitoneum;  Surgeon: Natale Milch, MD;  Location: [facility] ORS;  Service: Gynecology;  Laterality: N/A;   LAPAROSCOPIC UNILATERAL SALPINGECTOMY Right 06/27/2019   Procedure: LAPAROSCOPIC UNILATERAL SALPINGECTOMY;  Surgeon: Natale Milch, MD;  Location: [facility] ORS;  Service: Gynecology;  Laterality: Right;   TONSILLECTOMY     WISDOM TOOTH EXTRACTION      No current facility-administered medications on file prior to encounter.   Current Outpatient Medications on File Prior to Encounter  Medication Sig Dispense Refill   acetaminophen (TYLENOL) 325 MG tablet Take 650 mg by mouth every 6 (  six) hours as needed.     albuterol (PROVENTIL) (2.5 MG/3ML) 0.083% nebulizer solution Take 2.5 mg by nebulization every 6 (six) hours as needed for wheezing or shortness of breath.     escitalopram (LEXAPRO) 20 MG tablet Take 1 tablet (20 mg total) by mouth daily. After 2 weeks, if tolerating, increase to 20mg  daily. 90 tablet 3   Prenatal Vit-Fe Fumarate-FA (PRENATAL MULTIVITAMIN) TABS tablet Take 1 tablet by mouth daily at 12 noon.     LORazepam (ATIVAN) 0.5 MG tablet Take 1 tablet (0.5 mg  total) by mouth daily as needed for anxiety. (Patient not taking: Reported on 01/04/2022) 20 tablet 0    Allergies  Allergen Reactions   Sulfa Drugs Cross Reactors Anaphylaxis, Itching and Other (See Comments)    Flu like symptoms    Social History   Socioeconomic History   Marital status: Married    Spouse name: 01/06/2022   Number of children: 4   Years of education: 17   Highest education level: Bachelor's degree (e.g., BA, AB, BS)  Occupational History   Occupation: Self Employed  Tobacco Use   Smoking status: Never   Smokeless tobacco: Never  Vaping Use   Vaping Use: Never used  Substance and Sexual Activity   Alcohol use: Not Currently    Alcohol/week: 0.0 standard drinks    Comment: rare but none with pregnancy   Drug use: No   Sexual activity: Yes    Partners: Male    Birth control/protection: None  Other Topics Concern   Not on file  Social History Narrative         Has two step children- ages 57 and 87 that permanently stays with her. Patient also has a biological 65 yr old son. 80 month old . (12/01/21)   Social Determinants of Health   Financial Resource Strain: Not on file  Food Insecurity: Not on file  Transportation Needs: Not on file  Physical Activity: Not on file  Stress: Not on file  Social Connections: Not on file  Intimate Partner Violence: Not on file    Family History  Problem Relation Age of Onset   Ovarian cysts Mother    Stroke Maternal Grandmother    Cancer Maternal Grandmother 27       uterine cancer   Lung cancer Paternal Grandfather    Diabetes Maternal Great-grandmother    Lung cancer Maternal Great-grandmother      Review of Systems  Constitutional: Negative.   Respiratory: Negative.    Cardiovascular: Negative.   Gastrointestinal:  Positive for abdominal pain and nausea.  Genitourinary: Negative.   Musculoskeletal:  Positive for back pain.  Neurological: Negative.   Psychiatric/Behavioral: Negative.      Physical  Exam: BP 127/71 (BP Location: Left Arm)   Pulse 80   Temp 98.5 F (36.9 C) (Oral)   Resp 20   Ht 5\' 10"  (1.778 m)   Wt 120.7 kg   LMP 07/28/2021 (Exact Date)   SpO2 98%   BMI 38.17 kg/m   Physical Exam Constitutional:      Appearance: Normal appearance.  Genitourinary:     Vulva normal.     Genitourinary Comments: SSE: cervix pink no lesions, visually closed, physiologic discharge present, no bleeding present   Abdominal:     General: There is no distension.     Tenderness: There is no abdominal tenderness.     Comments: Gravid, soft, fundal height 27cm   Neurological:     General: No focal deficit  present.     Mental Status: She is alert and oriented to person, place, and time.  Skin:    General: Skin is warm.  Psychiatric:        Mood and Affect: Mood normal.        Behavior: Behavior normal.   FHT: baseline 145, moderate variability, pos accel, neg decel  Toco: none     Consults: None  Significant Findings/ Diagnostic Studies: labs: UA small hgb,   Procedures: RNST  US findings: MATERNAL FINDINGS:   Cervix:  Measures 4.1 cm in length transvaginally   Uterus/Adnexae:  No abnormality visualized.   IMPRESSION: Single living intrauterine fetus in cephalic presentation.   No evidence of placental abruption or previa.   Normal cervical length. Hospital Course: The patient was admitted to Labor and Delivery Triage for observation. Pt did not have tenderness with palpation of the abdomen or uterus, SSE showed no bleeding, wet prep with some WBC's otherwise negative, US reassuring with a cervical length of 41.cm and no evidence of previa or abruption. Pt received rhogam. She continues to have some occasional cramping.  Discharge Condition: stable  Disposition: Discharge disposition: 01-Home or Self Care     discharge home  Comfort measures for cramping reviewed   Diet: Regular diet  Discharge Activity: Activity as tolerated   Allergies as of 02/06/2022        Reactions   Sulfa Drugs Cross Reactors Anaphylaxis, Itching, Other (See Comments)   Flu like symptoms        Medication List     STOP taking these medications    LORazepam 0.5 MG tablet Commonly known as: ATIVAN       TAKE these medications    acetaminophen 325 MG tablet Commonly known as: TYLENOL Take 650 mg by mouth every 6 (six) hours as needed.   albuterol (2.5 MG/3ML) 0.083% nebulizer solution Commonly known as: PROVENTIL Take 2.5 mg by nebulization every 6 (six) hours as needed for wheezing or shortness of breath.   escitalopram 20 MG tablet Commonly known as: LEXAPRO Take 1 tablet (20 mg total) by mouth daily. After 2 weeks, if tolerating, increase to 20mg  daily.   prenatal multivitamin Tabs tablet Take 1 tablet by mouth daily at 12 noon.          SignedEllouise Newer: Nikayla Madaris M Irish Piech, CNM  02/06/2022, 11:06 AM

## 2022-02-06 NOTE — Progress Notes (Signed)
Pt discharged home per order.   Pt stable and ambulatory and an After Visit Summary was printed and given to the patient. Discharge education completed with patient/family including follow up instructions, appointments, and medication list. Pt received labor and bleeding precautions. Patient able to verbalize understanding, all questions fully answered upon discharge.Pt discharged home via personal vehicle with support person.   Pt reports no current abdominal cramping at time of discharge.

## 2022-02-06 NOTE — Progress Notes (Addendum)
Pt presented to L/D triage with noted bright red spotting with wiping at 1900 last night and again this morning. Pt reports intermittent abdominal cramping, rated 4/10. Pt reports no urinary discomfort or recent intercourse. Pt reports mild constipation with no noted concerns.She reports positive fetal movement and no LOF.   Monitors applied and assessing. Initial FHT 147- Pt notes no abd cramping since laying down.

## 2022-02-06 NOTE — Progress Notes (Signed)
Obstetric H&P   Chief Complaint: vaginal bleeding and cramping   Prenatal Care Provider: [facility]  History of Present Illness: [age] y.o. [id number] 6554w4d by 05/11/2022, by Ultrasound presenting to L&D for vaginal bleeding and abdominal cramping since last night.  Around 7pm she had some period like cramping, went to the bathroom and saw bright red blood in the toilet and some blood on her bathroom tissue. She continued to see bright red bleeding every time she used the bathroom over night (total of 5 times), but never had bleeding between episodes. The cramping persists, it has not gotten worse or improved. She does have some nausea, but feels it is because she has not eaten yet. Denies any leakage of fluid. Is unaware if she has hemorrhoids, has had "normal" BM's with occasional constipation that resolves with increasing her fluids.  Endorses +FM. Last IC "about a week ago", denies any falls or injures to her abdomen. Her OB history significant for previous c/s followed by a successful VBAC which resulted in a cervical laceration.  Her most recent US shows an anterior placenta with a cervical length of 3.77cm.   Pregravid weight 118.8 kg Total Weight Gain 1.814 kg  pregnancy Problems (from 09/28/21 to present)     Problem Noted Resolved   Supervision of high risk pregnancy, antepartum 09/28/2021 by Mirna MiresFryer, Margaret M, CNM No   Overview Addendum 12/10/2021  7:46 PM by Mirna MiresFryer, Margaret M, CNM     Nursing Staff Provider  Office Location  [facility] Dating  sono on 1/25 8924w1d, so EDD is 05/11/2022  Language  English Anatomy US  Normal female  Flu Vaccine   Genetic Screen  NIPS:   TDaP vaccine    Hgb A1C or  GTT Early : Third trimester :   Covid    LAB RESULTS   Rhogam   Blood Type   A negative  Feeding Plan Breast Antibody  negative  Contraception IUD?, vasectomy Rubella  immune  Circumcision  RPR   NR  Pediatrician   HBsAg   negative  Support Person steven HIV  negative  Prenatal Classes  Varicella  immune    GBS  (For PCN allergy, check sensitivities)   BTL Consent     VBAC Consent  Pap  08/01/2020- NILM    Hgb Electro      CF      SMA                   Review of Systems: 10 point review of systems negative unless otherwise noted in HPI  Past Medical History: Patient Active Problem List   Diagnosis Date Noted   Vaginal bleeding during pregnancy 02/06/2022   Supervision of high risk pregnancy, antepartum 09/28/2021     Nursing Staff Provider  Office Location  [facility] Dating  sono on 1/25 7424w1d, so EDD is 05/11/2022  Language  English Anatomy US  Normal female  Flu Vaccine   Genetic Screen  NIPS:   TDaP vaccine    Hgb A1C or  GTT Early : Third trimester :   Covid    LAB RESULTS   Rhogam   Blood Type   A negative  Feeding Plan Breast Antibody  negative  Contraception IUD?, vasectomy Rubella  immune  Circumcision  RPR   NR  Pediatrician   HBsAg   negative  Support Person steven HIV  negative  Prenatal Classes  Varicella immune    GBS  (For PCN allergy, check sensitivities)   BTL  Consent     VBAC Consent  Pap  08/01/2020- NILM    Hgb Electro      CF      SMA            BMI 37.0-37.9, adult 01/23/2020   S/P laparoscopic surgery 06/28/2019   Generalized anxiety disorder with panic attacks 08/31/2018   Depression 02/21/2015   Dandruff 06/22/2013   Obesity affecting pregnancy 07/04/2012    Referred to nutritionist -- did not attend Jan 2020      Past Surgical History: Past Surgical History:  Procedure Laterality Date   abscess tooth     CESAREAN SECTION N/A 10/01/2014   Procedure: PRIMARY CESAREAN SECTION;  Surgeon: Adam Phenix, MD;  Location: [facility];  Service: Obstetrics;  Laterality: N/A;   DIAGNOSTIC LAPAROSCOPY WITH REMOVAL OF ECTOPIC PREGNANCY N/A 06/27/2019   Procedure: DIAGNOSTIC LAPAROSCOPY , evacuation or hemoperitoneum;  Surgeon: Natale Milch, MD;  Location: [facility];  Service: Gynecology;  Laterality: N/A;   LAPAROSCOPIC UNILATERAL  SALPINGECTOMY Right 06/27/2019   Procedure: LAPAROSCOPIC UNILATERAL SALPINGECTOMY;  Surgeon: Natale Milch, MD;  Location: [facility];  Service: Gynecology;  Laterality: Right;   TONSILLECTOMY     WISDOM TOOTH EXTRACTION      Past Obstetric History: # 1 - Date: None, Sex: None, Weight: None, GA: None, Delivery: None, Apgar1: None, Apgar5: None, Living: None, Birth Comments: None  # 2 - Date: 10/01/14, Sex: Female, Weight: 3100 g, GA: [redacted]w[redacted]d, Delivery: C-Section, Low Transverse, Apgar1: 9, Apgar5: 9, Living: Living, Birth Comments: None  # 3 - Date: 06/09/20, Sex: Female, Weight: 3810 g, GA: [redacted]w[redacted]d, Delivery: Vaginal, Spontaneous, Apgar1: 6, Apgar5: 7, Living: Living, Birth Comments: None  # 4 - Date: None, Sex: None, Weight: None, GA: None, Delivery: None, Apgar1: None, Apgar5: None, Living: None, Birth Comments: None   Past Gynecologic History:  Family History: Family History  Problem Relation Age of Onset   Ovarian cysts Mother    Stroke Maternal Grandmother    Cancer Maternal Grandmother 27       uterine cancer   Lung cancer Paternal Grandfather    Diabetes Maternal Great-grandmother    Lung cancer Maternal Great-grandmother     Social History: Social History   Socioeconomic History   Marital status: Married    Spouse name: Viviann Spare   Number of children: 4   Years of education: 17   Highest education level: Bachelor's degree (e.g., BA, AB, BS)  Occupational History   Occupation: Self Employed  Tobacco Use   Smoking status: Never   Smokeless tobacco: Never  Vaping Use   Vaping Use: Never used  Substance and Sexual Activity   Alcohol use: Not Currently    Alcohol/week: 0.0 standard drinks    Comment: rare but none with pregnancy   Drug use: No   Sexual activity: Yes    Partners: Male    Birth control/protection: None  Other Topics Concern   Not on file  Social History Narrative         Has two step children- ages 36 and 89 that permanently stays with her.  Patient also has a biological 17 yr old son. 52 month old . (12/01/21)   Social Determinants of Health   Financial Resource Strain: Not on file  Food Insecurity: Not on file  Transportation Needs: Not on file  Physical Activity: Not on file  Stress: Not on file  Social Connections: Not on file  Intimate Partner Violence: Not on file  Medications: Prior to Admission medications   Medication Sig Start Date End Date Taking? Authorizing Provider  acetaminophen (TYLENOL) 325 MG tablet Take 650 mg by mouth every 6 (six) hours as needed.   Yes [provider]  albuterol (PROVENTIL) (2.5 MG/3ML) 0.083% nebulizer solution Take 2.5 mg by nebulization every 6 (six) hours as needed for wheezing or shortness of breath.   Yes [provider]  escitalopram (LEXAPRO) 20 MG tablet Take 1 tablet (20 mg total) by mouth daily. After 2 weeks, if tolerating, increase to 20mg  daily. 05/12/21  Yes 05/14/21, DO  Prenatal Vit-Fe Fumarate-FA (PRENATAL MULTIVITAMIN) TABS tablet Take 1 tablet by mouth daily at 12 noon.   Yes [provider]  LORazepam (ATIVAN) 0.5 MG tablet Take 1 tablet (0.5 mg total) by mouth daily as needed for anxiety. Patient not taking: Reported on 01/04/2022 08/11/21   08/13/21, PA-C    Allergies: Allergies  Allergen Reactions   Sulfa Drugs Cross Reactors Anaphylaxis, Itching and Other (See Comments)    Flu like symptoms    Physical Exam: Vitals: Blood pressure 127/71, pulse 80, temperature 98.5 F (36.9 C), temperature source Oral, resp. rate 20, height 5\' 10"  (1.778 m), weight 120.7 kg, last menstrual period 07/28/2021, SpO2 98 %, currently breastfeeding.  Urine Dip Protein:   Latest Reference Range & Units 02/06/22 08:54  Appearance CLEAR  CLEAR !  Bilirubin Urine NEGATIVE  NEGATIVE  Color, Urine YELLOW  STRAW !  Glucose, UA NEGATIVE mg/dL NEGATIVE  Hgb urine dipstick NEGATIVE  SMALL !  Ketones, ur NEGATIVE mg/dL NEGATIVE  Leukocytes,Ua  NEGATIVE  NEGATIVE  Nitrite NEGATIVE  NEGATIVE  pH 5.0 - 8.0  6.0  Protein NEGATIVE mg/dL NEGATIVE  Specific Gravity, Urine 1.005 - 1.030  1.006    FHT: baseline 145, moderate variability, pos accel, neg decel  Toco: none   General: NAD HEENT: normocephalic, anicteric Pulmonary: No increased work of breathing Cardiovascular: RRR, distal pulses 2+ Abdomen: Gravid, non-tender, fundal ht 27cm  [place]: Genitourinary: SSE cervix pink visually closed, no lesions, physiologic discharge present, no bleeding present.  Extremities: no edema, erythema, or tenderness Neurologic: Grossly intact Psychiatric: mood appropriate, affect full  Labs: No results found for this or any previous visit (from the past 24 hour(s)).  Assessment: 31 y.o. 02/08/22 [redacted]w[redacted]d by 05/11/2022, by Ultrasound with vagainl bleeding and abdominal pain.   Plan: 1)Rh negative-antibody screen ordered, [redacted]w[redacted]d ordered, wet prep and gc/ct collected   2) Fetus -RNST  3) PNL - Blood type A/Negative/-- (02/07 0958) / Anti-bodyscreen Negative (02/07 0958) / Rubella 5.60 (02/07 0958) / Varicella immune / RPR Non Reactive (02/07 0958) / HBsAg Negative (02/07 11-09-1973) / HIV Non Reactive (02/07 0958) / 1-hr OGTT 132 / GBS    4) Immunization History -  Immunization History  Administered Date(s) Administered   Influenza,inj,Quad PF,6+ Mos 06/22/2013, 06/21/2014   Influenza-Unspecified 07/19/2017, 08/29/2019   Tdap 07/18/2014, 04/04/2020    5) Disposition - plan discharge home once 07/20/2014 complete and rhogam given   04/06/2020 CNM  [facility], [facility] 02/06/2022, 8:58 AM

## 2022-02-07 LAB — RHOGAM INJECTION: Unit division: 0

## 2022-02-07 LAB — URINE CULTURE: Culture: NO GROWTH

## 2022-02-18 ENCOUNTER — Encounter: Payer: BC Managed Care – PPO | Admitting: Obstetrics

## 2022-02-18 ENCOUNTER — Other Ambulatory Visit: Payer: BC Managed Care – PPO

## 2022-02-23 ENCOUNTER — Encounter: Payer: BC Managed Care – PPO | Admitting: Obstetrics

## 2022-02-23 ENCOUNTER — Other Ambulatory Visit: Payer: BC Managed Care – PPO

## 2022-03-02 ENCOUNTER — Other Ambulatory Visit: Payer: Self-pay

## 2022-03-02 ENCOUNTER — Ambulatory Visit (INDEPENDENT_AMBULATORY_CARE_PROVIDER_SITE_OTHER): Payer: BC Managed Care – PPO | Admitting: Family Medicine

## 2022-03-02 ENCOUNTER — Other Ambulatory Visit: Payer: BC Managed Care – PPO

## 2022-03-02 VITALS — BP 122/66 | Wt 273.0 lb

## 2022-03-02 DIAGNOSIS — O0990 Supervision of high risk pregnancy, unspecified, unspecified trimester: Secondary | ICD-10-CM

## 2022-03-02 DIAGNOSIS — Z98891 History of uterine scar from previous surgery: Secondary | ICD-10-CM

## 2022-03-02 DIAGNOSIS — Z113 Encounter for screening for infections with a predominantly sexual mode of transmission: Secondary | ICD-10-CM

## 2022-03-02 DIAGNOSIS — Z369 Encounter for antenatal screening, unspecified: Secondary | ICD-10-CM

## 2022-03-02 DIAGNOSIS — O99212 Obesity complicating pregnancy, second trimester: Secondary | ICD-10-CM | POA: Diagnosis not present

## 2022-03-02 DIAGNOSIS — O09299 Supervision of pregnancy with other poor reproductive or obstetric history, unspecified trimester: Secondary | ICD-10-CM

## 2022-03-02 DIAGNOSIS — Z130 Encounter for screening for diseases of the blood and blood-forming organs and certain disorders involving the immune mechanism: Secondary | ICD-10-CM | POA: Diagnosis not present

## 2022-03-02 DIAGNOSIS — Z3A30 30 weeks gestation of pregnancy: Secondary | ICD-10-CM

## 2022-03-02 DIAGNOSIS — Z131 Encounter for screening for diabetes mellitus: Secondary | ICD-10-CM

## 2022-03-02 DIAGNOSIS — K219 Gastro-esophageal reflux disease without esophagitis: Secondary | ICD-10-CM

## 2022-03-02 DIAGNOSIS — O34219 Maternal care for unspecified type scar from previous cesarean delivery: Secondary | ICD-10-CM

## 2022-03-02 DIAGNOSIS — O99619 Diseases of the digestive system complicating pregnancy, unspecified trimester: Secondary | ICD-10-CM

## 2022-03-02 DIAGNOSIS — Z23 Encounter for immunization: Secondary | ICD-10-CM

## 2022-03-02 DIAGNOSIS — O99213 Obesity complicating pregnancy, third trimester: Secondary | ICD-10-CM

## 2022-03-02 DIAGNOSIS — O4690 Antepartum hemorrhage, unspecified, unspecified trimester: Secondary | ICD-10-CM

## 2022-03-02 NOTE — Progress Notes (Signed)
    PRENATAL VISIT NOTE  Subjective:  Emily Payne is a 31 y.o. [phone] at [redacted]w[redacted]d being seen today for ongoing prenatal care.  She is currently monitored for the following issues for this high-risk pregnancy and has Obesity affecting pregnancy; Dandruff; Depression; Generalized anxiety disorder with panic attacks; S/P laparoscopic surgery; BMI 37.0-37.9, adult; Supervision of high risk pregnancy, antepartum; Vaginal bleeding during pregnancy; Previous cesarean delivery affecting pregnancy, antepartum; History of VBAC; and History of postpartum hemorrhage, currently pregnant on their problem list.  Patient reports  see at [facility] for vaginal bleeding 3 wks ag o none since. Reports upper abdominal pain, worse at night and with reclining. Eating sometimes worsens .  Contractions: Not present. Vag. Bleeding: None.  Movement: Present. Denies leaking of fluid.   The following portions of the patient's history were reviewed and updated as appropriate: allergies, current medications, past family history, past medical history, past social history, past surgical history and problem list.   Objective:   Vitals:   03/02/22 0909  BP: 122/66  Weight: 273 lb (123.8 kg)    Fetal Status:     Movement: Present     General:  Alert, oriented and cooperative. Patient is in no acute distress.  Skin: Skin is warm and dry. No rash noted.   Cardiovascular: Normal heart rate noted  Respiratory: Normal respiratory effort, no problems with respiration noted  Abdomen: Soft, gravid, appropriate for gestational age.  Pain/Pressure: Absent     Pelvic: Cervical exam deferred        Extremities: Normal range of motion.     Mental Status: Normal mood and affect. Normal behavior. Normal judgment and thought content.   Assessment and Plan:  Pregnancy: [id number] at [redacted]w[redacted]d 1. Need for tetanus booster - Tdap vaccine greater than or equal to 7yo IM  2. [redacted] weeks gestation of pregnancy - POC Urinalysis Dipstick OB  3.  Supervision of high risk pregnancy, antepartum Up to date 3rd trimester labs today Reviewed she is going to breastmilk feed, wants IUD and partner is having vasectomy, peds office updated Having GERD in preg-- recommended H2 blocker and tums  4. Vaginal bleeding during pregnancy Resolved, counseled to return if reoccurs  5. Obesity affecting pregnancy in third trimester TWG=11 lb (4.99 kg) which is within goal  6. Previous cesarean delivery affecting pregnancy, antepartum Desires TOLAC-- signed consent today  7. History of VBAC TOLAC consent   8. History of postpartum hemorrhage, currently pregnant Due to [facility] LD aware  Preterm labor symptoms and general obstetric precautions including but not limited to vaginal bleeding, contractions, leaking of fluid and fetal movement were reviewed in detail with the patient. Please refer to After Visit Summary for other counseling recommendations.   Return in about 2 weeks (around 03/16/2022) for Routine prenatal care.  Future Appointments  Date Time Provider [place]  03/15/2022  8:15 AM Imagene Riches, CNM [facility] None  03/16/2022 10:00 AM [facility] [place]  03/18/2022  8:20 AM Delsa Grana, PA-C [place] [facility]  03/29/2022  9:20 AM Delsa Grana, PA-C [place] [facility]    Caren Macadam, MD

## 2022-03-02 NOTE — Patient Instructions (Signed)
Safe Medications in Pregnancy   Indigestion: Tums Maalox Mylanta Zantac  Pepcid

## 2022-03-05 LAB — 28 WEEKS RH-PANEL
Basophils Absolute: 0 10*3/uL (ref 0.0–0.2)
Basos: 0 %
EOS (ABSOLUTE): 0 10*3/uL (ref 0.0–0.4)
Eos: 0 %
Gestational Diabetes Screen: 100 mg/dL (ref 70–139)
HIV Screen 4th Generation wRfx: NONREACTIVE
Hematocrit: 39.2 % (ref 34.0–46.6)
Hemoglobin: 13.5 g/dL (ref 11.1–15.9)
Immature Grans (Abs): 0 10*3/uL (ref 0.0–0.1)
Immature Granulocytes: 0 %
Lymphocytes Absolute: 2.6 10*3/uL (ref 0.7–3.1)
Lymphs: 24 %
MCH: 31.6 pg (ref 26.6–33.0)
MCHC: 34.4 g/dL (ref 31.5–35.7)
MCV: 92 fL (ref 79–97)
Monocytes Absolute: 0.4 10*3/uL (ref 0.1–0.9)
Monocytes: 4 %
Neutrophils Absolute: 7.5 10*3/uL — ABNORMAL HIGH (ref 1.4–7.0)
Neutrophils: 72 %
Platelets: 233 10*3/uL (ref 150–450)
RBC: 4.27 x10E6/uL (ref 3.77–5.28)
RDW: 12.8 % (ref 11.7–15.4)
RPR Ser Ql: NONREACTIVE
WBC: 10.5 10*3/uL (ref 3.4–10.8)

## 2022-03-05 LAB — AB SCR+ANTIBODY ID: Antibody Screen: POSITIVE — AB

## 2022-03-11 ENCOUNTER — Other Ambulatory Visit: Payer: Self-pay

## 2022-03-11 DIAGNOSIS — O99213 Obesity complicating pregnancy, third trimester: Secondary | ICD-10-CM

## 2022-03-11 DIAGNOSIS — O34219 Maternal care for unspecified type scar from previous cesarean delivery: Secondary | ICD-10-CM

## 2022-03-15 ENCOUNTER — Ambulatory Visit (INDEPENDENT_AMBULATORY_CARE_PROVIDER_SITE_OTHER): Payer: BC Managed Care – PPO | Admitting: Obstetrics

## 2022-03-15 VITALS — BP 128/80 | Wt 272.0 lb

## 2022-03-15 DIAGNOSIS — O0990 Supervision of high risk pregnancy, unspecified, unspecified trimester: Secondary | ICD-10-CM

## 2022-03-15 DIAGNOSIS — Z3A31 31 weeks gestation of pregnancy: Secondary | ICD-10-CM

## 2022-03-15 NOTE — Progress Notes (Signed)
No vb. No lof.  

## 2022-03-16 ENCOUNTER — Ambulatory Visit: Payer: BC Managed Care – PPO | Attending: Maternal & Fetal Medicine

## 2022-03-16 ENCOUNTER — Other Ambulatory Visit: Payer: Self-pay

## 2022-03-16 DIAGNOSIS — O99213 Obesity complicating pregnancy, third trimester: Secondary | ICD-10-CM | POA: Insufficient documentation

## 2022-03-16 DIAGNOSIS — O34219 Maternal care for unspecified type scar from previous cesarean delivery: Secondary | ICD-10-CM

## 2022-03-16 DIAGNOSIS — Z3A32 32 weeks gestation of pregnancy: Secondary | ICD-10-CM | POA: Diagnosis not present

## 2022-03-18 ENCOUNTER — Encounter: Payer: BC Managed Care – PPO | Admitting: Family Medicine

## 2022-03-29 ENCOUNTER — Ambulatory Visit (INDEPENDENT_AMBULATORY_CARE_PROVIDER_SITE_OTHER): Payer: BC Managed Care – PPO | Admitting: Obstetrics & Gynecology

## 2022-03-29 ENCOUNTER — Ambulatory Visit: Payer: BC Managed Care – PPO | Admitting: Family Medicine

## 2022-03-29 VITALS — BP 120/80 | Wt 274.0 lb

## 2022-03-29 DIAGNOSIS — O99212 Obesity complicating pregnancy, second trimester: Secondary | ICD-10-CM

## 2022-03-29 DIAGNOSIS — O34219 Maternal care for unspecified type scar from previous cesarean delivery: Secondary | ICD-10-CM

## 2022-03-29 DIAGNOSIS — Z3A33 33 weeks gestation of pregnancy: Secondary | ICD-10-CM

## 2022-03-29 DIAGNOSIS — O0990 Supervision of high risk pregnancy, unspecified, unspecified trimester: Secondary | ICD-10-CM

## 2022-03-29 LAB — POCT URINALYSIS DIPSTICK OB: Glucose, UA: NEGATIVE

## 2022-03-29 NOTE — Progress Notes (Signed)
Subjective:    Emily Payne is a 31 y.o. married G3P2 being seen today for her obstetrical visit. She is at [redacted]w[redacted]d gestation. Patient reports no complaints. Fetal movement: normal. She and her husband don't want more children after this baby. He has a consult for a vasectomy tomorrow.   Review of Systems:   Review of Systems   Objective:    BP 120/80   Wt 274 lb (124.3 kg)   LMP 07/28/2021 (Exact Date)   BMI 39.31 kg/m   Physical Exam  Exam  Well nourished, well hydrated White female, no apparent distress She is ambulating and conversing normally.   FHT:  140s BPM  Uterine Size: 32  Presentation: breech     Assessment:    Pregnancy:  [id number]    Plan:    Patient Active Problem List   Diagnosis Date Noted  . Previous cesarean delivery affecting pregnancy, antepartum 03/02/2022  . History of VBAC 03/02/2022  . History of postpartum hemorrhage, currently pregnant 03/02/2022  . Vaginal bleeding during pregnancy 02/06/2022  . Supervision of high risk pregnancy, antepartum 09/28/2021  . BMI 37.0-37.9, adult 01/23/2020  . S/P laparoscopic surgery 06/28/2019  . Generalized anxiety disorder with panic attacks 08/31/2018  . Depression 02/21/2015  . Dandruff 06/22/2013  . Obesity affecting pregnancy 07/04/2012     She is planning for a TOLAC. If baby is still breech at next visit, she may be interested in a version. Labor precautions reviewed. GBS culture at next visit.

## 2022-04-13 ENCOUNTER — Encounter: Payer: Self-pay | Admitting: Advanced Practice Midwife

## 2022-04-13 ENCOUNTER — Other Ambulatory Visit (HOSPITAL_COMMUNITY)
Admission: RE | Admit: 2022-04-13 | Discharge: 2022-04-13 | Disposition: A | Discharge status: Home / Self Care | Payer: BC Managed Care – PPO | Source: Ambulatory Visit | Attending: Advanced Practice Midwife | Admitting: Advanced Practice Midwife

## 2022-04-13 ENCOUNTER — Ambulatory Visit (INDEPENDENT_AMBULATORY_CARE_PROVIDER_SITE_OTHER): Payer: BC Managed Care – PPO | Admitting: Advanced Practice Midwife

## 2022-04-13 VITALS — BP 120/80 | Wt 279.0 lb

## 2022-04-13 DIAGNOSIS — O99213 Obesity complicating pregnancy, third trimester: Secondary | ICD-10-CM

## 2022-04-13 DIAGNOSIS — Z113 Encounter for screening for infections with a predominantly sexual mode of transmission: Secondary | ICD-10-CM | POA: Insufficient documentation

## 2022-04-13 DIAGNOSIS — Z369 Encounter for antenatal screening, unspecified: Secondary | ICD-10-CM

## 2022-04-13 DIAGNOSIS — Z3A36 36 weeks gestation of pregnancy: Secondary | ICD-10-CM

## 2022-04-13 DIAGNOSIS — O0990 Supervision of high risk pregnancy, unspecified, unspecified trimester: Secondary | ICD-10-CM

## 2022-04-13 DIAGNOSIS — Z3685 Encounter for antenatal screening for Streptococcus B: Secondary | ICD-10-CM | POA: Diagnosis not present

## 2022-04-13 LAB — POCT URINALYSIS DIPSTICK OB
Glucose, UA: NEGATIVE
POC,PROTEIN,UA: NEGATIVE

## 2022-04-13 NOTE — Progress Notes (Signed)
Routine Prenatal Care Visit  Subjective  Emily Payne is a 31 y.o. [phone] at [redacted]w[redacted]d being seen today for ongoing prenatal care.  She is currently monitored for the following issues for this high-risk pregnancy and has Obesity affecting pregnancy; Dandruff; Depression; Generalized anxiety disorder with panic attacks; S/P laparoscopic surgery; BMI 37.0-37.9, adult; Supervision of high risk pregnancy, antepartum; Vaginal bleeding during pregnancy; Previous cesarean delivery affecting pregnancy, antepartum; History of VBAC; and History of postpartum hemorrhage, currently pregnant on their problem list.  ----------------------------------------------------------------------------------- Patient reports no complaints.  Discussed ECV for breech presentation. She would like to attempt. Contractions: Irregular. Vag. Bleeding: None.  Movement: Present. Leaking Fluid denies.  ----------------------------------------------------------------------------------- The following portions of the patient's history were reviewed and updated as appropriate: allergies, current medications, past family history, past medical history, past social history, past surgical history and problem list. Problem list updated.  Objective  Blood pressure 120/80, weight 279 lb (126.6 kg), last menstrual period 07/28/2021 Pregravid weight 262 lb (118.8 kg) Total Weight Gain 17 lb (7.711 kg) Urinalysis: Urine Protein    Urine Glucose    Fetal Status: Fetal Heart Rate (bpm): 144 Fundal Height: 35 cm Movement: Present  Presentation: Complete Breech   BS ultrasound: breech  General:  Alert, oriented and cooperative. Patient is in no acute distress.  Skin: Skin is warm and dry. No rash noted.   Cardiovascular: Normal heart rate noted  Respiratory: Normal respiratory effort, no problems with respiration noted  Abdomen: Soft, gravid, appropriate for gestational age. Pain/Pressure: Present     Pelvic:   GBS/aptima collected  Dilation:  Fingertip Effacement (%): 30 Station: -3  Extremities: Normal range of motion.  Edema: None  Mental Status: Normal mood and affect. Normal behavior. Normal judgment and thought content.   Assessment   30 y.o. A1P3790 at [redacted]w[redacted]d by  05/11/2022, by Ultrasound presenting for routine prenatal visit  Plan   pregnancy Problems (from 09/28/21 to present)     Problem Noted Resolved   Previous cesarean delivery affecting pregnancy, antepartum 03/02/2022 by Federico Flake, MD No   History of VBAC 03/02/2022 by Federico Flake, MD No   Overview Signed 03/02/2022  9:45 AM by Federico Flake, MD    2016- LTCS 2021- VBAC      History of postpartum hemorrhage, currently pregnant 03/02/2022 by Federico Flake, MD No   Overview Signed 03/02/2022  9:46 AM by Federico Flake, MD    2021 EBL [id number]-- Cervical laceration repaired      Supervision of high risk pregnancy, antepartum 09/28/2021 by [name], CNM No   Overview Addendum 03/02/2022 10:12 AM by Federico Flake, MD     Nursing Staff Provider  Office Location  [facility] Dating  sono on 1/25 [redacted]w[redacted]d, so EDD is 05/11/2022  Language  English Anatomy US  Normal female  Flu Vaccine   Genetic Screen  NIPS:   TDaP vaccine   03/02/22  Hgb A1C or  GTT Early : Third trimester :   Covid    LAB RESULTS   Rhogam  Given 5/20 Blood Type   A negative  Feeding Plan Breast Antibody  negative  Contraception IUD+ vasectomy Rubella  immune  Circumcision NA RPR   NR  Pediatrician  [facility] HBsAg   negative  Support Person steven HIV  negative  Prenatal Classes  Varicella immune    GBS  (For PCN allergy, check sensitivities)   BTL Consent     VBAC Consent  Pap  08/01/2020- NILM  Hgb Electro      CF      SMA                    Preterm labor symptoms and general obstetric precautions including but not limited to vaginal bleeding, contractions, leaking of fluid and fetal movement were reviewed in detail with  the patient. Please refer to After Visit Summary for other counseling recommendations.   ECV: 04/20/2022 at 11 AM  Return in about 1 week (around 04/20/2022) for rob/nst.  [name], CNM 04/13/2022 8:46 AM

## 2022-04-14 ENCOUNTER — Encounter: Payer: Self-pay | Admitting: Advanced Practice Midwife

## 2022-04-14 LAB — CERVICOVAGINAL ANCILLARY ONLY
Chlamydia: NEGATIVE
Comment: NEGATIVE
Comment: NORMAL
Neisseria Gonorrhea: NEGATIVE

## 2022-04-15 LAB — STREP GP B NAA: Strep Gp B NAA: NEGATIVE

## 2022-04-17 ENCOUNTER — Other Ambulatory Visit: Payer: Self-pay | Admitting: Obstetrics and Gynecology

## 2022-04-20 ENCOUNTER — Ambulatory Visit (INDEPENDENT_AMBULATORY_CARE_PROVIDER_SITE_OTHER): Payer: BC Managed Care – PPO | Admitting: Advanced Practice Midwife

## 2022-04-20 ENCOUNTER — Encounter: Payer: Self-pay | Admitting: Advanced Practice Midwife

## 2022-04-20 ENCOUNTER — Other Ambulatory Visit: Payer: Self-pay | Admitting: Obstetrics and Gynecology

## 2022-04-20 ENCOUNTER — Observation Stay
Admission: RE | Admit: 2022-04-20 | Discharge: 2022-04-20 | Disposition: A | Discharge status: Home / Self Care | Payer: BC Managed Care – PPO | Attending: Obstetrics and Gynecology | Admitting: Obstetrics and Gynecology

## 2022-04-20 VITALS — BP 126/80 | Wt 280.0 lb

## 2022-04-20 DIAGNOSIS — O09523 Supervision of elderly multigravida, third trimester: Secondary | ICD-10-CM | POA: Diagnosis not present

## 2022-04-20 DIAGNOSIS — Z01818 Encounter for other preprocedural examination: Secondary | ICD-10-CM

## 2022-04-20 DIAGNOSIS — O321XX0 Maternal care for breech presentation, not applicable or unspecified: Principal | ICD-10-CM

## 2022-04-20 DIAGNOSIS — O09299 Supervision of pregnancy with other poor reproductive or obstetric history, unspecified trimester: Secondary | ICD-10-CM

## 2022-04-20 DIAGNOSIS — O34219 Maternal care for unspecified type scar from previous cesarean delivery: Secondary | ICD-10-CM | POA: Diagnosis not present

## 2022-04-20 DIAGNOSIS — O360130 Maternal care for anti-D [Rh] antibodies, third trimester, not applicable or unspecified: Secondary | ICD-10-CM

## 2022-04-20 DIAGNOSIS — Z3A37 37 weeks gestation of pregnancy: Secondary | ICD-10-CM

## 2022-04-20 DIAGNOSIS — E669 Obesity, unspecified: Secondary | ICD-10-CM | POA: Diagnosis not present

## 2022-04-20 DIAGNOSIS — O99210 Obesity complicating pregnancy, unspecified trimester: Secondary | ICD-10-CM

## 2022-04-20 DIAGNOSIS — O99213 Obesity complicating pregnancy, third trimester: Secondary | ICD-10-CM | POA: Insufficient documentation

## 2022-04-20 DIAGNOSIS — Z6791 Unspecified blood type, Rh negative: Secondary | ICD-10-CM

## 2022-04-20 DIAGNOSIS — O0993 Supervision of high risk pregnancy, unspecified, third trimester: Secondary | ICD-10-CM

## 2022-04-20 DIAGNOSIS — Z98891 History of uterine scar from previous surgery: Secondary | ICD-10-CM

## 2022-04-20 LAB — CBC
HCT: 40.6 % (ref 36.0–46.0)
Hemoglobin: 14.1 g/dL (ref 12.0–15.0)
MCH: 30.8 pg (ref 26.0–34.0)
MCHC: 34.7 g/dL (ref 30.0–36.0)
MCV: 88.6 fL (ref 80.0–100.0)
Platelets: 250 10*3/uL (ref 150–400)
RBC: 4.58 MIL/uL (ref 3.87–5.11)
RDW: 13.1 % (ref 11.5–15.5)
WBC: 10.9 10*3/uL — ABNORMAL HIGH (ref 4.0–10.5)
nRBC: 0 % (ref 0.0–0.2)

## 2022-04-20 LAB — TYPE AND SCREEN
ABO/RH(D): A NEG
Antibody Screen: POSITIVE

## 2022-04-20 MED ORDER — FENTANYL CITRATE (PF) 100 MCG/2ML IJ SOLN
50.0000 ug | Freq: Once | INTRAMUSCULAR | Status: AC
  Administered 2022-04-20: 50 ug via INTRAVENOUS
  Filled 2022-04-20: qty 2

## 2022-04-20 MED ORDER — MINERAL OIL PO OIL
TOPICAL_OIL | Freq: Once | ORAL | Status: DC
  Filled 2022-04-20: qty 30

## 2022-04-20 MED ORDER — TERBUTALINE SULFATE 1 MG/ML IJ SOLN
0.2500 mg | Freq: Once | INTRAMUSCULAR | Status: AC
Start: 2022-04-20 — End: 2022-04-20
  Administered 2022-04-20: 0.25 mg via SUBCUTANEOUS
  Filled 2022-04-20: qty 1

## 2022-04-20 MED ORDER — LACTATED RINGERS IV BOLUS
1000.0000 mL | Freq: Once | INTRAVENOUS | Status: AC
  Administered 2022-04-20: 1000 mL via INTRAVENOUS

## 2022-04-20 MED ORDER — RHO D IMMUNE GLOBULIN 1500 UNIT/2ML IJ SOSY
300.0000 ug | PREFILLED_SYRINGE | Freq: Once | INTRAMUSCULAR | Status: DC
  Filled 2022-04-20: qty 2

## 2022-04-20 NOTE — Progress Notes (Signed)
Dr. Valentino Saxon and Dr. Logan Bores at bedside to perform version

## 2022-04-20 NOTE — Progress Notes (Signed)
Routine Prenatal Care Visit  Subjective  Emily Payne is a 31 y.o. [phone] at [redacted]w[redacted]d being seen today for ongoing prenatal care.  She is currently monitored for the following issues for this high-risk pregnancy and has Obesity affecting pregnancy; Dandruff; Depression; Generalized anxiety disorder with panic attacks; S/P laparoscopic surgery; BMI 37.0-37.9, adult; Supervision of high risk pregnancy, antepartum; Vaginal bleeding during pregnancy; Previous cesarean delivery affecting pregnancy, antepartum; History of VBAC; and History of postpartum hemorrhage, currently pregnant on their problem list.  ----------------------------------------------------------------------------------- Patient reports no complaints. She is going to [facility] for external version at noon today.  Contractions: Not present. Vag. Bleeding: None.  Movement: Present. Leaking Fluid denies.  ----------------------------------------------------------------------------------- The following portions of the patient's history were reviewed and updated as appropriate: allergies, current medications, past family history, past medical history, past social history, past surgical history and problem list. Problem list updated.  Objective  Blood pressure 126/80, weight 280 lb (127 kg), last menstrual period 07/28/2021, currently breastfeeding. Pregravid weight 262 lb (118.8 kg) Total Weight Gain 18 lb (8.165 kg) Urinalysis: Urine Protein    Urine Glucose    Fetal Status: Fetal Heart Rate (bpm): 142 Fundal Height: 37 cm Movement: Present     General:  Alert, oriented and cooperative. Patient is in no acute distress.  Skin: Skin is warm and dry. No rash noted.   Cardiovascular: Normal heart rate noted  Respiratory: Normal respiratory effort, no problems with respiration noted  Abdomen: Soft, gravid, appropriate for gestational age. Pain/Pressure: Absent     Pelvic:  Cervical exam deferred        Extremities: Normal range of motion.   Edema: None  Mental Status: Normal mood and affect. Normal behavior. Normal judgment and thought content.   Assessment   30 y.o. O1B5102 at [redacted]w[redacted]d by  05/11/2022, by Ultrasound presenting for routine prenatal visit  Plan   pregnancy Problems (from 09/28/21 to present)    Problem Noted Resolved   Previous cesarean delivery affecting pregnancy, antepartum 03/02/2022 by Federico Flake, MD No   History of VBAC 03/02/2022 by Federico Flake, MD No   Overview Signed 03/02/2022  9:45 AM by Federico Flake, MD    2016- LTCS 2021- VBAC      History of postpartum hemorrhage, currently pregnant 03/02/2022 by Federico Flake, MD No   Overview Signed 03/02/2022  9:46 AM by Federico Flake, MD    2021 EBL [id number]-- Cervical laceration repaired      Supervision of high risk pregnancy, antepartum 09/28/2021 by [name], CNM No   Overview Addendum 03/02/2022 10:12 AM by Federico Flake, MD     Nursing Staff Provider  Office Location  [facility] Dating  sono on 1/25 [redacted]w[redacted]d, so EDD is 05/11/2022  Language  English Anatomy US  Normal female  Flu Vaccine   Genetic Screen  NIPS:   TDaP vaccine   03/02/22  Hgb A1C or  GTT Early : Third trimester :   Covid    LAB RESULTS   Rhogam  Given 5/20 Blood Type   A negative  Feeding Plan Breast Antibody  negative  Contraception IUD+ vasectomy Rubella  immune  Circumcision NA RPR   NR  Pediatrician  [facility] HBsAg   negative  Support Person steven HIV  negative  Prenatal Classes  Varicella immune    GBS  (For PCN allergy, check sensitivities)   BTL Consent     VBAC Consent  Pap  08/01/2020- NILM    Hgb Electro  CF      SMA                   Term labor symptoms and general obstetric precautions including but not limited to vaginal bleeding, contractions, leaking of fluid and fetal movement were reviewed in detail with the patient. Please refer to After Visit Summary for other counseling recommendations.    Return in about 1 week (around 04/27/2022) for rob.  [name], CNM 04/20/2022 10:11 AM

## 2022-04-20 NOTE — Procedures (Addendum)
EXTERNAL CEPHALIC VERSION Procedure Note   Emily Payne female 31 y.o. 04/20/2022    Indications: The patient is a 31 y.o. [id number] female at [redacted]w[redacted]d, 05/11/2022, by Ultrasound with fetal malpresentation, complete breech. The patient has been previously counseled on risks of the procedure including uterine contractions with progression to labor, fetal distress, rupture of membranes, failure of procedure requiring Cesarean delivery, vaginal bleeding, abruption, and fetal death. Consent form for procedure has been signed.   Pre-operative Diagnosis: A [redacted]w[redacted]d week intrauterine pregnancy in breech presentation, obesity in pregnancy, history of prior C-section x 1 with subsequent VBAC.  Rh negative.    Post-operative Diagnosis: Same, s/p unsuccessful version.    Surgeon: Hildred Laser, MD   Assistants: Brennan Bailey, MD. An experienced assistant was required for fetal manipulation, and retraction.    Anesthesia: None   Procedure Details:  The patient was brought to Labor and Delivery where a reactive fetal heart tracing was obtained. The patient was not noted to have any uterine contractions.. She was given 1 dose of subcutaneous terbutaline  for uterine relaxation and a 1L IVF bolus. A bedside ultrasound was performed which revealed single intrauterine pregnancy and breech presentation. There was noted to be adequate fluid.  Copious mineral oil was used for abdominal lubrication. Using manual pressure, the breech was manipulated in a forward roll fashion and then in a backwards roll fashion.  Several attempts were made in each direction, however unsuccessful.  Fetal heart tones were checked intermittently during the procedure and were noted to be reassuring. Following procedure, the patient was placed back on continuous external fetal monitoring. She was noted to have a reassuring and reactive tracing for 1 hour following the external cephalic version. She did not have regular contractions and therefore she  was felt to be stable for discharge to home. She was scheduled for a C-section on 05/03/2022.  Given instructions on Moxibustion, chiropractors, and Spinning Babies maneuvers until then.    Patient is Rh negative, did receive Rhogam on 02/06/2022 for an episode of vaginal bleeding. Not required for today's procedure as she is within 12 weeks of previously receiving. Currently not experiencing any bleeding.     Complications: None     FOLEY BULB PROCEDURE NOTE:    I have verified that the patient is an appropriate candidate for outpatient foley bulb placement.  She has consented to foley bulb placement today. She has no concerns at this time. A pre-procedure NST performed today was reviewed and was found to be reactive.       NONSTRESS TEST INTERPRETATION  INDICATIONS: External Cephalic Version  FHR baseline: 130 bpm (pre-procedure) and 145  bpm (post procedure) RESULTS:Reactive x 2.  COMMENTS: None   PLAN: 1. To arrive at Labor and Delivery for scheduled C-section on 05/03/2022  She was given post-procedure instructions.       Hildred Laser, MD [facility]

## 2022-04-20 NOTE — Discharge Summary (Signed)
RN reviewed discharge instructions with patient. Gave pt opportunity for questions. All questions answered at this time. Pt verbalized understanding. Pt discharged home with significant other.  

## 2022-04-20 NOTE — Progress Notes (Signed)
No vb. No lof.  

## 2022-04-21 ENCOUNTER — Telehealth: Payer: Self-pay | Admitting: Obstetrics and Gynecology

## 2022-04-21 LAB — RPR: RPR Ser Ql: NONREACTIVE

## 2022-04-21 NOTE — Telephone Encounter (Signed)
Called patient to advise of scheduled cesarean section with Dr Valentino Saxon  DOS 05/04/22 @ noon  H&P n/a   Pre-admit phone call appointment 04/30/22 @ 8am-1pm. Explained that this appointment has a call window. Based on the time scheduled will indicate if the call will be received within a 4 hour window before 1:00 or after.  Advised that pt may also receive calls from the hospital pharmacy and pre-service center.  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   Dr Logan Bores will assist in the OR.  1 week incision chk appt with [name] at [facility] on 05/11/22 at 8:15am.

## 2022-04-21 NOTE — Telephone Encounter (Signed)
-----   Message from Hildred Laser, MD sent at 04/20/2022  1:32 PM EDT ----- Regarding: schedule surgery  Date: 05/04/2022 (second choice is 8/16)  Time:    noon                 Est Surgery Time: 1 hour  Inpatient: Yes  Outpatient (23 Hr Obs.):  No  Same Day Surgery: No  Procedure:  Repeat C-section  Dx: Breech presentation   Laterality: N/A  Anesthesia: Spinal   Special Needs: None  Assistant needed: Dr. Logan Bores (or midwife on call).

## 2022-04-24 NOTE — Discharge Summary (Signed)
Obstetric Discharge Summary Reason for Admission:  Procedure - External Cephalic Version Prenatal Procedures: ultrasound noting breech fetal presentation Complications: none   Hemoglobin  Date Value Ref Range Status  04/20/2022 14.1 12.0 - 15.0 g/dL Final  52/84/1324 40.1 11.1 - 15.9 g/dL Final   HCT  Date Value Ref Range Status  04/20/2022 40.6 36.0 - 46.0 % Final   Hematocrit  Date Value Ref Range Status  03/02/2022 39.2 34.0 - 46.6 % Final    Physical Exam:  BP 126/80 General: alert and no distress Uterine Fundus: gravid DVT Evaluation: No evidence of DVT seen on physical exam.  Fetus A Non-Stress Test Interpretation for 04/20/22  Indication:  Post-procedure  Fetal Heart Rate A Mode: External Baseline Rate (A): 140 bpm Variability: Moderate Accelerations: 15 x 15 Decelerations: None  Uterine Activity Mode: Toco Contraction Frequency (min): none w/ UI Contraction Duration (sec): 80 Contraction Quality: Mild Resting Tone Palpated: Relaxed Resting Time: Adequate     Hospital Course: The patient was admitted for planned procedure of external cephalic version due to breech presentation at 37.[redacted] weeks gestation.  Procedure was uncomplicated, however unsucessful. Patient was monitored with reassuring fetal tracing. Allowed to discharge home. Is scheduled for planned C-section on 05/03/2022.    Discharge Diagnoses:  Term pregancy, breech.  Obesity in pregnancy. History of VBAC.   Discharge Information: Date: 04/20/2022 Activity: unrestricted Diet: routine Medications:  PNV Condition: stable Discharge to: home    [facility] 04/24/2022, 5:51 PM

## 2022-04-28 ENCOUNTER — Ambulatory Visit (INDEPENDENT_AMBULATORY_CARE_PROVIDER_SITE_OTHER): Payer: BC Managed Care – PPO | Admitting: Licensed Practical Nurse

## 2022-04-28 VITALS — BP 110/80 | Wt 281.0 lb

## 2022-04-28 DIAGNOSIS — Z3A38 38 weeks gestation of pregnancy: Secondary | ICD-10-CM

## 2022-04-28 DIAGNOSIS — O0990 Supervision of high risk pregnancy, unspecified, unspecified trimester: Secondary | ICD-10-CM

## 2022-04-28 NOTE — Progress Notes (Signed)
Routine Prenatal Care Visit  Subjective  Emily Payne is a 31 y.o. [phone] at [redacted]w[redacted]d being seen today for ongoing prenatal care.  She is currently monitored for the following issues for this high-risk pregnancy and has Obesity affecting pregnancy; Rh negative state in antepartum period; Depression; Generalized anxiety disorder with panic attacks; S/P laparoscopic surgery; BMI 37.0-37.9, adult; Supervision of high risk pregnancy, antepartum; Previous cesarean delivery affecting pregnancy, antepartum; History of VBAC; History of postpartum hemorrhage, currently pregnant; and Breech presentation of fetus on their problem list.  ----------------------------------------------------------------------------------- Patient reports  occasional B-H contractions .  Bruising noted along lower abdomen, Emily Payne thinks it may have been caused by the version, Denies seeing bleeding.   -ready to meet baby, Contractions: Irregular. Vag. Bleeding: None.  Movement: Present. Leaking Fluid denies.  ----------------------------------------------------------------------------------- The following portions of the patient's history were reviewed and updated as appropriate: allergies, current medications, past family history, past medical history, past social history, past surgical history and problem list. Problem list updated.  Objective  Blood pressure 110/80, weight 281 lb (127.5 kg), last menstrual period 07/28/2021, currently breastfeeding. Pregravid weight 262 lb (118.8 kg) Total Weight Gain 19 lb (8.618 kg) Urinalysis: Urine Protein    Urine Glucose    Fetal Status: Fetal Heart Rate (bpm): 145 Fundal Height: 38 cm Movement: Present  Presentation: Complete Breech BSUS breech  General:  Alert, oriented and cooperative. Patient is in no acute distress.  Skin: Skin is warm and dry. No rash noted.   Cardiovascular: Normal heart rate noted  Respiratory: Normal respiratory effort, no problems with respiration noted   Abdomen: Soft, gravid, appropriate for gestational age. Pain/Pressure: Present     Pelvic:  Cervical exam deferred        Extremities: Normal range of motion.  Edema: None  Mental Status: Normal mood and affect. Normal behavior. Normal judgment and thought content.   Assessment   31 y.o. [id number] at [redacted]w[redacted]d by  05/11/2022, by Ultrasound presenting for routine prenatal visit  Plan   pregnancy Problems (from 09/28/21 to present)     Problem Noted Resolved   Previous cesarean delivery affecting pregnancy, antepartum 03/02/2022 by Federico Flake, MD No   History of VBAC 03/02/2022 by Federico Flake, MD No   Overview Signed 03/02/2022  9:45 AM by Federico Flake, MD    2016- LTCS 2021- VBAC      History of postpartum hemorrhage, currently pregnant 03/02/2022 by Federico Flake, MD No   Overview Signed 03/02/2022  9:46 AM by Federico Flake, MD    2021 EBL [id number]-- Cervical laceration repaired      Supervision of high risk pregnancy, antepartum 09/28/2021 by [name], CNM No   Overview Addendum 03/02/2022 10:12 AM by Federico Flake, MD     Nursing Staff Provider  Office Location  [facility] Dating  sono on 1/25 [redacted]w[redacted]d, so EDD is 05/11/2022  Language  English Anatomy US  Normal female  Flu Vaccine   Genetic Screen  NIPS:   TDaP vaccine   03/02/22  Hgb A1C or  GTT Early : Third trimester :   Covid    LAB RESULTS   Rhogam  Given 5/20 Blood Type   A negative  Feeding Plan Breast Antibody  negative  Contraception IUD+ vasectomy Rubella  immune  Circumcision NA RPR   NR  Pediatrician  [facility] HBsAg   negative  Support Person steven HIV  negative  Prenatal Classes  Varicella immune    GBS  (For  PCN allergy, check sensitivities)   BTL Consent     VBAC Consent  Pap  08/01/2020- NILM    Hgb Electro      CF      SMA                    Term labor symptoms and general obstetric precautions including but not limited to vaginal bleeding,  contractions, leaking of fluid and fetal movement were reviewed in detail with the patient. Please refer to After Visit Summary for other counseling recommendations.   C/S 8/15  Carie Caddy, CNM  [place], [facility]  04/28/22  10:22 AM

## 2022-04-28 NOTE — Progress Notes (Signed)
ROB- cervix check, no concerns 

## 2022-04-30 ENCOUNTER — Encounter
Admission: RE | Admit: 2022-04-30 | Discharge: 2022-04-30 | Disposition: A | Discharge status: Home / Self Care | Payer: BC Managed Care – PPO | Source: Ambulatory Visit | Attending: Obstetrics and Gynecology | Admitting: Obstetrics and Gynecology

## 2022-04-30 NOTE — Patient Instructions (Addendum)
Your procedure is scheduled on: Tuesday, August 15  Arrival Time: Please call Labor and Delivery the day before your scheduled C-Section to find out your arrival time. [phone].  Arrival: If your arrival time is prior to 6:00 am, please enter through the Emergency Room Entrance and you will be directed to Labor and Delivery. If your arrival time is 6:00 am or later, please enter the Medical Mall and follow the greeter's instructions.  REMEMBER: Instructions that are not followed completely may result in serious medical risk, up to and including death; or upon the discretion of your surgeon and anesthesiologist your surgery may need to be rescheduled.  Do not eat food after midnight the night before surgery.  No gum chewing, lozengers or hard candies.  You may however, drink CLEAR liquids up to 2 hours before you are scheduled to arrive for your surgery. Do not drink anything within 2 hours of your scheduled arrival time.  Clear liquids include: - water  - apple juice without pulp - gatorade (not RED colors) - black coffee or tea (Do NOT add milk or creamers to the coffee or tea) Do NOT drink anything that is not on this list.  In addition, your doctor has ordered for you to drink the provided  Ensure Pre-Surgery Clear Carbohydrate Drink  Drinking this carbohydrate drink up to two hours before surgery helps to reduce insulin resistance and improve patient outcomes. Please complete drinking 2 hours prior to scheduled arrival time.  TAKE THESE MEDICATIONS THE MORNING OF SURGERY WITH A SIP OF WATER:  Escitalopram (Lexapro) Famotidine (Pepcid) - (take one the night before and one on the morning of surgery - helps to prevent nausea after surgery.)  Use albuterol inhaler on the day of surgery and bring to the hospital.  One week prior to surgery: starting today, August 11 Stop Anti-inflammatories (NSAIDS) such as Advil, Aleve, Ibuprofen, Motrin, Naproxen, Naprosyn and Aspirin based  products such as Excedrin, Goodys Powder, BC Powder. Stop ANY OVER THE COUNTER supplements until after surgery. You may however, continue to take Tylenol if needed for pain up until the day of surgery.  No Alcohol for 24 hours before or after surgery.  No Smoking including e-cigarettes for 24 hours prior to surgery.  No chewable tobacco products for at least 6 hours prior to surgery.  No nicotine patches on the day of surgery.  On the morning of surgery brush your teeth with toothpaste and water, you may rinse your mouth with mouthwash if you wish. Do not swallow any toothpaste or mouthwash.  Use CHG wipes as directed on instruction sheet.  Do not wear jewelry, make-up, hairpins, clips or nail polish.  Do not wear lotions, powders, or perfumes.   Do not shave body from the neck down 48 hours prior to surgery just in case you cut yourself which could leave a site for infection.  Also, freshly shaved skin may become irritated if using the CHG soap.  Contact lenses, hearing aids and dentures may not be worn into surgery.  Do not bring valuables to the hospital. [facility] is not responsible for any missing/lost belongings or valuables.   Notify your doctor if there is any change in your medical condition (cold, fever, infection).  Wear comfortable clothing (specific to your surgery type) to the hospital.  After surgery, you can help prevent lung complications by doing breathing exercises.  Take deep breaths and cough every 1-2 hours. Your doctor may order a device called an Facilities manager to  help you take deep breaths. When coughing or sneezing, hold a pillow firmly against your incision with both hands. This is called "splinting." Doing this helps protect your incision. It also decreases belly discomfort.  Please call the Pre-admissions Testing Dept. at [phone] if you have any questions about these instructions.  Support Person  The designated support person is not  considered a Equities trader.  Any inpatient support person will be given a band identifying them as the patient's chosen support person and may stay with the patient around the clock.  Visitor Passes   All visitors, including children, need an identification sticker when visiting. These stickers must be worn where they can be seen.   Labor & Delivery  Laboring women of any age may have one designated support person and one other visitor aged 32 and older at a time, and a doula registered with [place] for the labor and delivery phase of their stay. (Doulas not registered with [facility] are counted as visitors.) The visitor may switch with other visitors. Visitation is allowed 24 hours per day.  No children under the age of 51. The designated support person or a visitor may stay overnight in the room.  Mother Baby Unit, OB Specialty and Gynecological Care  A designated support person and three other visitors of any age may visit. The three visitors may switch out. The designated support person or a visitor aged 37 or older may stay overnight in the room. During the postpartum period (up to 6 weeks), if the mother is the patient, she can have her newborn stay with her if there is another support person present who can be responsible for the baby.   Preparing the Skin Before Surgery     To help prevent the risk of infection at your surgical site, we are now providing you with rinse-free Sage 2% Chlorhexidine Gluconate (CHG) disposable wipes.  The night before surgery: Shower or bathe with warm water. Do not apply perfume, lotions, powders. Wait one hour after shower. Skin should be dry and cool. Open Sage wipe package - 6 disposable cloths are inside. Wipe body using one cloth for the right arm, one cloth for the left arm, one cloth for the right leg, one cloth for the left leg, one cloth for the chest/abdomen area (do not use on breasts if breast feeding), and one cloth for the back. 5.  Do not rinse, allow to dry. 6. Skin may fee "tacky" for several minutes. 7. Dress in clean clothes. 8. Place clean sheets on your bed and do not sleep with pets.  REPEAT ABOVE ON THE MORNING OF SURGERY PRIOR TO ARRIVING TO THE HOSPITAL.

## 2022-05-03 MED ORDER — POVIDONE-IODINE 10 % EX SWAB
2.0000 | Freq: Once | CUTANEOUS | Status: AC
  Administered 2022-05-04: 2 via TOPICAL

## 2022-05-03 MED ORDER — LACTATED RINGERS IV SOLN: Freq: Once | INTRAVENOUS | Status: AC

## 2022-05-03 MED ORDER — ACETAMINOPHEN 500 MG PO TABS
1000.0000 mg | ORAL_TABLET | ORAL | Status: AC
  Administered 2022-05-04: 1000 mg via ORAL
  Filled 2022-05-03: qty 2

## 2022-05-03 MED ORDER — CEFAZOLIN SODIUM-DEXTROSE 2-4 GM/100ML-% IV SOLN
2.0000 g | INTRAVENOUS | Status: AC
  Administered 2022-05-04: 2 g via INTRAVENOUS
  Filled 2022-05-03: qty 100

## 2022-05-03 MED ORDER — SOD CITRATE-CITRIC ACID 500-334 MG/5ML PO SOLN: 30.0000 mL | ORAL | Status: AC

## 2022-05-03 MED ORDER — GABAPENTIN 300 MG PO CAPS
300.0000 mg | ORAL_CAPSULE | ORAL | Status: AC
  Administered 2022-05-04: 300 mg via ORAL
  Filled 2022-05-03: qty 1

## 2022-05-03 MED ORDER — ORAL CARE MOUTH RINSE: 15.0000 mL | Freq: Once | OROMUCOSAL | Status: AC

## 2022-05-03 MED ORDER — CHLORHEXIDINE GLUCONATE 0.12 % MT SOLN
15.0000 mL | Freq: Once | OROMUCOSAL | Status: AC
  Administered 2022-05-04: 15 mL via OROMUCOSAL
  Filled 2022-05-03: qty 15

## 2022-05-03 MED ORDER — LACTATED RINGERS IV SOLN: INTRAVENOUS | Status: DC

## 2022-05-04 ENCOUNTER — Encounter
Admission: RE | Disposition: A | Discharge status: Home / Self Care | Payer: Self-pay | Source: Home / Self Care | Attending: Obstetrics and Gynecology

## 2022-05-04 ENCOUNTER — Encounter: Payer: Self-pay | Admitting: Obstetrics and Gynecology

## 2022-05-04 ENCOUNTER — Other Ambulatory Visit: Payer: Self-pay

## 2022-05-04 ENCOUNTER — Inpatient Hospital Stay: Payer: BC Managed Care – PPO | Admitting: Certified Registered Nurse Anesthetist

## 2022-05-04 ENCOUNTER — Inpatient Hospital Stay
Admission: RE | Admit: 2022-05-04 | Discharge: 2022-05-05 | DRG: 788 | Disposition: A | Discharge status: Home / Self Care | Payer: BC Managed Care – PPO | Attending: Obstetrics and Gynecology | Admitting: Obstetrics and Gynecology

## 2022-05-04 DIAGNOSIS — Z6791 Unspecified blood type, Rh negative: Secondary | ICD-10-CM | POA: Diagnosis not present

## 2022-05-04 DIAGNOSIS — O26893 Other specified pregnancy related conditions, third trimester: Secondary | ICD-10-CM | POA: Diagnosis not present

## 2022-05-04 DIAGNOSIS — O99214 Obesity complicating childbirth: Secondary | ICD-10-CM | POA: Diagnosis not present

## 2022-05-04 DIAGNOSIS — O09299 Supervision of pregnancy with other poor reproductive or obstetric history, unspecified trimester: Secondary | ICD-10-CM

## 2022-05-04 DIAGNOSIS — Z3A39 39 weeks gestation of pregnancy: Secondary | ICD-10-CM

## 2022-05-04 DIAGNOSIS — O34211 Maternal care for low transverse scar from previous cesarean delivery: Principal | ICD-10-CM | POA: Diagnosis present

## 2022-05-04 DIAGNOSIS — F411 Generalized anxiety disorder: Secondary | ICD-10-CM

## 2022-05-04 DIAGNOSIS — O34219 Maternal care for unspecified type scar from previous cesarean delivery: Secondary | ICD-10-CM | POA: Diagnosis not present

## 2022-05-04 DIAGNOSIS — Z01818 Encounter for other preprocedural examination: Secondary | ICD-10-CM

## 2022-05-04 DIAGNOSIS — O321XX0 Maternal care for breech presentation, not applicable or unspecified: Secondary | ICD-10-CM | POA: Diagnosis present

## 2022-05-04 DIAGNOSIS — E669 Obesity, unspecified: Secondary | ICD-10-CM

## 2022-05-04 DIAGNOSIS — O0990 Supervision of high risk pregnancy, unspecified, unspecified trimester: Secondary | ICD-10-CM

## 2022-05-04 DIAGNOSIS — Z98891 History of uterine scar from previous surgery: Secondary | ICD-10-CM

## 2022-05-04 DIAGNOSIS — O26899 Other specified pregnancy related conditions, unspecified trimester: Secondary | ICD-10-CM

## 2022-05-04 LAB — CBC
HCT: 41.4 % (ref 36.0–46.0)
Hemoglobin: 14.2 g/dL (ref 12.0–15.0)
MCH: 30.5 pg (ref 26.0–34.0)
MCHC: 34.3 g/dL (ref 30.0–36.0)
MCV: 88.8 fL (ref 80.0–100.0)
Platelets: 264 10*3/uL (ref 150–400)
RBC: 4.66 MIL/uL (ref 3.87–5.11)
RDW: 13 % (ref 11.5–15.5)
WBC: 10.9 10*3/uL — ABNORMAL HIGH (ref 4.0–10.5)
nRBC: 0 % (ref 0.0–0.2)

## 2022-05-04 SURGERY — Surgical Case
Anesthesia: Spinal

## 2022-05-04 MED ORDER — BUPIVACAINE IN DEXTROSE 0.75-8.25 % IT SOLN
INTRATHECAL | Status: DC | PRN
  Administered 2022-05-04: 1.6 mL via INTRATHECAL

## 2022-05-04 MED ORDER — NALOXONE HCL 4 MG/10ML IJ SOLN: 1.0000 ug/kg/h | INTRAVENOUS | Status: DC | PRN

## 2022-05-04 MED ORDER — MORPHINE SULFATE (PF) 0.5 MG/ML IJ SOLN
INTRAMUSCULAR | Status: DC | PRN
  Administered 2022-05-04: 100 ug via INTRATHECAL

## 2022-05-04 MED ORDER — LACTATED RINGERS IV SOLN: INTRAVENOUS | Status: DC

## 2022-05-04 MED ORDER — ACETAMINOPHEN 500 MG PO TABS: 1000.0000 mg | ORAL_TABLET | Freq: Four times a day (QID) | ORAL | Status: DC

## 2022-05-04 MED ORDER — HYDROMORPHONE HCL 1 MG/ML IJ SOLN: 1.0000 mg | INTRAMUSCULAR | Status: DC | PRN

## 2022-05-04 MED ORDER — MAGNESIUM HYDROXIDE 400 MG/5ML PO SUSP: 30.0000 mL | ORAL | Status: DC | PRN

## 2022-05-04 MED ORDER — DIPHENHYDRAMINE HCL 25 MG PO CAPS: 25.0000 mg | ORAL_CAPSULE | Freq: Four times a day (QID) | ORAL | Status: DC | PRN

## 2022-05-04 MED ORDER — WITCH HAZEL-GLYCERIN EX PADS
1.0000 | MEDICATED_PAD | CUTANEOUS | Status: DC | PRN
Start: 2022-05-04 — End: 2022-05-05

## 2022-05-04 MED ORDER — PRENATAL MULTIVITAMIN CH: 1.0000 | ORAL_TABLET | Freq: Every day | ORAL | Status: DC

## 2022-05-04 MED ORDER — FENTANYL CITRATE (PF) 100 MCG/2ML IJ SOLN
INTRAMUSCULAR | Status: DC | PRN
  Administered 2022-05-04: 15 ug via INTRATHECAL

## 2022-05-04 MED ORDER — ACETAMINOPHEN 500 MG PO TABS
1000.0000 mg | ORAL_TABLET | Freq: Four times a day (QID) | ORAL | Status: DC
  Administered 2022-05-04 – 2022-05-05 (×3): 1000 mg via ORAL
  Filled 2022-05-04 (×3): qty 2

## 2022-05-04 MED ORDER — MORPHINE SULFATE (PF) 0.5 MG/ML IJ SOLN
INTRAMUSCULAR | Status: AC
  Filled 2022-05-04: qty 10

## 2022-05-04 MED ORDER — MEPERIDINE HCL 25 MG/ML IJ SOLN: 6.2500 mg | INTRAMUSCULAR | Status: DC | PRN

## 2022-05-04 MED ORDER — OXYTOCIN-SODIUM CHLORIDE 30-0.9 UT/500ML-% IV SOLN: 2.5000 [IU]/h | INTRAVENOUS | Status: DC

## 2022-05-04 MED ORDER — KETOROLAC TROMETHAMINE 30 MG/ML IJ SOLN
30.0000 mg | Freq: Four times a day (QID) | INTRAMUSCULAR | Status: DC
  Administered 2022-05-04 – 2022-05-05 (×3): 30 mg via INTRAVENOUS
  Filled 2022-05-04 (×3): qty 1

## 2022-05-04 MED ORDER — FENTANYL CITRATE (PF) 100 MCG/2ML IJ SOLN
INTRAMUSCULAR | Status: AC
  Filled 2022-05-04: qty 2

## 2022-05-04 MED ORDER — IBUPROFEN 600 MG PO TABS: 600.0000 mg | ORAL_TABLET | Freq: Four times a day (QID) | ORAL | Status: DC

## 2022-05-04 MED ORDER — ONDANSETRON HCL 4 MG/2ML IJ SOLN: 4.0000 mg | Freq: Three times a day (TID) | INTRAMUSCULAR | Status: DC | PRN

## 2022-05-04 MED ORDER — ESCITALOPRAM OXALATE 10 MG PO TABS
20.0000 mg | ORAL_TABLET | Freq: Every day | ORAL | Status: DC
  Administered 2022-05-04: 20 mg via ORAL
  Filled 2022-05-04 (×2): qty 2

## 2022-05-04 MED ORDER — EPHEDRINE SULFATE (PRESSORS) 50 MG/ML IJ SOLN
INTRAMUSCULAR | Status: DC | PRN
  Administered 2022-05-04 (×5): 5 mg via INTRAVENOUS

## 2022-05-04 MED ORDER — SIMETHICONE 80 MG PO CHEW: 80.0000 mg | CHEWABLE_TABLET | ORAL | Status: DC | PRN

## 2022-05-04 MED ORDER — ONDANSETRON HCL 4 MG/2ML IJ SOLN
INTRAMUSCULAR | Status: DC | PRN
  Administered 2022-05-04: 4 mg via INTRAVENOUS

## 2022-05-04 MED ORDER — GABAPENTIN 300 MG PO CAPS
300.0000 mg | ORAL_CAPSULE | Freq: Two times a day (BID) | ORAL | Status: DC
  Administered 2022-05-05: 300 mg via ORAL
  Filled 2022-05-04: qty 1

## 2022-05-04 MED ORDER — KETOROLAC TROMETHAMINE 30 MG/ML IJ SOLN
INTRAMUSCULAR | Status: DC | PRN
  Administered 2022-05-04: 30 mg via INTRAVENOUS

## 2022-05-04 MED ORDER — SOD CITRATE-CITRIC ACID 500-334 MG/5ML PO SOLN
ORAL | Status: AC
  Administered 2022-05-04: 30 mL via ORAL
  Filled 2022-05-04: qty 15

## 2022-05-04 MED ORDER — COCONUT OIL OIL
1.0000 | TOPICAL_OIL | Status: DC | PRN
Start: 2022-05-04 — End: 2022-05-05

## 2022-05-04 MED ORDER — PHENYLEPHRINE HCL-NACL 20-0.9 MG/250ML-% IV SOLN
INTRAVENOUS | Status: AC
  Filled 2022-05-04: qty 250

## 2022-05-04 MED ORDER — PHENYLEPHRINE HCL-NACL 20-0.9 MG/250ML-% IV SOLN
INTRAVENOUS | Status: DC | PRN
  Administered 2022-05-04: 40 ug/min via INTRAVENOUS

## 2022-05-04 MED ORDER — DIPHENHYDRAMINE HCL 25 MG PO CAPS: 25.0000 mg | ORAL_CAPSULE | ORAL | Status: DC | PRN

## 2022-05-04 MED ORDER — KETOROLAC TROMETHAMINE 30 MG/ML IJ SOLN: 30.0000 mg | Freq: Four times a day (QID) | INTRAMUSCULAR | Status: DC | PRN

## 2022-05-04 MED ORDER — BUPIVACAINE-MELOXICAM ER 400-12 MG/14ML IJ SOLN
INTRAMUSCULAR | Status: AC
  Filled 2022-05-04: qty 1

## 2022-05-04 MED ORDER — PHENYLEPHRINE 80 MCG/ML (10ML) SYRINGE FOR IV PUSH (FOR BLOOD PRESSURE SUPPORT)
PREFILLED_SYRINGE | INTRAVENOUS | Status: DC | PRN
  Administered 2022-05-04 (×6): 80 ug via INTRAVENOUS

## 2022-05-04 MED ORDER — NALOXONE HCL 0.4 MG/ML IJ SOLN: 0.4000 mg | INTRAMUSCULAR | Status: DC | PRN

## 2022-05-04 MED ORDER — OXYTOCIN-SODIUM CHLORIDE 30-0.9 UT/500ML-% IV SOLN
INTRAVENOUS | Status: DC | PRN
  Administered 2022-05-04: 500 mL via INTRAVENOUS

## 2022-05-04 MED ORDER — OXYTOCIN-SODIUM CHLORIDE 30-0.9 UT/500ML-% IV SOLN
INTRAVENOUS | Status: AC
  Administered 2022-05-04: 2.5 [IU]/h via INTRAVENOUS
  Filled 2022-05-04: qty 1000

## 2022-05-04 MED ORDER — DIPHENHYDRAMINE HCL 50 MG/ML IJ SOLN: 12.5000 mg | INTRAMUSCULAR | Status: DC | PRN

## 2022-05-04 MED ORDER — LIDOCAINE HCL (PF) 1 % IJ SOLN
INTRAMUSCULAR | Status: DC | PRN
  Administered 2022-05-04: 3 mL

## 2022-05-04 MED ORDER — SENNOSIDES-DOCUSATE SODIUM 8.6-50 MG PO TABS
2.0000 | ORAL_TABLET | Freq: Every day | ORAL | Status: DC
  Administered 2022-05-05: 2 via ORAL
  Filled 2022-05-04: qty 2

## 2022-05-04 MED ORDER — FERROUS SULFATE 325 (65 FE) MG PO TABS
325.0000 mg | ORAL_TABLET | ORAL | Status: DC
  Administered 2022-05-05: 325 mg via ORAL
  Filled 2022-05-04: qty 1

## 2022-05-04 MED ORDER — MENTHOL 3 MG MT LOZG: 1.0000 | LOZENGE | OROMUCOSAL | Status: DC | PRN

## 2022-05-04 MED ORDER — SCOPOLAMINE 1 MG/3DAYS TD PT72: 1.0000 | MEDICATED_PATCH | Freq: Once | TRANSDERMAL | Status: DC

## 2022-05-04 MED ORDER — OXYCODONE HCL 5 MG PO TABS: 5.0000 mg | ORAL_TABLET | ORAL | Status: DC | PRN

## 2022-05-04 MED ORDER — SODIUM CHLORIDE 0.9% FLUSH: 3.0000 mL | INTRAVENOUS | Status: DC | PRN

## 2022-05-04 MED ORDER — DIBUCAINE (PERIANAL) 1 % EX OINT: 1.0000 | TOPICAL_OINTMENT | CUTANEOUS | Status: DC | PRN

## 2022-05-04 MED ORDER — ZOLPIDEM TARTRATE 5 MG PO TABS: 5.0000 mg | ORAL_TABLET | Freq: Every evening | ORAL | Status: DC | PRN

## 2022-05-04 MED ORDER — TRAMADOL HCL 50 MG PO TABS: 100.0000 mg | ORAL_TABLET | Freq: Four times a day (QID) | ORAL | Status: DC | PRN

## 2022-05-04 MED ORDER — BUPIVACAINE-MELOXICAM ER 200-6 MG/7ML IJ SOLN
INTRAMUSCULAR | Status: DC | PRN
  Administered 2022-05-04: 400 mg

## 2022-05-04 SURGICAL SUPPLY — 34 items
APL PRP STRL LF DISP 70% ISPRP (MISCELLANEOUS) ×2
APL SKNCLS STERI-STRIP NONHPOA (GAUZE/BANDAGES/DRESSINGS) ×1
BAG COUNTER SPONGE SURGICOUNT (BAG) ×2 IMPLANT
BAG SPNG CNTER NS LX DISP (BAG) ×1
BENZOIN TINCTURE PRP APPL 2/3 (GAUZE/BANDAGES/DRESSINGS) ×1 IMPLANT
CHLORAPREP W/TINT 26 (MISCELLANEOUS) ×4 IMPLANT
CLOSURE STERI STRIP 1/2 X4 (GAUZE/BANDAGES/DRESSINGS) ×1 IMPLANT
DRESSING TELFA 8X3 (GAUZE/BANDAGES/DRESSINGS) ×1 IMPLANT
DRSG TELFA 3X8 NADH (GAUZE/BANDAGES/DRESSINGS) ×2 IMPLANT
ELECT REM PT RETURN 9FT ADLT (ELECTROSURGICAL) ×2
ELECTRODE REM PT RTRN 9FT ADLT (ELECTROSURGICAL) ×1 IMPLANT
EXTRT SYSTEM ALEXIS 17CM (MISCELLANEOUS)
GAUZE SPONGE 4X4 12PLY STRL (GAUZE/BANDAGES/DRESSINGS) ×2 IMPLANT
GLOVE BIO SURGEON STRL SZ 6.5 (GLOVE) ×2 IMPLANT
GLOVE SURG UNDER LTX SZ7 (GLOVE) ×2 IMPLANT
GOWN STRL REUS W/ TWL LRG LVL3 (GOWN DISPOSABLE) ×2 IMPLANT
GOWN STRL REUS W/TWL LRG LVL3 (GOWN DISPOSABLE) ×4
KIT TURNOVER KIT A (KITS) ×2 IMPLANT
MANIFOLD NEPTUNE II (INSTRUMENTS) ×2 IMPLANT
MAT PREVALON FULL STRYKER (MISCELLANEOUS) ×2 IMPLANT
NS IRRIG 1000ML POUR BTL (IV SOLUTION) ×2 IMPLANT
PACK C SECTION AR (MISCELLANEOUS) ×2 IMPLANT
PAD DRESSING TELFA 3X8 NADH (GAUZE/BANDAGES/DRESSINGS) ×1 IMPLANT
PAD OB MATERNITY 4.3X12.25 (PERSONAL CARE ITEMS) ×2 IMPLANT
PAD PREP 24X41 OB/GYN DISP (PERSONAL CARE ITEMS) ×2 IMPLANT
SCRUB CHG 4% DYNA-HEX 4OZ (MISCELLANEOUS) ×2 IMPLANT
SUT MNCRL AB 4-0 PS2 18 (SUTURE) ×2 IMPLANT
SUT PLAIN 2 0 XLH (SUTURE) IMPLANT
SUT VIC AB 0 CT1 36 (SUTURE) ×8 IMPLANT
SUT VIC AB 3-0 SH 27 (SUTURE) ×2
SUT VIC AB 3-0 SH 27X BRD (SUTURE) ×1 IMPLANT
SYSTEM CONTND EXTRCTN KII BLLN (MISCELLANEOUS) IMPLANT
TRAP FLUID SMOKE EVACUATOR (MISCELLANEOUS) ×2 IMPLANT
WATER STERILE IRR 500ML POUR (IV SOLUTION) ×2 IMPLANT

## 2022-05-04 NOTE — Anesthesia Preprocedure Evaluation (Addendum)
Anesthesia Evaluation  Patient identified by MRN, date of birth, ID band Patient awake    Reviewed: Allergy & Precautions, NPO status , Patient's Chart, lab work & pertinent test results  History of Anesthesia Complications Negative for: history of anesthetic complications  Airway Mallampati: III   Neck ROM: Full    Dental no notable dental hx.    Pulmonary neg pulmonary ROS,    Pulmonary exam normal breath sounds clear to auscultation       Cardiovascular Exercise Tolerance: Good negative cardio ROS Normal cardiovascular exam Rhythm:Regular Rate:Normal     Neuro/Psych  Headaches, PSYCHIATRIC DISORDERS Anxiety Depression Bipolar Disorder    GI/Hepatic negative GI ROS,   Endo/Other  negative endocrine ROSObesity   Renal/GU negative Renal ROS     Musculoskeletal   Abdominal   Peds  Hematology negative hematology ROS (+)   Anesthesia Other Findings 31 yo [id number] presenting for repeat c-section for breech presentation.  Reproductive/Obstetrics                            Anesthesia Physical Anesthesia Plan  ASA: 2  Anesthesia Plan: Spinal   Post-op Pain Management:    Induction:   PONV Risk Score and Plan: 2 and Ondansetron and Treatment may vary due to age or medical condition  Airway Management Planned: Natural Airway and Nasal Cannula  Additional Equipment:   Intra-op Plan:   Post-operative Plan:   Informed Consent: I have reviewed the patients History and Physical, chart, labs and discussed the procedure including the risks, benefits and alternatives for the proposed anesthesia with the patient or authorized representative who has indicated his/her understanding and acceptance.     Dental Advisory Given  Plan Discussed with: Anesthesiologist, CRNA and Surgeon  Anesthesia Plan Comments: (Patient reports no bleeding problems and no anticoagulant use.  Plan for spinal  with backup GA.  Patient consented for risks of anesthesia including but not limited to:  - adverse reactions to medications - damage to eyes, teeth, lips or other oral mucosa - nerve damage due to positioning  - risk of bleeding, infection and or nerve damage from spinal that could lead to paralysis - risk of headache or failed spinal - damage to teeth, lips or other oral mucosa - sore throat or hoarseness - damage to heart, brain, nerves, lungs, other parts of body or loss of life  Patient voiced understanding.)        Anesthesia Quick Evaluation

## 2022-05-04 NOTE — Transfer of Care (Signed)
Immediate Anesthesia Transfer of Care Note  Patient: Emily Payne  Procedure(s) Performed: CESAREAN SECTION  Patient Location: Mother/Baby  Anesthesia Type:Spinal  Level of Consciousness: awake, alert  and oriented  Airway & Oxygen Therapy: Patient Spontanous Breathing  Post-op Assessment: Report given to RN and Post -op Vital signs reviewed and stable  Post vital signs: Reviewed and stable  Last Vitals:  Vitals Value Taken Time  BP 115/72   Temp    Pulse 68   Resp 14   SpO2 98     Last Pain:  Vitals:   05/04/22 1029  TempSrc:   PainSc: 0-No pain         Complications: No notable events documented.

## 2022-05-04 NOTE — H&P (Signed)
Obstetric Preoperative History and Physical  Emily Payne is a 31 y.o. [phone] with IUP at [redacted]w[redacted]d presenting for presenting for scheduled repeat cesarean section for breech presentation. Has h/o prior C-section x 1 with successful VBAC.  No acute concerns.   Prenatal Course Source of Care: [facility]  with onset of care at 8 weeks Pregnancy complications or risks: Patient Active Problem List   Diagnosis Date Noted   Breech presentation of fetus 04/20/2022   Previous cesarean delivery affecting pregnancy, antepartum 03/02/2022   History of VBAC 03/02/2022   History of postpartum hemorrhage, currently pregnant 03/02/2022   Supervision of high risk pregnancy, antepartum 09/28/2021   BMI 37.0-37.9, adult 01/23/2020   S/P laparoscopic surgery 06/28/2019   Generalized anxiety disorder with panic attacks 08/31/2018   Depression 02/21/2015   Rh negative state in antepartum period 03/09/2014   Obesity affecting pregnancy 07/04/2012   She plans to breastfeed She desires vasectomy, IUD for postpartum contraception.   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 with aura    Poor fetal growth affecting management of mother in third trimester    Single liveborn infant delivered vaginally 06/09/2020   Supervision of high risk pregnancy in first trimester 11/14/2019    Nursing Staff Provider  Office Location  [facility] Dating   LMP=7wk [place]  Language  English Anatomy US   Incomplete, suboptimal scan with MFM, close exam after birth advised  Flu Vaccine   Genetic Screen  NIPS:normal XX  TDaP vaccine   given 7/16 Hgb A1C or  GTT Early :102 Third trimester :   Rhogam  03/07/20   LAB RESULTS   Feeding Plan  breast Blood Type A/Negative/-- (02/24 1026)   Contraception  A   VBAC, delivered, current hospitalization 06/09/2020    Past Surgical History:  Procedure Laterality Date   abscess tooth     CESAREAN SECTION N/A 10/01/2014   Procedure: PRIMARY CESAREAN SECTION;  Surgeon: Adam Phenix, MD;  Location: [facility];  Service: Obstetrics;  Laterality: N/A;   DIAGNOSTIC LAPAROSCOPY WITH REMOVAL OF ECTOPIC PREGNANCY N/A 06/27/2019   Procedure: DIAGNOSTIC LAPAROSCOPY , evacuation or hemoperitoneum;  Surgeon: Natale Milch, MD;  Location: [facility] ORS;  Service: Gynecology;  Laterality: N/A;   LAPAROSCOPIC UNILATERAL SALPINGECTOMY Right 06/27/2019   Procedure: LAPAROSCOPIC UNILATERAL SALPINGECTOMY;  Surgeon: Natale Milch, MD;  Location: [facility] ORS;  Service: Gynecology;  Laterality: Right;   TONSILLECTOMY     WISDOM TOOTH EXTRACTION      OB History  Gravida Para Term Preterm AB Living  4 2 2  0 1 2  SAB IAB Ectopic Multiple Live Births  0 0 1 0 2    # Outcome Date GA Lbr Len/2nd Weight Sex Delivery Anes PTL Lv  4 Current           3 Term 06/09/20 [redacted]w[redacted]d 15:21 / 00:49 3810 g F Vag-Spont EPI  LIV  2  Term 10/01/14 [redacted]w[redacted]d  3100 g M CS-LTranv Spinal  LIV  1 Ectopic             Social History   Socioeconomic History   Marital status: Married    Spouse name: Viviann Spare   Number of children: 4   Years of education: 17   Highest education level: Bachelor's degree (e.g., BA, AB, BS)  Occupational History   Occupation: Self Employed  Tobacco Use   Smoking status: Never   Smokeless tobacco: Never  Vaping Use   Vaping Use: Never used  Substance and Sexual Activity   Alcohol use: Not Currently    Alcohol/week: 0.0 standard drinks of alcohol    Comment: rare but  none with pregnancy   Drug use: No   Sexual activity: Yes    Partners: Male    Birth control/protection: None  Other Topics Concern   Not on file  Social History Narrative   Has two step children- ages 65 and 85 that permanently stays with her. Patient also has a biological 32 yr old son. 29 month old . (12/01/21)   Social Determinants of Health   Financial Resource Strain: Low Risk  (08/31/2018)   Overall Financial Resource Strain (CARDIA)    Difficulty of Paying Living Expenses: Not hard at all  Food Insecurity: No Food Insecurity (08/31/2018)   Hunger Vital Sign    Worried About Running Out of Food in the Last Year: Never true    Ran Out of Food in the Last Year: Never true  Transportation Needs: No Transportation Needs (08/31/2018)   PRAPARE - Administrator, Civil Service (Medical): No    Lack of Transportation (Non-Medical): No  Physical Activity: Insufficiently Active (08/31/2018)   Exercise Vital Sign    Days of Exercise per Week: 1 day    Minutes of Exercise per Session: 60 min  Stress: Stress Concern Present (08/31/2018)   [employer] of Occupational Health - Occupational Stress Questionnaire    Feeling of Stress : Very much  Social Connections: Somewhat Isolated (08/31/2018)   Social Connection and Isolation Panel [NHANES]    Frequency of Communication with Friends and Family: More than three times a week    Frequency of Social Gatherings with Friends and Family: Once a week    Attends Religious Services: Never    Database administrator or Organizations: No    Attends Engineer, structural: Never    Marital Status: Married    Family History  Problem Relation Age of Onset   Ovarian cysts Mother    Stroke Maternal Grandmother    Cancer Maternal Grandmother 27       uterine cancer   Lung cancer Paternal Grandfather    Diabetes Maternal Great-grandmother    Lung cancer Maternal Great-grandmother     Medications Prior to Admission   Medication Sig Dispense Refill Last Dose   albuterol (VENTOLIN HFA) 108 (90 Base) MCG/ACT inhaler Inhale 2 puffs into the lungs every 6 (six) hours as needed for wheezing or shortness of breath.   Past Week   escitalopram (LEXAPRO) 20 MG tablet Take 1 tablet (20 mg total) by mouth daily. After 2 weeks, if tolerating, increase to 20mg  daily. (Patient taking differently: Take 20 mg by mouth daily.) 90 tablet 3 05/03/2022   famotidine (PEPCID) 10 MG tablet Take 10 mg by mouth 2 (two) times daily.   05/03/2022   Prenatal Vit-Fe Fumarate-FA (PRENATAL MULTIVITAMIN) TABS tablet Take 1 tablet by mouth daily  at 12 noon.   05/03/2022   acetaminophen (TYLENOL) 325 MG tablet Take 650 mg by mouth every 6 (six) hours as needed.       Allergies  Allergen Reactions   Sulfa Drugs Cross Reactors Anaphylaxis, Itching and Other (See Comments)    Flu like symptoms    Review of Systems: Negative except for what is mentioned in HPI.  Physical Exam: BP 129/85 (BP Location: Left Arm)   Pulse 99   Temp 97.9 F (36.6 C) (Oral)   Resp 16   Ht 5\' 10"  (1.778 m)   Wt 127.5 kg   LMP 07/28/2021 (Exact Date)   BMI 40.32 kg/m  FHR by Doppler: 145 bpm GENERAL: Well-developed, well-nourished female in no acute distress.  LUNGS: Clear to auscultation bilaterally.  HEART: Regular rate and rhythm. ABDOMEN: Soft, nontender, nondistended, gravid, well-healed Pfannenstiel incision. PELVIC: Deferred. Bedside sono still confirms breech presentation.  EXTREMITIES: Nontender, no edema, 2+ distal pulses.   Pertinent Labs/Studies:   Results for orders placed or performed during the hospital encounter of 05/04/22 (from the past 72 hour(s))  Type and screen     Status: None (Preliminary result)   Collection Time: 05/04/22 10:36 AM  Result Value Ref Range   ABO/RH(D) A NEG    Antibody Screen POS    Sample Expiration      05/07/2022,2359 Performed at [facility], [address] [place], [place]     Antibody Identification PENDING   CBC     Status: Abnormal   Collection Time: 05/04/22 10:36 AM  Result Value Ref Range   WBC 10.9 (H) 4.0 - 10.5 K/uL   RBC 4.66 3.87 - 5.11 MIL/uL   Hemoglobin 14.2 12.0 - 15.0 g/dL   HCT 05/06/22 63.8 - 75.6 %   MCV 88.8 80.0 - 100.0 fL   MCH 30.5 26.0 - 34.0 pg   MCHC 34.3 30.0 - 36.0 g/dL   RDW 43.3 29.5 - 18.8 %   Platelets 264 150 - 400 K/uL   nRBC 0.0 0.0 - 0.2 %    Comment: Performed at [facility], [address] [place], [place]    Assessment and Plan  - Curtistine Pettitt is a 31 y.o. 26 at [redacted]w[redacted]d being admitted  for scheduled repeat cesarean section delivery . The patient is understanding of the planned procedure and is aware of and accepting of all surgical risks, including but not limited to: bleeding which may require transfusion or reoperation; infection which may require antibiotics; injury to bowel, bladder, ureters or other surrounding organs which may require repair; injury to the fetus; need for additional procedures including hysterectomy in the event of life-threatening complications; placental abnormalities wth subsequent pregnancies; incisional problems; blood clot disorders which may require blood thinners;, and other postoperative/anesthesia complications. The patient is in agreement with the proposed plan, and gives informed written consent for the procedure. All questions have been answered. - Rh negative for Rhogam postpartum.    [redacted]w[redacted]d, MD [facility]

## 2022-05-04 NOTE — Op Note (Signed)
Cesarean Section Procedure Note  Indications: malpresentation: breech  Pre-operative Diagnosis: 39 week 0 day pregnancy, breech presentation, h/o C-section x 1 with successful VBAC, obesity in pregnancy, Rh negative.  Post-operative Diagnosis: Same  Surgeon: Hildred Laser, MD  Assistants:  Brennan Bailey, MD  Procedure: Repeat low transverse Cesarean Section  Anesthesia: Spinal anesthesia  Findings: Female infant, cephalic presentation, 7 lbs 6 oz, with Apgar scores of 8 at one minute and 9 at five minutes. Intact placenta with 3 vessel cord.  Clear amniotic fluid at amniotomy The uterine outline, tubes and ovaries appeared normal.   Procedure Details: The patient was seen in the Holding Room. The risks, benefits, complications, treatment options, and expected outcomes were discussed with the patient.  The patient concurred with the proposed plan, giving informed consent.  The site of surgery properly noted/marked. The patient was taken to the Operating Room, identified as Silvina Hackleman and the procedure verified as C-Section Delivery. A Time Out was held and the above information confirmed.  After induction of anesthesia, the patient was draped and prepped in the usual sterile manner. Anesthesia was tested and noted to be adequate. A Pfannenstiel incision was made and carried down through the subcutaneous tissue to the fascia. Fascial incision was made and extended transversely. The fascia was separated from the underlying rectus tissue superiorly and inferiorly. The peritoneum was identified and entered. Peritoneal incision was extended longitudinally. The surgical assist was able to provide retraction to allow for clear visualization of surgical site. An Alexis retractor was placed in the abdomen for additional retraction. The utero-vesical peritoneal reflection was incised transversely and the bladder flap was bluntly freed from the lower uterine segment. A low transverse uterine incision  was made. Delivered from complete breech presentation was a 7 lb 6 oz Female with Apgar scores of 8 at one minute and 9 at five minutes.  The assistant was able to apply adequate fundal pressure to allow for successful delivery of the fetus. After the umbilical cord was clamped and cut, cord blood was obtained for evaluation. Delayed cord clamping was observed. The placenta was removed intact and appeared normal. The uterus was exteriorized and cleared of all clots and debris. The uterine outline, tubes and ovaries appeared normal.  The uterine incision was closed with running locked sutures of 0-Vicryl.  A second suture of 0-Vicryl was used in an imbricating layer.  Hemostasis was observed. The uterus was then returned to the abdomen. The pericolic gutters were cleared of all clots and debris. The peritoneum was reapproximated using 3-0 Vicryl in a running fashion. Seven ml of ZynRelef was placed over the peritoneal layer.  The fascia was then reapproximated with a running suture of 0-Vicryl. Another 5 ml of ZynRelef was placed over the fascial incision. The subcutaneous fat layer was reapproximated with 2-0 Vicryl. The skin was reapproximated with 4-0 Monocryl. The incision was covered with steri-strips and a pressure dressing.   Instrument, sponge, and needle counts were correct prior the abdominal closure and at the conclusion of the case.    An experienced assistant was required given the standard of surgical care given the complexity of the case.  This assistant was needed for exposure, dissection, suctioning, retraction, instrument exchange, and for overall help during the procedure.  Estimated Blood Loss:  400 ml      Drains: foley catheter to gravity drainage, 70 ml of clear urine at end of the procedure         Total IV Fluids:  500  ml  Specimens: Cord blood, sent for evaluation         Implants: None         Complications:  None; patient tolerated the procedure well.         Disposition:  PACU - hemodynamically stable.         Condition: stable   Hildred Laser, MD [facility]

## 2022-05-04 NOTE — Anesthesia Procedure Notes (Signed)
Spinal  Patient location during procedure: OR Start time: 05/04/2022 12:30 PM End time: 05/04/2022 12:34 PM Reason for block: surgical anesthesia Staffing Performed: anesthesiologist  Anesthesiologist: Reed Breech, MD Resident/CRNA: Jeanine Luz, CRNA Performed by: Reed Breech, MD Authorized by: Reed Breech, MD   Preanesthetic Checklist Completed: patient identified, IV checked, site marked, risks and benefits discussed, surgical consent, monitors and equipment checked, pre-op evaluation and timeout performed Spinal Block Patient position: sitting Prep: ChloraPrep Patient monitoring: heart rate, cardiac monitor, continuous pulse ox and blood pressure Approach: midline Location: L2-3 Injection technique: single-shot Needle Needle type: Sprotte and Whitacre  Needle gauge: 22 G Needle length: 9 cm Assessment Sensory level: T4 Events: CSF return Additional Notes Initial attempt by Joanette Gula at L4-5; 2nd attempt by [facility] at L3-4 with Whitacre 22g.

## 2022-05-04 NOTE — Lactation Note (Signed)
This note was copied from a baby's chart. Lactation Consultation Note  Patient Name: Emily Payne HUDJS'H Date: 05/04/2022 Reason for consult: L&D Initial assessment;Term;Other (Comment) (Repeat c-section/Breech) Age:31 hours  Maternal Data Has patient been taught Hand Expression?: Yes Does the patient have breastfeeding experience prior to this delivery?: Yes How long did the patient breastfeed?: 6 months, 56 months (52 month old ("self weaned"), 64 month old (milk allergy))  Overall experienced breastfeeder. Desires breastfeeding with this baby as well. C-section delivery due to breech presentation.  Feeding Mother's Current Feeding Choice: Breast Milk  Baby was active at the breast upon entry.  LATCH Score Latch: Grasps breast easily, tongue down, lips flanged, rhythmical sucking. (baby was latched with flanged lips)  Audible Swallowing: A few with stimulation  Type of Nipple: Everted at rest and after stimulation  Comfort (Breast/Nipple): Soft / non-tender  Hold (Positioning): Assistance needed to correctly position infant at breast and maintain latch.  LATCH Score: 8  LC assessed the position and latch of active baby at the breast. Lips were flanged with wide angle in corner. Baby was somewhat on top of the breast, but due to c-section mom has comfortable. Baby had rhythmic sucking pattern and LC identified 2-3 swallows during time in the room.  Lactation Tools Discussed/Used    Interventions Interventions: Breast feeding basics reviewed;Hand express;Breast compression;Education  Reviewed early 24hr feeding patterns and behaviors, stomach size, early hunger cues, feeding on demand/not the clock, and output expectations.  Discharge    Consult Status Consult Status: Follow-up from L&D    [name] 05/04/2022, 2:57 PM

## 2022-05-05 ENCOUNTER — Encounter: Payer: Self-pay | Admitting: Obstetrics and Gynecology

## 2022-05-05 LAB — CBC
HCT: 33.9 % — ABNORMAL LOW (ref 36.0–46.0)
Hemoglobin: 11.6 g/dL — ABNORMAL LOW (ref 12.0–15.0)
MCH: 31 pg (ref 26.0–34.0)
MCHC: 34.2 g/dL (ref 30.0–36.0)
MCV: 90.6 fL (ref 80.0–100.0)
Platelets: 189 10*3/uL (ref 150–400)
RBC: 3.74 MIL/uL — ABNORMAL LOW (ref 3.87–5.11)
RDW: 13.2 % (ref 11.5–15.5)
WBC: 12 10*3/uL — ABNORMAL HIGH (ref 4.0–10.5)
nRBC: 0 % (ref 0.0–0.2)

## 2022-05-05 MED ORDER — TRAMADOL HCL 50 MG PO TABS: 50.0000 mg | ORAL_TABLET | Freq: Four times a day (QID) | ORAL | 0 refills | Status: DC | PRN

## 2022-05-05 MED ORDER — IBUPROFEN 600 MG PO TABS: 600.0000 mg | ORAL_TABLET | Freq: Four times a day (QID) | ORAL | 0 refills | Status: DC

## 2022-05-05 NOTE — Progress Notes (Signed)
Patient responded well to teaching. Had no questions at this time.

## 2022-05-05 NOTE — Lactation Note (Signed)
This note was copied from a baby's chart. Lactation Consultation Note  Patient Name: Emily Payne OVFIE'P Date: 05/05/2022 Reason for consult: Follow-up assessment;Term;Other (Comment) (repeat c-section; breech) Age:31 hours  Maternal Data Has patient been taught Hand Expression?: Yes Does the patient have breastfeeding experience prior to this delivery?: Yes How long did the patient breastfeed?: 6 months, 2 months  Feeding Mother's Current Feeding Choice: Breast Milk  Baby has fed well, output exceeds expectations. Mom experiencing a little discomfort that sounds like nipple tenderness with initial latch  LATCH Score Baby did not latch during this visit.   Lactation Tools Discussed/Used Tools: Comfort gels  Interventions Interventions: Breast feeding basics reviewed;Hand express;Comfort gels;Coconut oil;Education (monitoring position/latch, transient nipple tenderness)  Reviewed feeding behaviors/patterns, cluster feeding, transient nipple tenderness, use of comfort gel/lanolin/coconut oil to help with discomfort. Encouraged to monitor latch- flanged lips, wide mouth, and baby turned in towards the breast.  Discharge Pump: Personal  Consult Status Consult Status: PRN  Updated whiteboard with LC name/number.  [name] 05/05/2022, 10:36 AM

## 2022-05-05 NOTE — Anesthesia Post-op Follow-up Note (Signed)
  Anesthesia Pain Follow-up Note  Patient: Emily Payne  Day #: 1  Date of Follow-up: 05/05/2022 Time: 7:26 AM  Last Vitals:  Vitals:   05/04/22 2340 05/05/22 0344  BP: 109/69 120/60  Pulse: 72 78  Resp: 18 18  Temp: 36.8 C 36.8 C  SpO2: 97% 100%    Level of Consciousness: alert  Pain: none   Side Effects:None  Catheter Site Exam:clean, dry, no drainage     Plan: D/C from anesthesia care at surgeon's request  Elmarie Mainland

## 2022-05-05 NOTE — Anesthesia Postprocedure Evaluation (Signed)
Anesthesia Post Note  Patient: Emily Payne  Procedure(s) Performed: CESAREAN SECTION  Patient location during evaluation: Mother Baby Anesthesia Type: Spinal Level of consciousness: oriented and awake and alert Pain management: pain level controlled Vital Signs Assessment: post-procedure vital signs reviewed and stable Respiratory status: spontaneous breathing and respiratory function stable Cardiovascular status: blood pressure returned to baseline and stable Postop Assessment: no headache, no backache, no apparent nausea or vomiting and able to ambulate Anesthetic complications: no   No notable events documented.   Last Vitals:  Vitals:   05/04/22 2340 05/05/22 0344  BP: 109/69 120/60  Pulse: 72 78  Resp: 18 18  Temp: 36.8 C 36.8 C  SpO2: 97% 100%    Last Pain:  Vitals:   05/05/22 0345  TempSrc:   PainSc: 0-No pain                 Fayrene Towner B Alonza Smoker

## 2022-05-05 NOTE — Progress Notes (Addendum)
Postpartum Day # 1: Cesarean Delivery (repeat) - breech presentation  Subjective: Patient reports tolerating PO and no problems voiding.  Has not yet passed flatus. Ambulating without difficulty. Pain is minimal. Denies issues with  breastfeeding.  Desires to go home today if possible.   Objective: Vital signs in last 24 hours: Temp:  [97.6 F (36.4 C)-98.3 F (36.8 C)] 98.2 F (36.8 C) (08/16 0344) Pulse Rate:  [65-105] 78 (08/16 0344) Resp:  [7-29] 18 (08/16 0344) BP: (100-141)/(57-85) 120/60 (08/16 0344) SpO2:  [89 %-100 %] 100 % (08/16 0344) Weight:  [127.5 kg] 127.5 kg (08/15 1029)  Physical Exam:  General: alert and no distress Lungs: clear to auscultation bilaterally Breasts: normal appearance, no masses or tenderness Heart: regular rate and rhythm, S1, S2 normal, no murmur, click, rub or gallop Abdomen: soft, non-tender; bowel sounds normal; no masses,  no organomegaly Pelvis: Lochia appropriate, Uterine Fundus firm, Incision: bandage clean/dry/intact.  Extremities: DVT Evaluation: No evidence of DVT seen on physical exam. Negative Homan's sign. No cords or calf tenderness. No significant calf/ankle edema.  Recent Labs    05/04/22 1036 05/05/22 0557  HGB 14.2 11.6*  HCT 41.4 33.9*    Assessment/Plan: Status post Cesarean section. Doing well postoperatively.  Breastfeeding, doing well Contraception: vasectomy Regular diet Continue PO pain management Rh neg, for rhoam if indicated. Discharge home with standard precautions and return to clinic in 1-2 weeks for incision check.  Hildred Laser, MD [facility]

## 2022-05-05 NOTE — Discharge Summary (Signed)
Postpartum Discharge Summary     Patient Name: Emily Payne DOB: [birth year] MRN: [id number]  Date of admission: 05/04/2022 Delivery date:05/04/2022  Delivering provider: Rubie Maid  Date of discharge: 05/05/2022  Admitting diagnosis: Breech presentation of fetus [O32.1XX0] Intrauterine pregnancy: [redacted]w[redacted]d    Secondary diagnosis:  Principal Problem:   Breech presentation of fetus  Additional problems: Obesity of pregnancy, history of anxiety    Discharge diagnosis: Term Pregnancy Delivered                                              Post partum procedures:rhogam Augmentation: N/A Complications: None  Hospital course: Sceduled C/S   31y.o. yo [id number] admitted to the hospital 05/04/2022 for scheduled cesarean section with the following indication:Malpresentation.Delivery details are as follows:  Membrane Rupture Time/Date: 1:01 PM ,05/04/2022   Delivery Method:C-Section, Low Transverse  Details of operation can be found in separate operative note.  Patient had an uncomplicated postpartum course.  She is ambulating, tolerating a regular diet, passing flatus, and urinating well. Patient is discharged home in stable condition on  05/05/22        Newborn Data: Birth date:05/04/2022  Birth time:1:01 PM  Gender:Female  Living status:Living  Apgars:8 ,9  Weight:3350 g     Magnesium Sulfate received: No BMZ received: No Rhophylac:Yes MMR:No T-DaP:Given prenatally Flu: No Transfusion:No  Physical exam  Vitals:   05/04/22 1930 05/04/22 2340 05/05/22 0344 05/05/22 0821  BP: 115/74 109/69 120/60 109/76  Pulse: 87 72 78 73  Resp: 18 18 18 18   Temp: 98.1 F (36.7 C) 98.3 F (36.8 C) 98.2 F (36.8 C) (!) 97.5 F (36.4 C)  TempSrc: Oral Oral Oral Oral  SpO2: 99% 97% 100% 100%  Weight:      Height:       General: alert, cooperative, and no distress Lochia: appropriate Uterine Fundus: firm Incision: Healing well with no significant drainage, No significant  erythema DVT Evaluation: No evidence of DVT seen on physical exam. Negative Homan's sign. No cords or calf tenderness. No significant calf/ankle edema.   Labs: Lab Results  Component Value Date   WBC 12.0 (H) 05/05/2022   HGB 11.6 (L) 05/05/2022   HCT 33.9 (L) 05/05/2022   MCV 90.6 05/05/2022   PLT 189 05/05/2022    Edinburgh Score:    05/04/2022    7:30 PM  Edinburgh Postnatal Depression Scale Screening Tool  I have been able to laugh and see the funny side of things. 0  I have looked forward with enjoyment to things. 0  I have blamed myself unnecessarily when things went wrong. 0  I have been anxious or worried for no good reason. 2  I have felt scared or panicky for no good reason. 1  Things have been getting on top of me. 2  I have been so unhappy that I have had difficulty sleeping. 0  I have felt sad or miserable. 0  I have been so unhappy that I have been crying. 0  The thought of harming myself has occurred to me. 0  Edinburgh Postnatal Depression Scale Total 5      After visit meds:  Allergies as of 05/05/2022       Reactions   Sulfa Drugs Cross Reactors Anaphylaxis, Itching, Other (See Comments)   Flu like symptoms  Medication List     TAKE these medications    acetaminophen 325 MG tablet Commonly known as: TYLENOL Take 650 mg by mouth every 6 (six) hours as needed.   albuterol 108 (90 Base) MCG/ACT inhaler Commonly known as: VENTOLIN HFA Inhale 2 puffs into the lungs every 6 (six) hours as needed for wheezing or shortness of breath.   escitalopram 20 MG tablet Commonly known as: LEXAPRO Take 1 tablet (20 mg total) by mouth daily. After 2 weeks, if tolerating, increase to 37m daily. What changed: additional instructions   famotidine 10 MG tablet Commonly known as: PEPCID Take 10 mg by mouth 2 (two) times daily.   ibuprofen 600 MG tablet Commonly known as: ADVIL Take 1 tablet (600 mg total) by mouth every 6 (six) hours.   prenatal  multivitamin Tabs tablet Take 1 tablet by mouth daily at 12 noon.   traMADol 50 MG tablet Commonly known as: ULTRAM Take 1-2 tablets (50-100 mg total) by mouth every 6 (six) hours as needed for moderate pain.         Discharge home in stable condition Infant Feeding: Breast Infant Disposition:home with mother Discharge instruction: per After Visit Summary and Postpartum booklet. Activity: Advance as tolerated. Pelvic rest for 6 weeks.  Diet: routine diet Anticipated Birth Control:  Vasectomy Postpartum Appointment:6 weeks Additional Postpartum F/U: Incision check 1 week Future Appointments: Future Appointments  Date Time Provider DLula 05/11/2022  8:15 AM GRod Can CNM [facility] None   Follow up Visit:  FOberlinFollow up today.   Specialty: Obstetrics and Gynecology Why: 1-2 weeks for incision check 6 weeks postpartum visit Contact information: [phone]                   05/05/2022 ARubie Maid MD

## 2022-05-06 LAB — TYPE AND SCREEN
ABO/RH(D): A NEG
Antibody Screen: POSITIVE
Unit division: 0
Unit division: 0

## 2022-05-06 LAB — BPAM RBC
Blood Product Expiration Date: 202308312359
Blood Product Expiration Date: 202308312359
Unit Type and Rh: 600
Unit Type and Rh: 600

## 2022-05-11 ENCOUNTER — Encounter: Payer: Self-pay | Admitting: Advanced Practice Midwife

## 2022-05-11 ENCOUNTER — Ambulatory Visit (INDEPENDENT_AMBULATORY_CARE_PROVIDER_SITE_OTHER): Payer: BC Managed Care – PPO | Admitting: Advanced Practice Midwife

## 2022-05-11 VITALS — BP 120/80 | Ht 70.0 in | Wt 275.0 lb

## 2022-05-11 DIAGNOSIS — Z09 Encounter for follow-up examination after completed treatment for conditions other than malignant neoplasm: Secondary | ICD-10-CM

## 2022-05-11 NOTE — Progress Notes (Signed)
Patient ID: Emily Payne, female   DOB: 10/13/1990, 31 y.o.   MRN: [id number]  Reason for Consult: Wound Check   Subjective:  HPI:  Emily Payne is a 31 y.o. female being seen at 1 week postpartum for incision check. She has no concerns at this time. She continues to breastfeed. Bleeding is light.   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 with aura    Poor fetal growth affecting management of mother in third trimester    Single liveborn infant delivered vaginally 06/09/2020   Supervision of high risk pregnancy in first trimester 11/14/2019    Nursing Staff Provider  Office Location  [facility]   LMP=7wk [place]  Language  English Anatomy US  Incomplete, suboptimal scan with MFM, close exam after birth advised  Flu Vaccine   Genetic Screen  NIPS:normal XX  TDaP vaccine   given 7/16 Hgb A1C or  GTT Early :102 Third trimester :   Rhogam  03/07/20   LAB RESULTS   Feeding Plan  breast Blood Type A/Negative/-- (02/24 1026)   Contraception  A   VBAC, delivered, current hospitalization 06/09/2020   Family History  Problem Relation Age of Onset   Ovarian cysts Mother    Stroke Maternal Grandmother    Cancer Maternal Grandmother 27       uterine cancer   Lung cancer Paternal Grandfather    Diabetes Maternal Great-grandmother    Lung cancer Maternal Great-grandmother    Past Surgical History:  Procedure Laterality Date   abscess tooth     CESAREAN SECTION N/A 10/01/2014   Procedure: PRIMARY CESAREAN SECTION;  Surgeon: Adam Phenix, MD;  Location: [facility];  Service: Obstetrics;  Laterality: N/A;   CESAREAN SECTION N/A 05/04/2022   Procedure: CESAREAN SECTION;  Surgeon: Hildred Laser, MD;  Location: [facility];  Service: Obstetrics;  Laterality: N/A;   DIAGNOSTIC LAPAROSCOPY WITH  REMOVAL OF ECTOPIC PREGNANCY N/A 06/27/2019   Procedure: DIAGNOSTIC LAPAROSCOPY , evacuation or hemoperitoneum;  Surgeon: Natale Milch, MD;  Location: [facility];  Service: Gynecology;  Laterality: N/A;   LAPAROSCOPIC UNILATERAL SALPINGECTOMY Right 06/27/2019   Procedure: LAPAROSCOPIC UNILATERAL SALPINGECTOMY;  Surgeon: Natale Milch, MD;  Location: [facility];  Service: Gynecology;  Laterality: Right;   TONSILLECTOMY     WISDOM TOOTH EXTRACTION      Short Social History:  Social History   Tobacco Use   Smoking status: Never   Smokeless tobacco: Never  Substance Use Topics   Alcohol use: Not Currently    Alcohol/week: 0.0 standard drinks of alcohol    Comment: rare but none with pregnancy    Allergies  Allergen Reactions   Sulfa Drugs Cross Reactors Anaphylaxis, Itching and Other (See Comments)    Flu like symptoms    Current Outpatient Medications  Medication Sig Dispense Refill   acetaminophen (TYLENOL) 325 MG tablet Take 650 mg by mouth every 6 (six) hours as needed.     albuterol (VENTOLIN HFA) 108 (90 Base) MCG/ACT inhaler Inhale 2 puffs into the lungs every 6 (six) hours as needed for wheezing or shortness of breath.     escitalopram (LEXAPRO) 20 MG tablet Take 1 tablet (20 mg total) by mouth  daily. After 2 weeks, if tolerating, increase to 20mg  daily. (Patient taking differently: Take 20 mg by mouth daily.) 90 tablet 3   famotidine (PEPCID) 10 MG tablet Take 10 mg by mouth 2 (two) times daily.     ibuprofen (ADVIL) 600 MG tablet Take 1 tablet (600 mg total) by mouth every 6 (six) hours. 30 tablet 0   Prenatal Vit-Fe Fumarate-FA (PRENATAL MULTIVITAMIN) TABS tablet Take 1 tablet by mouth daily at 12 noon.     traMADol (ULTRAM) 50 MG tablet Take 1-2 tablets (50-100 mg total) by mouth every 6 (six) hours as needed for moderate pain. 30 tablet 0   No current facility-administered medications for this visit.    Review of Systems  Constitutional:  Negative for  chills and fever.  HENT:  Negative for congestion, ear discharge, ear pain, hearing loss, sinus pain and sore throat.   Eyes:  Negative for blurred vision and double vision.  Respiratory:  Negative for cough, shortness of breath and wheezing.   Cardiovascular:  Negative for chest pain, palpitations and leg swelling.  Gastrointestinal:  Negative for abdominal pain, blood in stool, constipation, diarrhea, heartburn, melena, nausea and vomiting.  Genitourinary:  Negative for dysuria, flank pain, frequency, hematuria and urgency.  Musculoskeletal:  Negative for back pain, joint pain and myalgias.  Skin:  Negative for itching and rash.  Neurological:  Negative for dizziness, tingling, tremors, sensory change, speech change, focal weakness, seizures, loss of consciousness, weakness and headaches.  Endo/Heme/Allergies:  Negative for environmental allergies. Does not bruise/bleed easily.  Psychiatric/Behavioral:  Negative for depression, hallucinations, memory loss, substance abuse and suicidal ideas. The patient is not nervous/anxious and does not have insomnia.         Objective:  Objective   Vital Signs: BP 120/80   Ht 5\' 10"  (1.778 m)   Wt 275 lb (124.7 kg)   LMP 07/28/2021 (Exact Date)   Breastfeeding Yes   BMI 39.46 kg/m  Constitutional: Well nourished, well developed female in no acute distress.  HEENT: normal Skin: Warm and dry.  Cardiovascular: Regular rate and rhythm.   Respiratory: Clear to auscultation bilateral. Normal respiratory effort Abdomen: c/s incision: honeycomb dressing removed, steri strips intact with scant dried sanguinous drainage, no s/s infection Neuro: DTRs 2+, Cranial nerves grossly intact Psych: Alert and Oriented x3. No memory deficits. Normal mood and affect.    Edinburgh Postnatal Depression Scale - 05/11/22 0854       Edinburgh Postnatal Depression Scale:  In the Past 7 Days   I have been able to laugh and see the funny side of things. 0    I have  looked forward with enjoyment to things. 0    I have blamed myself unnecessarily when things went wrong. 0    I have been anxious or worried for no good reason. 1    I have felt scared or panicky for no good reason. 1    Things have been getting on top of me. 0    I have been so unhappy that I have had difficulty sleeping. 0    I have felt sad or miserable. 0    I have been so unhappy that I have been crying. 0    The thought of harming myself has occurred to me. 0    Edinburgh Postnatal Depression Scale Total 2             Assessment/Plan:     31 y.o. G38 P24 female 1 week postpartum s/p cesarean  section, normal wound healing  Return to clinic in 5 weeks for 6 week postpartum visit Contraception: vasectomy, she also plans to have ablation   [name] CNM [facility] 05/11/2022, 2:38 PM

## 2022-06-03 ENCOUNTER — Other Ambulatory Visit: Payer: Self-pay

## 2022-06-03 MED ORDER — ESCITALOPRAM OXALATE 20 MG PO TABS: 20.0000 mg | ORAL_TABLET | Freq: Every day | ORAL | 0 refills | Status: DC

## 2022-06-15 ENCOUNTER — Ambulatory Visit: Payer: BC Managed Care – PPO | Admitting: Obstetrics & Gynecology

## 2022-07-01 ENCOUNTER — Encounter: Payer: Self-pay | Admitting: Obstetrics

## 2022-07-01 ENCOUNTER — Ambulatory Visit (INDEPENDENT_AMBULATORY_CARE_PROVIDER_SITE_OTHER): Payer: BC Managed Care – PPO | Admitting: Obstetrics

## 2022-07-01 DIAGNOSIS — F410 Panic disorder [episodic paroxysmal anxiety] without agoraphobia: Secondary | ICD-10-CM

## 2022-07-01 MED ORDER — LORAZEPAM 0.5 MG PO TABS: 0.5000 mg | ORAL_TABLET | Freq: Three times a day (TID) | ORAL | 0 refills | Status: DC

## 2022-07-01 MED ORDER — ESCITALOPRAM OXALATE 20 MG PO TABS: 20.0000 mg | ORAL_TABLET | Freq: Every day | ORAL | 4 refills | Status: DC

## 2022-07-01 MED ORDER — LORAZEPAM 0.5 MG PO TABS: 0.5000 mg | ORAL_TABLET | Freq: Three times a day (TID) | ORAL | 0 refills | Status: DC | PRN

## 2022-07-01 NOTE — Progress Notes (Signed)
Postpartum Visit  Chief Complaint:  Chief Complaint  Patient presents with   Postpartum Care    History of Present Illness: Patient is a 31 y.o. [id number] presents for postpartum visit.she had a CS for breech in august. Reports that she is doing very well and was pleased that the healing process was relatively easy. Her husband has proceeded with vasectomy. She is continuing to breastfeed.  Date of delivery: 05/04/2022 Type of delivery: Cesarean section due to breehc presentation. Episiotomy No.  Pregnancy or labor problems:   Any problems since the delivery:  no  Newborn Details:  SINGLETON :  1. Baby's name: female. Birth weight: 3350 gms Maternal Details:  Breast Feeding:  yes Post partum depression/anxiety noted:  no Edinburgh Post-Partum Depression Score:  5  Date of last PAP: 2021  normal   Past Medical History:  Diagnosis Date   [redacted] weeks gestation of pregnancy    Anxiety    Bipolar disorder ([place])    no med currently   Depression    Ectopic pregnancy    Hypoglycemic syndrome    Illicit drug use 25/42/7062   Migraines    otc med prn. with aura    Poor fetal growth affecting management of mother in third trimester    Rh negative state in antepartum period 03/09/2014   Needs rhogam at 28 weeks   Single liveborn infant delivered vaginally 06/09/2020   Supervision of high risk pregnancy in first trimester 11/14/2019    Nursing Staff Provider  Office Location  [facility]   LMP=7wk [place]  Language  English Anatomy US  Incomplete, suboptimal scan with MFM, close exam after birth advised  Flu Vaccine   Genetic Screen  NIPS:normal XX  TDaP vaccine   given 7/16 Hgb A1C or  GTT Early :102 Third trimester :   Rhogam  03/07/20   LAB RESULTS   Feeding Plan  breast Blood Type A/Negative/-- (02/24 1026)   Contraception  A   VBAC, delivered, current hospitalization 06/09/2020    Past Surgical History:  Procedure Laterality Date   abscess tooth     CESAREAN SECTION N/A 10/01/2014    Procedure: PRIMARY CESAREAN SECTION;  Surgeon: Woodroe Mode, MD;  Location: [facility];  Service: Obstetrics;  Laterality: N/A;   CESAREAN SECTION N/A 05/04/2022   Procedure: CESAREAN SECTION;  Surgeon: Rubie Maid, MD;  Location: [facility];  Service: Obstetrics;  Laterality: N/A;   DIAGNOSTIC LAPAROSCOPY WITH REMOVAL OF ECTOPIC PREGNANCY N/A 06/27/2019   Procedure: DIAGNOSTIC LAPAROSCOPY , evacuation or hemoperitoneum;  Surgeon: Homero Fellers, MD;  Location: [facility];  Service: Gynecology;  Laterality: N/A;   LAPAROSCOPIC UNILATERAL SALPINGECTOMY Right 06/27/2019   Procedure: LAPAROSCOPIC UNILATERAL SALPINGECTOMY;  Surgeon: Homero Fellers, MD;  Location: [facility];  Service: Gynecology;  Laterality: Right;   TONSILLECTOMY     WISDOM TOOTH EXTRACTION      Prior to Admission medications   Medication Sig Start Date End Date Taking? Authorizing Provider  acetaminophen (TYLENOL) 325 MG tablet Take 650 mg by mouth every 6 (six) hours as needed.   Yes [provider]  albuterol (VENTOLIN HFA) 108 (90 Base) MCG/ACT inhaler Inhale 2 puffs into the lungs every 6 (six) hours as needed for wheezing or shortness of breath.   Yes [provider]  escitalopram (LEXAPRO) 20 MG tablet Take 1 tablet (20 mg total) by mouth daily. 06/03/22  Yes Delsa Grana, PA-C  famotidine (PEPCID) 10 MG tablet Take 10 mg by mouth 2 (two) times  daily.   Yes [provider]  ibuprofen (ADVIL) 600 MG tablet Take 1 tablet (600 mg total) by mouth every 6 (six) hours. 05/05/22  Yes Hildred Laser, MD  Prenatal Vit-Fe Fumarate-FA (PRENATAL MULTIVITAMIN) TABS tablet Take 1 tablet by mouth daily at 12 noon.   Yes [provider]  traMADol (ULTRAM) 50 MG tablet Take 1-2 tablets (50-100 mg total) by mouth every 6 (six) hours as needed for moderate pain. 05/05/22  Yes Hildred Laser, MD    Allergies  Allergen Reactions   Sulfa Drugs Cross Reactors Anaphylaxis, Itching and Other (See Comments)     Flu like symptoms     Social History   Socioeconomic History   Marital status: Married    Spouse name: Viviann Spare   Number of children: 4   Years of education: 17   Highest education level: Bachelor's degree (e.g., BA, AB, BS)  Occupational History   Occupation: Self Employed  Tobacco Use   Smoking status: Never   Smokeless tobacco: Never  Vaping Use   Vaping Use: Never used  Substance and Sexual Activity   Alcohol use: Not Currently    Alcohol/week: 0.0 standard drinks of alcohol    Comment: rare but none with pregnancy   Drug use: No   Sexual activity: Yes    Partners: Male    Birth control/protection: None  Other Topics Concern   Not on file  Social History Narrative   Has two step children- ages 46 and 72 that permanently stays with her. Patient also has a biological 53 yr old son. 14 month old . (12/01/21)   Social Determinants of Health   Financial Resource Strain: Low Risk  (08/31/2018)   Overall Financial Resource Strain (CARDIA)    Difficulty of Paying Living Expenses: Not hard at all  Food Insecurity: No Food Insecurity (08/31/2018)   Hunger Vital Sign    Worried About Running Out of Food in the Last Year: Never true    Ran Out of Food in the Last Year: Never true  Transportation Needs: No Transportation Needs (08/31/2018)   PRAPARE - Administrator, Civil Service (Medical): No    Lack of Transportation (Non-Medical): No  Physical Activity: Insufficiently Active (08/31/2018)   Exercise Vital Sign    Days of Exercise per Week: 1 day    Minutes of Exercise per Session: 60 min  Stress: Stress Concern Present (08/31/2018)   [employer] of Occupational Health - Occupational Stress Questionnaire    Feeling of Stress : Very much  Social Connections: Somewhat Isolated (08/31/2018)   Social Connection and Isolation Panel [NHANES]    Frequency of Communication with Friends and Family: More than three times a week    Frequency of Social Gatherings with  Friends and Family: Once a week    Attends Religious Services: Never    Database administrator or Organizations: No    Attends Banker Meetings: Never    Marital Status: Married  Catering manager Violence: Not At Risk (08/31/2018)   Humiliation, Afraid, Rape, and Kick questionnaire    Fear of Current or Ex-Partner: No    Emotionally Abused: No    Physically Abused: No    Sexually Abused: No    Family History  Problem Relation Age of Onset   Ovarian cysts Mother    Stroke Maternal Grandmother    Cancer Maternal Grandmother 27       uterine cancer   Lung cancer Paternal Grandfather  Diabetes Maternal Great-grandmother    Lung cancer Maternal Great-grandmother     Review of Systems  Constitutional: Negative.   HENT: Negative.    Eyes: Negative.   Respiratory: Negative.    Cardiovascular: Negative.   Gastrointestinal: Negative.   Genitourinary: Negative.   Musculoskeletal: Negative.   Skin: Negative.   Neurological: Negative.   Endo/Heme/Allergies: Negative.   Psychiatric/Behavioral: Negative.       Physical Exam BP 122/70   Ht 5\' 10"  (1.778 m)   Wt 119.7 kg   LMP 07/28/2021 (Exact Date)   Breastfeeding Yes   BMI 37.88 kg/m   Physical Exam Constitutional:      Appearance: Normal appearance. She is obese.  Genitourinary:     Vulva and rectum normal.     Genitourinary Comments: Uterus is involuted.  HENT:     Head: Normocephalic and atraumatic.  Cardiovascular:     Rate and Rhythm: Normal rate and regular rhythm.     Pulses: Normal pulses.     Heart sounds: Normal heart sounds.  Pulmonary:     Effort: Pulmonary effort is normal.     Breath sounds: Normal breath sounds.  Abdominal:     General: Bowel sounds are normal.     Palpations: Abdomen is soft.     Comments: Surgical scar healing well. C, D, I.  Musculoskeletal:        General: Normal range of motion.     Cervical back: Normal range of motion and neck supple.  Neurological:      General: No focal deficit present.     Mental Status: She is alert and oriented to person, place, and time.  Skin:    General: Skin is warm and dry.  Psychiatric:        Mood and Affect: Mood normal.        Behavior: Behavior normal.     Comments: Edinburgh score does not indicate anxiety. Patient does have hx of anxiety with panic attacks. Takes daily Celexa and occasional Valium. Rxs renewed.      Female Chaperone present during breast and/or pelvic exam.  Assessment: 31 y.o. [phone] presenting for 6 week postpartum visit S/P cesarean section doing well  Anxiety with panic attacks- controlled with citalopram and PRN Lorazepam.  Plan: Problem List Items Addressed This Visit   None    1) Contraception Education given regarding options for contraception, including  vasectomy .  2)  Pap - ASCCP guidelines and rational discussed.  Patient opts for 3 screening interval  3) Patient underwent screening for postpartum depression with no concerns noted.  4) Follow up 1 year for routine annual exam  I have prescribed her Celexa for the year, and provided a limited amount of Lorazepam for PRN use.  [id number], CNM  07/01/2022 11:15 AM   07/01/2022 11:11 AM

## 2022-12-07 IMAGING — US US MFM OB TRANSVAGINAL
1 series · 13 of 28 positions shown · non-contrast
Comparison: none

[Series 1: us mfm ob transvaginal · 13 of 71 slices shown]
[im 3/71]
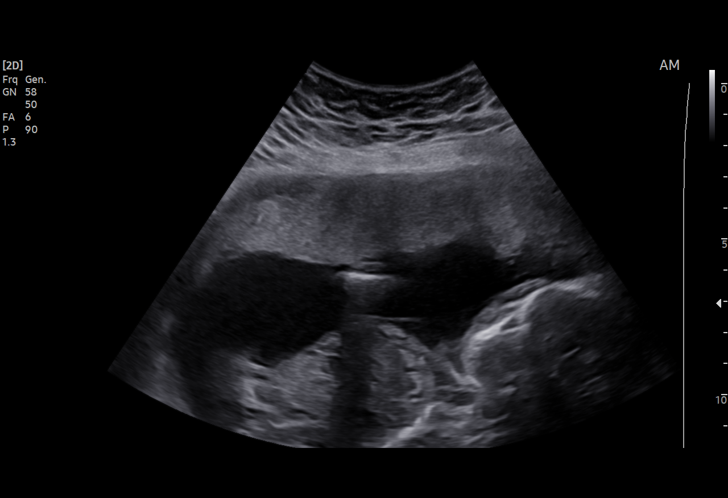
[im 8/71]
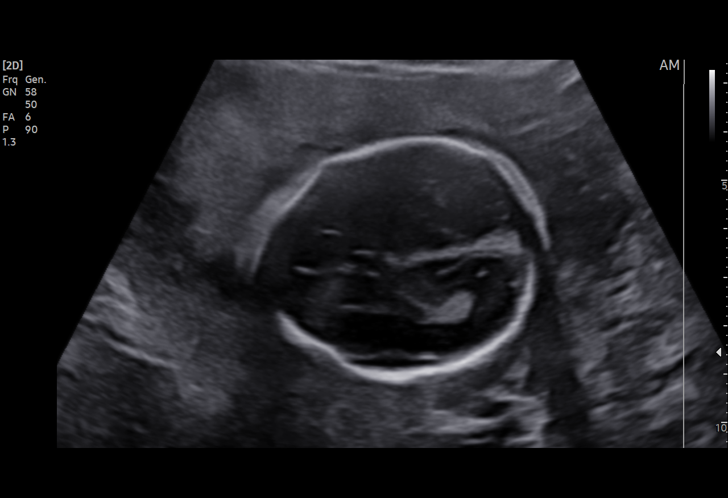
[im 13/71]
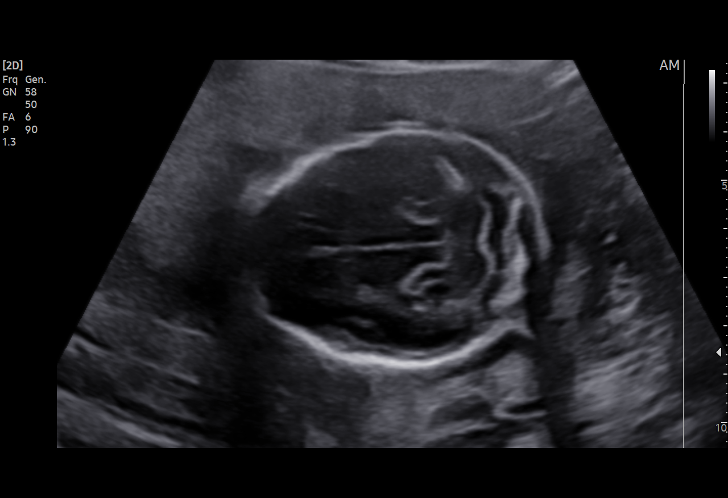
[im 19/71]
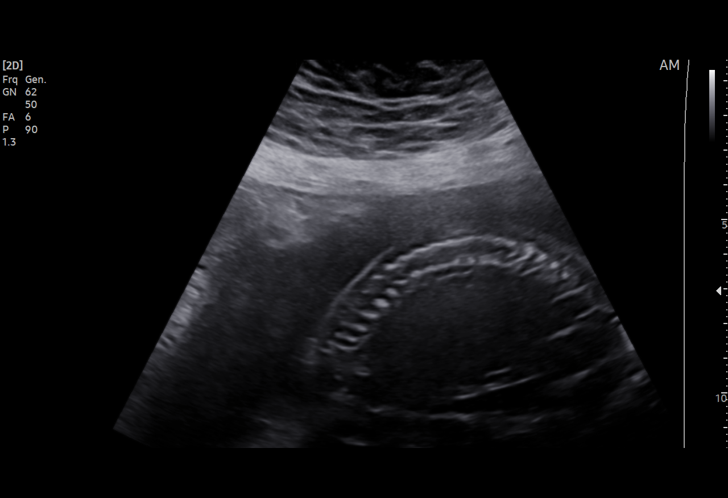
[im 24/71]
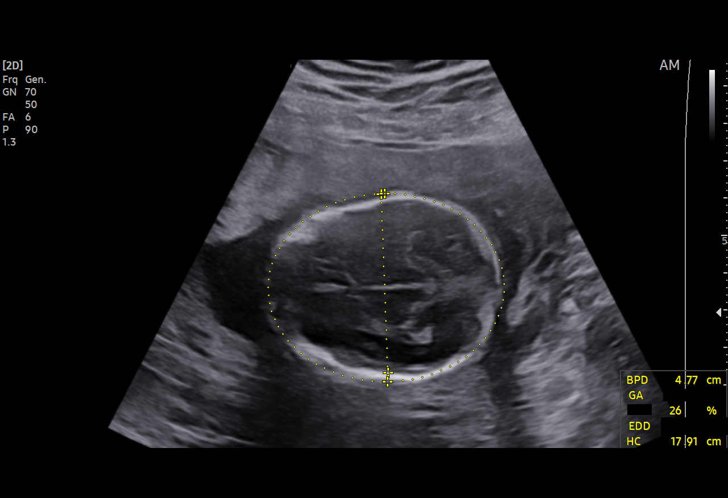
[im 29/71]
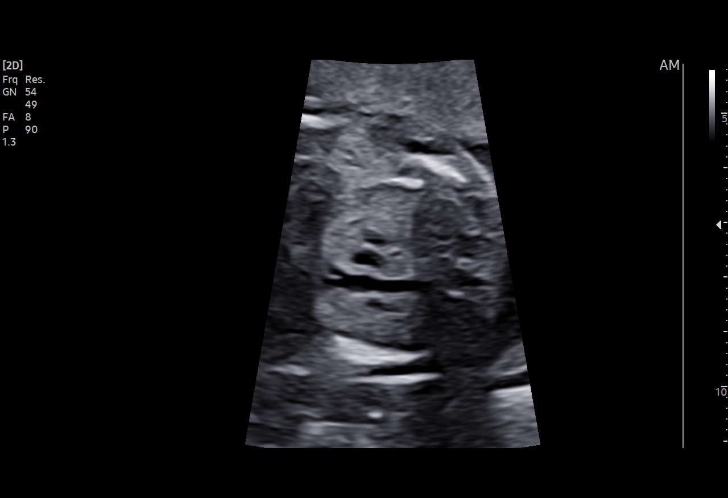
[im 37/71]
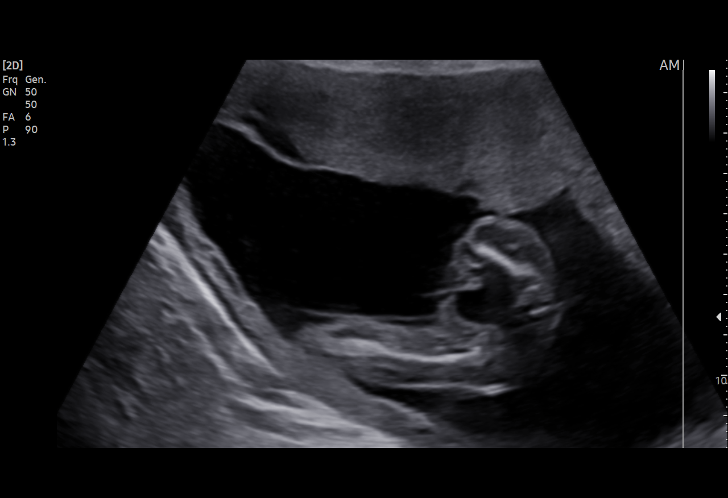
[im 42/71]
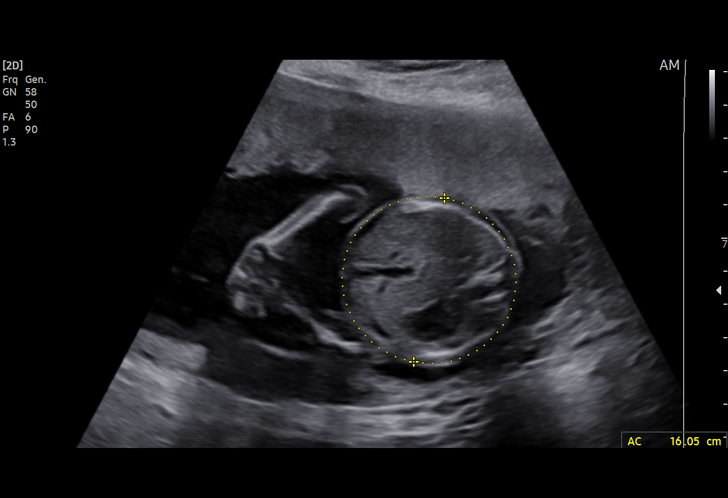
[im 47/71]
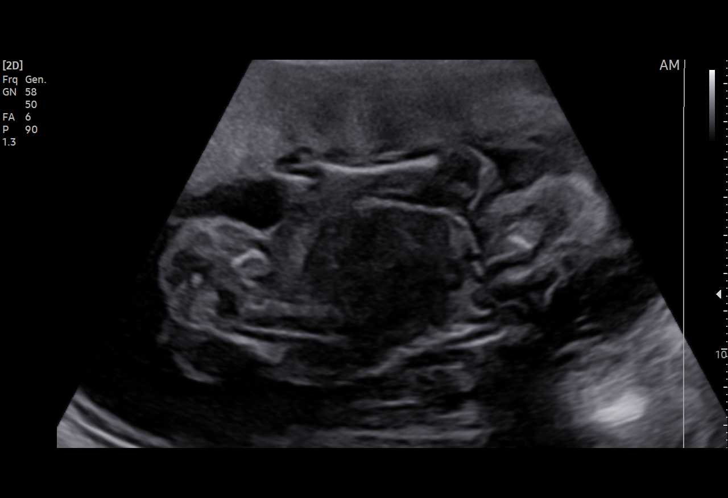
[im 52/71]
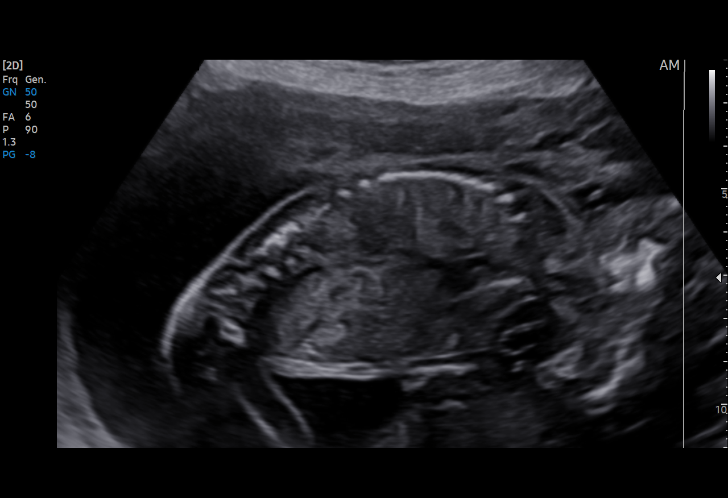
[im 58/71]
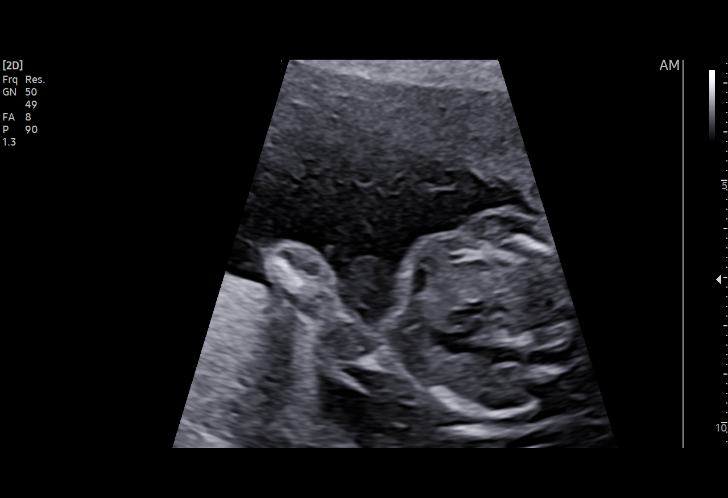
[im 63/71]
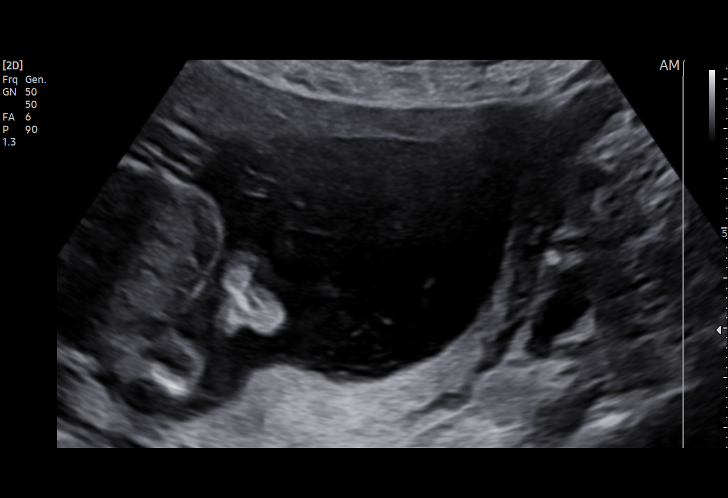
[im 68/71]
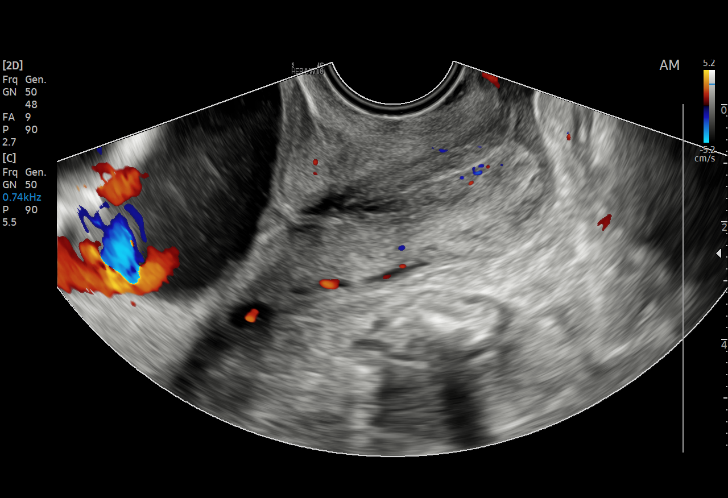

[13 of 28 positions shown; findings below may reference images not displayed]

FRYER CNM

Indications

 Obesity complicating pregnancy, second
 trimester
 Encounter for antenatal screening for
 malformations
 21 weeks gestation of pregnancy
 LR NIPS
 History of cesarean delivery, currently
 pregnant
Fetal Evaluation

 Num Of Fetuses:         1
 Fetal Heart Rate(bpm):  166
 Cardiac Activity:       Observed
 Presentation:           Cephalic
 Placenta:               Anterior
 P. Cord Insertion:      Previously Visualized

 Amniotic Fluid
 AFI FV:      Within normal limits
Biometry

 BPD:      47.9  mm     G. Age:  20w 3d         28  %    CI:        72.17   %    70 - 86
                                                         FL/HC:      18.2   %    15.9 -
 HC:      179.4  mm     G. Age:  20w 3d         16  %    HC/AC:      1.14        1.06 -
 AC:      157.1  mm     G. Age:  20w 6d         38  %    FL/BPD:     68.3   %
 FL:       32.7  mm     G. Age:  20w 1d         17  %    FL/AC:      20.8   %    20 - 24
 CER:      21.5  mm     G. Age:  20w 2d         39  %
 NFT:       5.4  mm

 LV:        6.1  mm
 CM:        3.8  mm

 Est. FW:     361  gm    0 lb 13 oz      23  %
OB History

 Gravidity:    4         Term:   2        Prem:   0        SAB:   1
 TOP:          0       Ectopic:  0        Living: 2
Gestational Age

 LMP:           22w 0d        Date:  07/28/21                 EDD:   05/04/22
 U/S Today:     20w 3d                                        EDD:   05/15/22
 Best:          21w 0d     Det. By:  Previous Ultrasound      EDD:   05/11/22
                                     (10/14/21)
Anatomy

 Cranium:               Appears normal         Aortic Arch:            Not well visualized
 Cavum:                 Appears normal         Ductal Arch:            Not well visualized
 Ventricles:            Appears normal         Diaphragm:              Appears normal
 Choroid Plexus:        Appears normal         Stomach:                Appears normal, left
                                                                       sided
 Cerebellum:            Appears normal         Abdomen:                Appears normal
 Posterior Fossa:       Appears normal         Abdominal Wall:         Appears nml (cord
                                                                       insert, abd wall)
 Nuchal Fold:           Appears normal         Cord Vessels:           Previously seen
 Face:                  Appears normal         Kidneys:                Appear normal
                        (orbits and profile)
 Lips:                  Appears normal         Bladder:                Appears normal
 Thoracic:              Appears normal         Spine:                  Appears normal
 Heart:                 Appears normal         Upper Extremities:      Previously seen
                        (axis, and situs)
 RVOT:                  Appears normal         Lower Extremities:      Previously seen
 LVOT:                  Appears normal

 Other:  Technically difficult due to maternal habitus and early GA. Heels/feet
         and open hands/5th digits previously visualized. Female gender
         previously seen. VC, 3VV and 3VTV visualized.
Cervix Uterus Adnexa

 Cervix
 Length:           3.77  cm.
 Normal appearance by transvaginal scan

 Uterus
 No abnormality visualized.

 Right Ovary
 Not visualized.

 Left Ovary
 Not visualized.
Impression

 Patient returned for completion of fetal anatomy .Amniotic
 fluid is normal and good fetal activity is seen .Fetal biometry
 is consistent with her previously-established dates .Fetal
 anatomical survey was completed (except arches) and
 appears normal.

 Because of her history of cervical tear during delivery, we
 performed transvaginal ultrasound to evaluate the cervix.
 The shortest and best cervical length measurement is 3.7 cm,
 which is normal.  No shortening or funneling was seen on
 transfundal pressure.
 I reassured the patient of the findings.
Recommendations

 -An appointment was made for her to return in 10 weeks for
 fetal growth assessment.
                 Namama, Ibrahim Usman

## 2023-04-30 ENCOUNTER — Other Ambulatory Visit: Payer: Self-pay | Admitting: Obstetrics

## 2023-04-30 DIAGNOSIS — F410 Panic disorder [episodic paroxysmal anxiety] without agoraphobia: Secondary | ICD-10-CM

## 2023-07-04 DIAGNOSIS — M722 Plantar fascial fibromatosis: Secondary | ICD-10-CM | POA: Diagnosis not present

## 2023-08-15 ENCOUNTER — Encounter: Payer: Self-pay | Admitting: Emergency Medicine

## 2023-08-15 ENCOUNTER — Other Ambulatory Visit: Payer: Self-pay

## 2023-08-15 ENCOUNTER — Emergency Department: Payer: BC Managed Care – PPO

## 2023-08-15 ENCOUNTER — Emergency Department
Admission: EM | Admit: 2023-08-15 | Discharge: 2023-08-15 | Disposition: A | Discharge status: Home / Self Care | Payer: BC Managed Care – PPO | Attending: Emergency Medicine | Admitting: Emergency Medicine

## 2023-08-15 DIAGNOSIS — W232XXA Caught, crushed, jammed or pinched between a moving and stationary object, initial encounter: Secondary | ICD-10-CM | POA: Insufficient documentation

## 2023-08-15 DIAGNOSIS — S60011A Contusion of right thumb without damage to nail, initial encounter: Secondary | ICD-10-CM | POA: Diagnosis not present

## 2023-08-15 DIAGNOSIS — M79644 Pain in right finger(s): Secondary | ICD-10-CM | POA: Diagnosis not present

## 2023-08-15 DIAGNOSIS — S6991XA Unspecified injury of right wrist, hand and finger(s), initial encounter: Secondary | ICD-10-CM | POA: Diagnosis not present

## 2023-08-15 MED ORDER — ACETAMINOPHEN 325 MG PO TABS
650.0000 mg | ORAL_TABLET | Freq: Once | ORAL | Status: AC
  Administered 2023-08-15: 650 mg via ORAL
  Filled 2023-08-15: qty 2

## 2023-08-15 NOTE — Discharge Instructions (Signed)
Your x-ray did not reveal any broken bones.  Please follow-up with your outpatient provider.  Please continue to rest, ice, elevate the area.  Please return for any new, worsening, or change in symptoms or other concerns.  It was a pleasure caring for you today.

## 2023-08-15 NOTE — ED Triage Notes (Signed)
Patient to ED via POV for right thumb injury. Patient states she caught thumb in car door and it latched. Redness noted.

## 2023-08-15 NOTE — ED Provider Notes (Signed)
[facility] Provider Note    Event Date/Time   First MD Initiated Contact with Patient 08/15/23 1526     (approximate)   History   Hand Injury   HPI  Emily Payne is a 32 y.o. female who presents today for evaluation of right thumb pain.  Patient reports that she closed her car door on her thumb.  She reports that she had pain immediately and she is worried that she broke her thumb.  She reports that the pain has improved.  She did not have any open wounds.  She is able to flex and extend her thumb.  No numbness or tingling.  No other injury sustained.  Patient Active Problem List   Diagnosis Date Noted   Previous cesarean delivery affecting pregnancy, antepartum 03/02/2022   History of VBAC 03/02/2022   BMI 37.0-37.9, adult 01/23/2020   S/P laparoscopic surgery 06/28/2019   Generalized anxiety disorder with panic attacks 08/31/2018   Depression 02/21/2015   Obesity affecting pregnancy 07/04/2012          Physical Exam   Triage Vital Signs: ED Triage Vitals  Encounter Vitals Group     BP 08/15/23 1352 128/77     Systolic BP Percentile --      Diastolic BP Percentile --      Pulse Rate 08/15/23 1352 88     Resp 08/15/23 1352 18     Temp 08/15/23 1352 97.8 F (36.6 C)     Temp Source 08/15/23 1352 Oral     SpO2 08/15/23 1352 97 %     Weight 08/15/23 1353 270 lb (122.5 kg)     Height 08/15/23 1353 5\' 10"  (1.778 m)     Head Circumference --      Peak Flow --      Pain Score 08/15/23 1357 4     Pain Loc --      Pain Education --      Exclude from Growth Chart --     Most recent vital signs: Vitals:   08/15/23 1352 08/15/23 1353  BP: 128/77   Pulse: 88   Resp: 18   Temp: 97.8 F (36.6 C) 97.8 F (36.6 C)  SpO2: 97%     Physical Exam Vitals and nursing note reviewed.  Constitutional:      General: Awake and alert. No acute distress.    Appearance: Normal appearance. The patient is normal weight.  HENT:     Head:  Normocephalic and atraumatic.     Mouth: Mucous membranes are moist.  Eyes:     General: PERRL. Normal EOMs        Right eye: No discharge.        Left eye: No discharge.     Conjunctiva/sclera: Conjunctivae normal.  Cardiovascular:     Rate and Rhythm: Normal rate and regular rhythm.     Pulses: Normal pulses.  Pulmonary:     Effort: Pulmonary effort is normal. No respiratory distress.     Breath sounds: Normal breath sounds.  Abdominal:     Abdomen is soft. There is no abdominal tenderness. No rebound or guarding. No distention. Musculoskeletal:        General: No swelling. Normal range of motion.     Cervical back: Normal range of motion and neck supple. Right thumb: No open wounds.  Very small subungual hematoma noted along the cuticle measuring approximately 1 to 2 mm.  No felon noted.  No nailbed disruption.  Able to  flex extend at isolated PIP and MCP.  Able to fully abduct and adduct thumb.  Normal grip strength.  Normal radial pulse.  No other injuries noted. Skin:    General: Skin is warm and dry.     Capillary Refill: Capillary refill takes less than 2 seconds.     Findings: No rash.  Neurological:     Mental Status: The patient is awake and alert.      ED Results / Procedures / Treatments   Labs (all labs ordered are listed, but only abnormal results are displayed) Labs Reviewed - No data to display   EKG     RADIOLOGY I independently reviewed and interpreted imaging and agree with radiologists findings.     PROCEDURES:  Critical Care performed:   Procedures   MEDICATIONS ORDERED IN ED: Medications  acetaminophen (TYLENOL) tablet 650 mg (650 mg Oral Given 08/15/23 1600)     IMPRESSION / MDM / ASSESSMENT AND PLAN / ED COURSE  I reviewed the triage vital signs and the nursing notes.   Differential diagnosis includes, but is not limited to, contusion, subungual hematoma, fracture.  Patient is awake and alert, hemodynamically stable and  afebrile.  She is able to abduct and adduct thumb fully against resistance, able to flex and extend at DIP and MCP against resistance.  There is no open wounds.  There is no nailbed disruption.  X-ray obtained is negative for fracture or other osseous injury.  Patient is reassured by these results.  We discussed rest, ice, elevation and symptomatic management.  Patient understands and agrees with plan.  Discharged in stable condition.   Patient's presentation is most consistent with acute complicated illness / injury requiring diagnostic workup.    FINAL CLINICAL IMPRESSION(S) / ED DIAGNOSES   Final diagnoses:  Contusion of right thumb without damage to nail, initial encounter     Rx / DC Orders   ED Discharge Orders     None        Note:  This document was prepared using Dragon voice recognition software and may include unintentional dictation errors.   Keturah Shavers 08/15/23 1620    Willy Eddy, MD 08/15/23 1714

## 2023-08-20 ENCOUNTER — Other Ambulatory Visit: Payer: Self-pay | Admitting: Obstetrics

## 2023-08-20 DIAGNOSIS — F410 Panic disorder [episodic paroxysmal anxiety] without agoraphobia: Secondary | ICD-10-CM

## 2023-09-08 ENCOUNTER — Telehealth: Payer: Self-pay

## 2023-09-08 NOTE — Progress Notes (Signed)
Transition Care Management Unsuccessful Follow-up Telephone Call  Date of discharge and from where:  [place] 11/25  Attempts:  1st Attempt  Reason for unsuccessful TCM follow-up call:  No answer/busy   Lenard Forth [place]  [facility], [facility] Guide, Phone: [phone] Website: [email or web address]

## 2023-09-08 NOTE — Progress Notes (Signed)
Transition Care Management Unsuccessful Follow-up Telephone Call  Date of discharge and from where:  [place] 11/25  Attempts:  2nd Attempt  Reason for unsuccessful TCM follow-up call:  No answer/busy   Emily Payne Emily Payne  [facility], [facility] Guide, Phone: [phone] Website: [email or web address]

## 2023-11-11 ENCOUNTER — Ambulatory Visit (HOSPITAL_COMMUNITY)
Admission: EM | Admit: 2023-11-11 | Discharge: 2023-11-11 | Disposition: A | Discharge status: Home / Self Care | Payer: BC Managed Care – PPO | Attending: Behavioral Health | Admitting: Behavioral Health

## 2023-11-11 ENCOUNTER — Telehealth: Payer: BC Managed Care – PPO | Admitting: Emergency Medicine

## 2023-11-11 DIAGNOSIS — F53 Postpartum depression: Secondary | ICD-10-CM

## 2023-11-11 DIAGNOSIS — F411 Generalized anxiety disorder: Secondary | ICD-10-CM

## 2023-11-11 DIAGNOSIS — F419 Anxiety disorder, unspecified: Secondary | ICD-10-CM

## 2023-11-11 DIAGNOSIS — F410 Panic disorder [episodic paroxysmal anxiety] without agoraphobia: Secondary | ICD-10-CM

## 2023-11-11 MED ORDER — HYDROXYZINE PAMOATE 25 MG PO CAPS: 25.0000 mg | ORAL_CAPSULE | Freq: Three times a day (TID) | ORAL | 0 refills | Status: DC | PRN

## 2023-11-11 MED ORDER — HYDROXYZINE PAMOATE 25 MG PO CAPS: 25.0000 mg | ORAL_CAPSULE | Freq: Three times a day (TID) | ORAL | 0 refills | Status: AC | PRN

## 2023-11-11 MED ORDER — ESCITALOPRAM OXALATE 20 MG PO TABS: 20.0000 mg | ORAL_TABLET | Freq: Every day | ORAL | 4 refills | Status: AC

## 2023-11-11 NOTE — ED Provider Notes (Signed)
 Behavioral Health Urgent Care Medical Screening Exam  Patient Name: Emily Payne MRN: [id number] Date of Evaluation: 11/11/23 Chief Complaint:  Severe Anxiety Diagnosis: GAD GAD  History of Present illness: Emily Payne is a 33 y.o.Caucasian female with past psychiatric history significant for MDD and GAD, who presents unaccompanied to [facility] behavioral health urgent care complaining of severe generalized anxiety disorder and running out of her anxiety medications x 2 months.  Patient was seen and examined face-to-face sitting up in a chair in the assessment room.  Assessment findings shared with the treatment team and also the attending psychiatrist.  On assessment today, patient reports symptoms of generalized anxiety disorder to include increased excessive worry/fear, feeling tense, restless, or on edge, difficulty concentrating or making decisions, irritability, feeling overwhelmed or out of control, rapid heartbeat, sweating and difficulty concentrating.  Patient reports that her outpatient provider recently moved from her city and to get her medication refill from another provider will take about 3 weeks.  Reports she has been out of her medication for the past 2 months.  Added that her stressors include being a mother of 4 children ages 16, 21, 43 and 54-year-old; going through a divorce, having her house under foreclosure.  Report this is our first time coming to [facility] behavioral health urgent care for help.  Reports she has been on Lexapro which really helped with her depression and anxiety.  She reports anxiety of 8/10, and depression of 8/10, with 10 being high severity.  Patient reports sleeping over 8 hours last night and appetite is fair.  She denies SI/HI/AVH.  Further denies history of suicide attempts or self-mutilation.  She lives with her 4 children and employed as a Customer service manager.  Objective: Patient seen and examined in the assessment room sitting up in a  chair.  Mood appears sad and tearful with congruent affect.  Speech is clear and coherent with normal volume and pattern.  Eye contact with this provider is fair during the assessment.  Patient able to answer assessment questions appropriately.  Objectively not responding to internal or external stimuli.  Vital signs reviewed with elevated pulse of 100 and blood pressure of 155/108.  Lexapro tablets 20 mg p.o. daily for depression and hydroxyzine tablet 25 mg p.o. 3 times daily as needed ordered for anxiety, and patient to follow up with outpatient provider.  Outpatient resources for therapy provided to patient.  Education provided on risks and benefits of the medications ordered.  Patient verbalized understanding of the instructions provided and made this provider aware that she will follow up with the outpatient resources provided.  Flowsheet Row ED from 11/11/2023 in [facility] ED from 08/15/2023 in [facility] Emergency Department at [facility] Admission (Discharged) from 05/04/2022 in [facility]  C-SSRS RISK CATEGORY No Risk No Risk No Risk      Psychiatric Specialty Exam  Presentation  General Appearance:Appropriate for Environment; Casual  Eye Contact:Good  Speech:Clear and Coherent  Speech Volume:Normal  Handedness:Right  Mood and Affect  Mood: Anxious; Depressed  Affect: Appropriate; Congruent  Thought Process  Thought Processes: Coherent  Descriptions of Associations:Intact  Orientation:Full (Time, Place and Person)  Thought Content:Logical    Hallucinations:None  Ideas of Reference:None  Suicidal Thoughts:No  Homicidal Thoughts:No  Sensorium  Memory:No data recorded Judgment: Good  Insight: Good  Executive Functions  Concentration: Good  Attention Span: Good  Recall: Good  Fund of Knowledge:No data recorded Language: Good  Psychomotor Activity  Psychomotor  Activity: Normal  Assets  Assets: Communication Skills; Desire for Improvement; Housing; Physical Health  Sleep  Sleep: Good  Number of hours:  9  Physical Exam: Physical Exam Vitals and nursing note reviewed.  Constitutional:      Appearance: Normal appearance. She is obese.  HENT:     Head: Normocephalic.     Right Ear: External ear normal.     Left Ear: External ear normal.     Nose: Nose normal.     Mouth/Throat:     Mouth: Mucous membranes are moist.     Pharynx: Oropharynx is clear.  Eyes:     Extraocular Movements: Extraocular movements intact.  Cardiovascular:     Rate and Rhythm: Normal rate.     Pulses: Normal pulses.  Pulmonary:     Effort: Pulmonary effort is normal.  Abdominal:     Comments: Deferred  Genitourinary:    Comments: Deferred Musculoskeletal:        General: Normal range of motion.     Cervical back: Normal range of motion.  Skin:    General: Skin is warm.  Neurological:     General: No focal deficit present.     Mental Status: She is alert and oriented to person, place, and time.   Review of Systems  Constitutional:  Negative for chills and fever.  HENT:  Negative for sore throat.   Eyes:  Negative for blurred vision.  Respiratory:  Negative for cough, sputum production, shortness of breath and wheezing.   Cardiovascular:  Negative for chest pain and palpitations.  Gastrointestinal:  Negative for abdominal pain, constipation, diarrhea, heartburn, nausea and vomiting.  Genitourinary:  Negative for dysuria, frequency and urgency.  Musculoskeletal:  Negative for myalgias and neck pain.  Skin:  Negative for itching and rash.  Neurological:  Negative for dizziness, tingling, tremors and headaches.  Endo/Heme/Allergies:        See allergy listing  Psychiatric/Behavioral:  Positive for depression. Negative for hallucinations, substance abuse and suicidal ideas. The patient is nervous/anxious.    Blood pressure (!) 155/108, pulse 100,  resp. rate 20, SpO2 100%, currently breastfeeding. There is no height or weight on file to calculate BMI.  Musculoskeletal: Strength & Muscle Tone: within normal limits Gait & Station: normal Patient leans: N/A   [facility] MSE Discharge Disposition for Follow up and Recommendations: Based on my evaluation the patient does not appear to have an emergency medical condition and can be discharged with resources and follow up care in outpatient services for Medication Management and Individual Therapy  Adult Mental Health Resources  [facility] Address: [address], [place], [place] [address] Phone: [phone]  [facility], [facility] Address: [address], [place], [place] [address] Hours:  Closed ? Opens 8?AM Mon Phone: [phone]  [facility] Address: [address], [place], [place] [address] Phone: [phone]  [facility], [facility] Address: [address], [place], [place] [address] Phone: [phone]  [facility] Address: [place] [facility], [address], [place], [place] [address] Hours:  Closed ? Opens 8?AM Mon Phone: [phone]  [facility] Address: [address] [employer], [place], [place] [address] Phone: [phone] INTAKE: [phone] Ext [phone] Counseling Group Address: [address] [place], [place] [address] Phone: [phone]  [facility] Address: [address], [place], [place] [address] Phone: [phone]  [facility] Address: [id number], [place], [place] [address] Phone: [phone]  Cecilie Lowers, FNP 11/11/2023, 5:40 PM

## 2023-11-11 NOTE — Patient Instructions (Signed)
 [name], thank you for joining Roxy Horseman, PA-C for today's virtual visit.  While this provider is not your primary care provider (PCP), if your PCP is located in our provider database this encounter information will be shared with them immediately following your visit.      I recommend follow up with your PCP or you could consider seeking treatment at: [email or web address]   Phone: [phone]  Address: [address] [place], [place] [address]  Hours: Open 24/7, No appointment required.  A [facility] MyChart account gives you access to today's visit and all your visits, tests, and labs performed at [facility] " click here if you don't have a [facility] MyChart account or go to [email or web address]       Consent: (Patient) Beya Tipps provided verbal consent for this virtual visit at the beginning of the encounter.  Current Medications:  Current Outpatient Medications:    hydrOXYzine (VISTARIL) 25 MG capsule, Take 1 capsule (25 mg total) by mouth every 8 (eight) hours as needed., Disp: 30 capsule, Rfl: 0   acetaminophen (TYLENOL) 325 MG tablet, Take 650 mg by mouth every 6 (six) hours as needed., Disp: , Rfl:    albuterol (VENTOLIN HFA) 108 (90 Base) MCG/ACT inhaler, Inhale 2 puffs into the lungs every 6 (six) hours as needed for wheezing or shortness of breath., Disp: , Rfl:    escitalopram (LEXAPRO) 20 MG tablet, Take 1 tablet (20 mg total) by mouth daily., Disp: 90 tablet, Rfl: 4   famotidine (PEPCID) 10 MG tablet, Take 10 mg by mouth 2 (two) times daily., Disp: , Rfl:    ibuprofen (ADVIL) 600 MG tablet, Take 1 tablet (600 mg total) by mouth every 6 (six) hours., Disp: 30 tablet, Rfl: 0   LORazepam (ATIVAN) 0.5 MG tablet, Take 1 tablet (0.5 mg total) by mouth every 8 (eight) hours., Disp: 30 tablet, Rfl: 0   Prenatal Vit-Fe Fumarate-FA (PRENATAL MULTIVITAMIN) TABS tablet, Take 1  tablet by mouth daily at 12 noon., Disp: , Rfl:    traMADol (ULTRAM) 50 MG tablet, Take 1-2 tablets (50-100 mg total) by mouth every 6 (six) hours as needed for moderate pain., Disp: 30 tablet, Rfl: 0   Medications ordered in this encounter:  Meds ordered this encounter  Medications   hydrOXYzine (VISTARIL) 25 MG capsule    Sig: Take 1 capsule (25 mg total) by mouth every 8 (eight) hours as needed.    Dispense:  30 capsule    Refill:  0    Supervising Provider:   Merrilee Jansky [[id number]]     *If you need refills on other medications prior to your next appointment, please contact your pharmacy*  Follow-Up: Call back or seek an in-person evaluation if the symptoms worsen or if the condition fails to improve as anticipated.  [facility] [phone]  Other Instructions    If you have been instructed to have an in-person evaluation today at a local Urgent Care facility, please use the link below. It will take you to a list of all of our available [facility], including address, phone number and hours of operation. Please do not delay care.  [facility]  If you or a family member do not have a primary care provider, use the link below to schedule a visit and establish care. When you choose a [facility] primary care physician or advanced practice provider, you gain a long-term partner in health. Find a Primary Care Provider  Learn more about [facility]'s in-office and virtual care options: [facility] - Get Care Now
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
## 2023-11-11 NOTE — Discharge Instructions (Addendum)
 Adult Mental Health Resources  [facility] Address: [address] [place], [place] [address] Phone: [phone]  [facility], [facility] Address: [address] [place], [place] [address] Hours:  Closed ? Opens 8?AM Mon Phone: [phone]  [facility] Address: [address], [place], [place] [address] Phone: [phone]  [facility], [facility] Address: [address] [place], [place] [address] Phone: [phone]  [facility] Address: [place] [facility], [address] [place], [place] [address] Hours:  Closed ? Opens 8?AM Mon Phone: [phone]  [facility] Address: [address] [place], [place], [place] [address] Phone: [phone] INTAKE: [phone] Ext [phone] [facility] Address: [address], [place], [place] [address] Phone: [phone]  [facility] Address: [address], [place], [place] [address] Phone: [phone]  [facility] Address: [address], [place], [place] [address] Phone: [phone] .[facility]  [facility] Address: [address] [place], [place] [address] Phone: [phone]  [facility], [facility] Address: [address], [place], [place] [address] Hours:  Closed ? Opens 8?AM Mon Phone: [phone]  [facility] Address: [address], [place], [place] [address] Phone: [phone]  [facility], [facility] Address: [address] [place], [place] [address] Phone: [phone]  [facility] Address: [place] [facility], [address], [place], [place] [address] Hours:  Closed ? Opens 8?AM Mon Phone: [phone]  [facility] Address: [address] [place], [place], [place] [address] Phone: [phone] INTAKE: [phone] Ext [phone]  [facility] Address: [address], [place], [place] [address] Phone: [phone]  [facility] Address: [address], [place], [place] [address] Phone: [phone]  [facility] Address: [address], [place], [place] [address] Phone: [phone]  [facility] Address: [address] [place], [place] [address] Phone: [phone]  [facility] Phone: [phone]   [facility]  Address: [address] [place] [place], [place] [address] Hours: Open 24 hours Phone: [phone] Address: [address] [place], [place], [place] [address] Phone: [phone]  [facility] Phone: [phone]   [facility]  Address: [address] [place], [place] [address] Hours: Open 24 hours Phone: [phone]

## 2023-11-11 NOTE — Progress Notes (Signed)
 Virtual Visit Consent   Emily Payne, you are scheduled for a virtual visit with a [facility] provider today. Just as with appointments in the office, your consent must be obtained to participate. Your consent will be active for this visit and any virtual visit you may have with one of our providers in the next 365 days. If you have a MyChart account, a copy of this consent can be sent to you electronically.  As this is a virtual visit, video technology does not allow for your provider to perform a traditional examination. This may limit your provider's ability to fully assess your condition. If your provider identifies any concerns that need to be evaluated in person or the need to arrange testing (such as labs, EKG, etc.), we will make arrangements to do so. Although advances in technology are sophisticated, we cannot ensure that it will always work on either your end or our end. If the connection with a video visit is poor, the visit may have to be switched to a telephone visit. With either a video or telephone visit, we are not always able to ensure that we have a secure connection.  By engaging in this virtual visit, you consent to the provision of healthcare and authorize for your insurance to be billed (if applicable) for the services provided during this visit. Depending on your insurance coverage, you may receive a charge related to this service.  I need to obtain your verbal consent now. Are you willing to proceed with your visit today? Emily Payne has provided verbal consent on 11/11/2023 for a virtual visit (video or telephone). Roxy Horseman, PA-C  Date: 11/11/2023 10:45 AM   Virtual Visit via Video Note   I, Roxy Horseman, connected with  Emily Payne  ([id number], [birth year]) on 11/11/23 at 10:45 AM EST by a video-enabled telemedicine application and verified that I am speaking with the correct person using two identifiers.  Location: Patient: Virtual Visit Location Patient:  Home Provider: Virtual Visit Location Provider: Home Office   I discussed the limitations of evaluation and management by telemedicine and the availability of in person appointments. The patient expressed understanding and agreed to proceed.    History of Present Illness: Emily Payne is a 33 y.o. who identifies as a female who was assigned female at birth, and is being seen today for anxiety.  States that she takes Lexapro.  States that her prescription has run out a couple of months ago.  States that her doctor has left her practice and she can't see a new one for several more weeks.  States that she is having anxiety and panic attacks.  Requests rx refill.  HPI: HPI  Problems:  Patient Active Problem List   Diagnosis Date Noted   Previous cesarean delivery affecting pregnancy, antepartum 03/02/2022   History of VBAC 03/02/2022   BMI 37.0-37.9, adult 01/23/2020   S/P laparoscopic surgery 06/28/2019   Generalized anxiety disorder with panic attacks 08/31/2018   Depression 02/21/2015   Obesity affecting pregnancy 07/04/2012    Allergies:  Allergies  Allergen Reactions   Sulfa Drugs Cross Reactors Anaphylaxis, Itching and Other (See Comments)    Flu like symptoms   Medications:  Current Outpatient Medications:    hydrOXYzine (VISTARIL) 25 MG capsule, Take 1 capsule (25 mg total) by mouth every 8 (eight) hours as needed., Disp: 30 capsule, Rfl: 0   acetaminophen (TYLENOL) 325 MG tablet, Take 650 mg by mouth every 6 (six) hours as needed., Disp: , Rfl:  albuterol (VENTOLIN HFA) 108 (90 Base) MCG/ACT inhaler, Inhale 2 puffs into the lungs every 6 (six) hours as needed for wheezing or shortness of breath., Disp: , Rfl:    escitalopram (LEXAPRO) 20 MG tablet, Take 1 tablet (20 mg total) by mouth daily., Disp: 90 tablet, Rfl: 4   famotidine (PEPCID) 10 MG tablet, Take 10 mg by mouth 2 (two) times daily., Disp: , Rfl:    ibuprofen (ADVIL) 600 MG tablet, Take 1 tablet (600 mg total) by  mouth every 6 (six) hours., Disp: 30 tablet, Rfl: 0   LORazepam (ATIVAN) 0.5 MG tablet, Take 1 tablet (0.5 mg total) by mouth every 8 (eight) hours., Disp: 30 tablet, Rfl: 0   Prenatal Vit-Fe Fumarate-FA (PRENATAL MULTIVITAMIN) TABS tablet, Take 1 tablet by mouth daily at 12 noon., Disp: , Rfl:    traMADol (ULTRAM) 50 MG tablet, Take 1-2 tablets (50-100 mg total) by mouth every 6 (six) hours as needed for moderate pain., Disp: 30 tablet, Rfl: 0  Observations/Objective: Patient is well-developed, well-nourished in no acute distress.  Resting comfortably  at home.  Head is normocephalic, atraumatic.  No labored breathing.  Speech is clear and coherent with logical content.  Patient is alert and oriented at baseline.    Assessment and Plan: 1. Anxiety (Primary)   Discussed that medication refills of this type, especially after having been off it for months, would need to be done by a PCP.  I did offer her a short rx of hydroxyzine to help take the edge off.  Discussed other options, such as [facility] and [facility].  Follow Up Instructions: I discussed the assessment and treatment plan with the patient. The patient was provided an opportunity to ask questions and all were answered. The patient agreed with the plan and demonstrated an understanding of the instructions.  A copy of instructions were sent to the patient via MyChart unless otherwise noted below.     The patient was advised to call back or seek an in-person evaluation if the symptoms worsen or if the condition fails to improve as anticipated.    Roxy Horseman, PA-C

## 2024-03-21 DIAGNOSIS — M542 Cervicalgia: Secondary | ICD-10-CM | POA: Diagnosis not present

## 2024-03-21 DIAGNOSIS — M9902 Segmental and somatic dysfunction of thoracic region: Secondary | ICD-10-CM | POA: Diagnosis not present

## 2024-03-21 DIAGNOSIS — M9901 Segmental and somatic dysfunction of cervical region: Secondary | ICD-10-CM | POA: Diagnosis not present

## 2024-03-21 DIAGNOSIS — M62838 Other muscle spasm: Secondary | ICD-10-CM | POA: Diagnosis not present

## 2024-03-22 DIAGNOSIS — M9902 Segmental and somatic dysfunction of thoracic region: Secondary | ICD-10-CM | POA: Diagnosis not present

## 2024-03-22 DIAGNOSIS — M62838 Other muscle spasm: Secondary | ICD-10-CM | POA: Diagnosis not present

## 2024-03-22 DIAGNOSIS — M9901 Segmental and somatic dysfunction of cervical region: Secondary | ICD-10-CM | POA: Diagnosis not present

## 2024-03-22 DIAGNOSIS — M542 Cervicalgia: Secondary | ICD-10-CM | POA: Diagnosis not present

## 2024-03-28 ENCOUNTER — Emergency Department
Admission: EM | Admit: 2024-03-28 | Discharge: 2024-03-28 | Disposition: A | Discharge status: Home / Self Care | Attending: Emergency Medicine | Admitting: Emergency Medicine

## 2024-03-28 ENCOUNTER — Emergency Department

## 2024-03-28 ENCOUNTER — Other Ambulatory Visit: Payer: Self-pay

## 2024-03-28 ENCOUNTER — Encounter: Payer: Self-pay | Admitting: Emergency Medicine

## 2024-03-28 DIAGNOSIS — S0012XA Contusion of left eyelid and periocular area, initial encounter: Secondary | ICD-10-CM | POA: Diagnosis not present

## 2024-03-28 DIAGNOSIS — R220 Localized swelling, mass and lump, head: Secondary | ICD-10-CM | POA: Diagnosis not present

## 2024-03-28 DIAGNOSIS — S0011XA Contusion of right eyelid and periocular area, initial encounter: Secondary | ICD-10-CM | POA: Diagnosis not present

## 2024-03-28 DIAGNOSIS — S99912A Unspecified injury of left ankle, initial encounter: Secondary | ICD-10-CM | POA: Insufficient documentation

## 2024-03-28 DIAGNOSIS — S022XXA Fracture of nasal bones, initial encounter for closed fracture: Secondary | ICD-10-CM | POA: Insufficient documentation

## 2024-03-28 DIAGNOSIS — S0083XA Contusion of other part of head, initial encounter: Secondary | ICD-10-CM | POA: Diagnosis not present

## 2024-03-28 DIAGNOSIS — S0992XA Unspecified injury of nose, initial encounter: Secondary | ICD-10-CM | POA: Diagnosis not present

## 2024-03-28 DIAGNOSIS — Z0489 Encounter for examination and observation for other specified reasons: Secondary | ICD-10-CM | POA: Diagnosis not present

## 2024-03-28 MED ORDER — IBUPROFEN 600 MG PO TABS
600.0000 mg | ORAL_TABLET | Freq: Once | ORAL | Status: AC
  Administered 2024-03-28: 600 mg via ORAL
  Filled 2024-03-28: qty 1

## 2024-03-28 MED ORDER — ACETAMINOPHEN 500 MG PO TABS
1000.0000 mg | ORAL_TABLET | Freq: Once | ORAL | Status: AC
  Administered 2024-03-28: 1000 mg via ORAL
  Filled 2024-03-28: qty 2
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
## 2024-03-28 NOTE — ED Notes (Signed)
 Pt speaking with [place] [employer] at this time via telephone.
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
## 2024-03-28 NOTE — Discharge Instructions (Addendum)
 Take acetaminophen  650 mg and ibuprofen  400 mg every 6 hours for pain.  Take with food.  Use ice and elevate your ankle when not use which both can help with pain and swelling.  For your nasal bone fracture if you have difficulty breathing through your nose or ongoing pain or questions about the appearance of the healing, call Dr. Herminio who is an ear nose and throat specialist.  For your ankle injury you should have improving pain and swelling over the next few days.  If in 5 to 7 days you have significant amount of pain you can call Dr. Aixa who is a foot and ankle specialist for further evaluation.  Thank you for choosing us  for your health care today!  Please see your primary doctor this week for a follow up appointment.   If you have any new, worsening, or unexpected symptoms call your doctor right away or come back to the emergency department for reevaluation.  It was my pleasure to care for you today.   Ginnie EDISON Cyrena, MD
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
## 2024-09-03 ENCOUNTER — Ambulatory Visit: Admitting: Internal Medicine
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
## 2024-10-19 NOTE — Progress Notes (Unsigned)
 "   GYNECOLOGY ANNUAL PHYSICAL EXAM PROGRESS NOTE  Subjective:    Emily Payne is a 34 y.o. [phone] female who presents for an annual exam.  The patient {is/is not/has never been:13135} sexually active. The patient participates in regular exercise: {yes/no/not asked:9010}. Has the patient ever been transfused or tattooed?: {yes/no/not asked:9010}. The patient reports that there {is/is not:9024} domestic violence in her life.   The patient has the following complaints today:   Menstrual History: Menarche age: *** No LMP recorded.     Gynecologic History:  Contraception: {method:5051} History of STI's:  Last Pap: ***. Results were: {norm/abn:16337}.  ***Denies/Notes h/o abnormal pap smears. Last mammogram: ***. Results were: {norm/abn:16337}       OB History  Gravida Para Term Preterm AB Living  4 3 3  0 1 3  SAB IAB Ectopic Multiple Live Births  0 0 1 0 3    # Outcome Date GA Lbr Len/2nd Weight Sex Type Anes PTL Lv  4 Term 05/04/22 [redacted]w[redacted]d  7 lb 6.2 oz (3.35 kg) F CS-LTranv Spinal  LIV     Name: Lamphier,GIRL Zuley     Apgar1: 8  Apgar5: 9  3 Term 06/09/20 [redacted]w[redacted]d 15:21 / 00:49 8 lb 6.4 oz (3.81 kg) F Vag-Spont EPI  LIV     Name: Matkins,GIRL Lamaya     Apgar1: 6  Apgar5: 7  2 Term 10/01/14 [redacted]w[redacted]d  6 lb 13.4 oz (3.1 kg) M CS-LTranv Spinal  LIV     Apgar1: 9  Apgar5: 9  1 Ectopic             Past Medical History:  Diagnosis Date   [redacted] weeks gestation of pregnancy    Anxiety    Bipolar disorder (HCC)    no med currently   Depression    Ectopic pregnancy    Hypoglycemic syndrome    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 with aura    Poor fetal growth affecting management of mother in third trimester    Rh negative state in antepartum period 03/09/2014   Needs rhogam at 28 weeks   Single liveborn infant delivered vaginally 06/09/2020   Supervision of high risk pregnancy in first trimester 11/14/2019    Nursing Staff Provider  Office Location  [facility]  Dating   LMP=7wk US   Language  English Anatomy US   Incomplete, suboptimal scan with MFM, close exam after birth advised  Flu Vaccine   Genetic Screen  NIPS:normal XX  TDaP vaccine   given 7/16 Hgb A1C or  GTT Early :102 Third trimester :   Rhogam  03/07/20   LAB RESULTS   Feeding Plan  breast Blood Type A/Negative/-- (02/24 1026)   Contraception  A   VBAC, delivered, current hospitalization 06/09/2020    Past Surgical History:  Procedure Laterality Date   abscess tooth     CESAREAN SECTION N/A 10/01/2014   Procedure: PRIMARY CESAREAN SECTION;  Surgeon: Lynwood KANDICE Solomons, MD;  Location: [facility];  Service: Obstetrics;  Laterality: N/A;   CESAREAN SECTION N/A 05/04/2022   Procedure: CESAREAN SECTION;  Surgeon: Connell Davies, MD;  Location: [facility];  Service: Obstetrics;  Laterality: N/A;   DIAGNOSTIC LAPAROSCOPY WITH REMOVAL OF ECTOPIC PREGNANCY N/A 06/27/2019   Procedure: DIAGNOSTIC LAPAROSCOPY , evacuation or hemoperitoneum;  Surgeon: Victor Claudell SAUNDERS, MD;  Location: [facility];  Service: Gynecology;  Laterality: N/A;   LAPAROSCOPIC UNILATERAL SALPINGECTOMY Right 06/27/2019   Procedure: LAPAROSCOPIC UNILATERAL SALPINGECTOMY;  Surgeon: Victor,  Claudell SAUNDERS, MD;  Location: [facility];  Service: Gynecology;  Laterality: Right;   TONSILLECTOMY     WISDOM TOOTH EXTRACTION      Family History  Problem Relation Age of Onset   Ovarian cysts Mother    Stroke Maternal Grandmother    Cancer Maternal Grandmother 27       uterine cancer   Lung cancer Paternal Grandfather    Diabetes Maternal Great-grandmother    Lung cancer Maternal Great-grandmother     Social History   Socioeconomic History   Marital status: Married    Spouse name: Elspeth   Number of children: 4   Years of education: 17   Highest education level: Bachelor's degree (e.g., BA, AB, BS)  Occupational History   Occupation: Self Employed  Tobacco Use   Smoking status: Never   Smokeless tobacco: Never  Vaping Use   Vaping status:  Never Used  Substance and Sexual Activity   Alcohol use: Not Currently    Alcohol/week: 0.0 standard drinks of alcohol    Comment: rare but none with pregnancy   Drug use: No   Sexual activity: Yes    Partners: Male    Birth control/protection: None  Other Topics Concern   Not on file  Social History Narrative   Has two step children- ages 53 and 53 that permanently stays with her. Patient also has a biological 79 yr old son. 34 month old . (12/01/21)   Social Drivers of Health   Tobacco Use: Low Risk (03/28/2024)   Patient History    Smoking Tobacco Use: Never    Smokeless Tobacco Use: Never    Passive Exposure: Not on file  Financial Resource Strain: Not on file  Food Insecurity: Not on file  Transportation Needs: Not on file  Physical Activity: Not on file  Stress: Not on file  Social Connections: Not on file  Intimate Partner Violence: Not on file  Depression (PHQ2-9): Not on file  Alcohol Screen: Not on file  Housing: Not on file  Utilities: Not on file  Health Literacy: Not on file    Medications Ordered Prior to Encounter[1]  Allergies[2]   Review of Systems Constitutional: negative for chills, fatigue, fevers and sweats Eyes: negative for irritation, redness and visual disturbance Ears, nose, mouth, throat, and face: negative for hearing loss, nasal congestion, snoring and tinnitus Respiratory: negative for asthma, cough, sputum Cardiovascular: negative for chest pain, dyspnea, exertional chest pressure/discomfort, irregular heart beat, palpitations and syncope Gastrointestinal: negative for abdominal pain, change in bowel habits, nausea and vomiting Genitourinary: negative for abnormal menstrual periods, genital lesions, sexual problems and vaginal discharge, dysuria and urinary incontinence Integument/breast: negative for breast lump, breast tenderness and nipple discharge Hematologic/lymphatic: negative for bleeding and easy bruising Musculoskeletal:negative  for back pain and muscle weakness Neurological: negative for dizziness, headaches, vertigo and weakness Endocrine: negative for diabetic symptoms including polydipsia, polyuria and skin dryness Allergic/Immunologic: negative for hay fever and urticaria      Objective:  currently breastfeeding. There is no height or weight on file to calculate BMI.    General Appearance:    Alert, cooperative, no distress, appears stated age  Head:    Normocephalic, without obvious abnormality, atraumatic  Eyes:    PERRL, conjunctiva/corneas clear, EOM's intact, both eyes  Ears:    Normal external ear canals, both ears  Nose:   Nares normal, septum midline, mucosa normal, no drainage or sinus tenderness  Throat:   Lips, mucosa, and tongue normal; teeth and gums  normal  Neck:   Supple, symmetrical, trachea midline, no adenopathy; thyroid: no enlargement/tenderness/nodules; no carotid bruit or JVD  Back:     Symmetric, no curvature, ROM normal, no CVA tenderness  Lungs:     Clear to auscultation bilaterally, respirations unlabored  Chest Wall:    No tenderness or deformity   Heart:    Regular rate and rhythm, S1 and S2 normal, no murmur, rub or gallop  Breast Exam:    No tenderness, masses, or nipple abnormality  Abdomen:     Soft, non-tender, bowel sounds active all four quadrants, no masses, no organomegaly.    Genitalia:    Pelvic:external genitalia normal, vagina without lesions, discharge, or tenderness, rectovaginal septum  normal. Cervix normal in appearance, no cervical motion tenderness, no adnexal masses or tenderness.  Uterus normal size, shape, mobile, regular contours, nontender.  Rectal:    Normal external sphincter.  No hemorrhoids appreciated. Internal exam not done.   Extremities:   Extremities normal, atraumatic, no cyanosis or edema  Pulses:   2+ and symmetric all extremities  Skin:   Skin color, texture, turgor normal, no rashes or lesions  Lymph nodes:   Cervical, supraclavicular, and  axillary nodes normal  Neurologic:   CNII-XII intact, normal strength, sensation and reflexes throughout   .  Labs:  Lab Results  Component Value Date   WBC 12.0 (H) 05/05/2022   HGB 11.6 (L) 05/05/2022   HCT 33.9 (L) 05/05/2022   MCV 90.6 05/05/2022   PLT 189 05/05/2022    Lab Results  Component Value Date   CREATININE 0.72 06/09/2020   BUN 9 06/09/2020   NA 132 (L) 06/09/2020   K 3.8 06/09/2020   CL 102 06/09/2020   CO2 20 (L) 06/09/2020    Lab Results  Component Value Date   ALT 13 06/09/2020   AST 29 06/09/2020   ALKPHOS 125 06/09/2020   BILITOT 0.8 06/09/2020    No results found for: TSH   Assessment:   1. Cervical cancer screening   2. Screen for STD (sexually transmitted disease)   3. Encounter for well woman exam with routine gynecological exam      Plan:  Blood tests: {blood tests:13147}. Breast self exam technique reviewed and patient encouraged to perform self-exam monthly. Contraception: {contraceptive methods:5051}. Discussed healthy lifestyle modifications. Mammogram {discussed/ordered:14545} Pap smear {discussed/ordered:14545}. Flu vaccine: Follow up in 1 year for annual exam   Kizzie Camelia CROME, CMA [facility]    [1]  Current Outpatient Medications on File Prior to Visit  Medication Sig Dispense Refill   escitalopram  (LEXAPRO ) 20 MG tablet Take 1 tablet (20 mg total) by mouth daily. 90 tablet 4   hydrOXYzine  (VISTARIL ) 25 MG capsule Take 1 capsule (25 mg total) by mouth every 8 (eight) hours as needed. 30 capsule 0   No current facility-administered medications on file prior to visit.  [2]  Allergies Allergen Reactions   Sulfa Drugs Cross Reactors Anaphylaxis, Itching and Other (See Comments)    Flu like symptoms   "

## 2024-10-19 NOTE — Patient Instructions (Signed)
 Preventive Care 1-34 Years Old, Female  Preventive care refers to lifestyle choices and visits with your health care provider that can promote health and wellness. Preventive care visits are also called wellness exams. What can I expect for my preventive care visit? Counseling During your preventive care visit, your health care provider may ask about your: Medical history, including: Past medical problems. Family medical history. Pregnancy history. Current health, including: Menstrual cycle. Method of birth control. Emotional well-being. Home life and relationship well-being. Sexual activity and sexual health. Lifestyle, including: Alcohol, nicotine or tobacco, and drug use. Access to firearms. Diet, exercise, and sleep habits. Work and work astronomer. Sunscreen use. Safety issues such as seatbelt and bike helmet use. Physical exam Your health care provider may check your: Height and weight. These may be used to calculate your BMI (body mass index). BMI is a measurement that tells if you are at a healthy weight. Waist circumference. This measures the distance around your waistline. This measurement also tells if you are at a healthy weight and may help predict your risk of certain diseases, such as type 2 diabetes and high blood pressure. Heart rate and blood pressure. Body temperature. Skin for abnormal spots. What immunizations do I need?  Vaccines are usually given at various ages, according to a schedule. Your health care provider will recommend vaccines for you based on your age, medical history, and lifestyle or other factors, such as travel or where you work. What tests do I need? Screening Your health care provider may recommend screening tests for certain conditions. This may include: Pelvic exam and Pap test. Lipid and cholesterol levels. Diabetes screening. This is done by checking your blood sugar (glucose) after you have not eaten for a while  (fasting). Hepatitis B test. Hepatitis C test. HIV (human immunodeficiency virus) test. STI (sexually transmitted infection) testing, if you are at risk. BRCA-related cancer screening. This may be done if you have a family history of breast, ovarian, tubal, or peritoneal cancers. Talk with your health care provider about your test results, treatment options, and if necessary, the need for more tests. Follow these instructions at home: Eating and drinking  Eat a healthy diet that includes fresh fruits and vegetables, whole grains, lean protein, and low-fat dairy products. Take vitamin and mineral supplements as recommended by your health care provider. Do not drink alcohol if: Your health care provider tells you not to drink. You are pregnant, may be pregnant, or are planning to become pregnant. If you drink alcohol: Limit how much you have to 0-1 drink a day. Know how much alcohol is in your drink. In the [place] one drink equals one 12 oz bottle of beer (355 mL), one 5 oz glass of wine (148 mL), or one 1 oz glass of hard liquor (44 mL). Lifestyle Brush your teeth every morning and night with fluoride toothpaste. Floss one time each day. Exercise for at least 30 minutes 5 or more days each week. Do not use any products that contain nicotine or tobacco. These products include cigarettes, chewing tobacco, and vaping devices, such as e-cigarettes. If you need help quitting, ask your health care provider. Do not use drugs. If you are sexually active, practice safe sex. Use a condom or other form of protection to prevent STIs. If you do not wish to become pregnant, use a form of birth control. If you plan to become pregnant, see your health care provider for a prepregnancy visit. Find healthy ways to manage stress, such as:  Meditation, yoga, or listening to music. Journaling. Talking to a trusted person. Spending time with friends and family. Minimize exposure to UV radiation to reduce your  risk of skin cancer. Safety Always wear your seat belt while driving or riding in a vehicle. Do not drive: If you have been drinking alcohol. Do not ride with someone who has been drinking. If you have been using any mind-altering substances or drugs. While texting. When you are tired or distracted. Wear a helmet and other protective equipment during sports activities. If you have firearms in your house, make sure you follow all gun safety procedures. Seek help if you have been physically or sexually abused. What's next? Go to your health care provider once a year for an annual wellness visit. Ask your health care provider how often you should have your eyes and teeth checked. Stay up to date on all vaccines. This information is not intended to replace advice given to you by your health care provider. Make sure you discuss any questions you have with your health care provider. How to Do a Breast Self-Exam Doing breast self-exams can help you stay healthy. They're one way to know what's normal for your breasts. They can help you catch a problem while it's still small and can be treated. You need to: Check your breasts often. Tell your doctor about any changes. You should do breast self-exams even if you have breast implants. What you need: A mirror. A well-lit room. A pillow or other soft object. How to do a breast self-exam Look for changes  Take off all the clothes above your waist. Stand in front of a mirror in a room with good lighting. Put your hands down at your sides. Compare your breasts in the mirror. Look for difference between them, such as: Differences in shape. Differences in size. Wrinkles, dips, and bumps in one breast and not the other. Look at each breast for skin changes, such as: Redness. Scaly spots. Spots where your skin is thicker. Dimpling. Open sores. Look for changes in your nipples, such as: Fluid coming out of a nipple. Fluid around a  nipple. Bleeding. Dimpling. Redness. A nipple that looks pushed in or that has changed position. Feel for changes Lie on your back. Feel each breast. To do this: Pick a breast to feel. Place a pillow under the shoulder closest to that breast. Put the arm closest to that breast behind your head. Feel the breast using the hand of your other arm. Use the pads of your three middle fingers to make small circles starting near the nipple. Use light, medium, and firm pressure. Keep making circles, moving down over the breast. Stop when you feel your ribs. Start making circles with your fingers again, this time going up until you reach your collarbone. Then, make circles out across your breast and into your armpit area. Squeeze your nipple. Check for fluid and lumps. Do these steps again to check your other breast. Sit or stand in the tub or shower. With soapy water on your skin, feel each breast the same way you did when you were lying down. Write down what you find Writing down what you find can help you keep track of what you want to tell your doctor. Write down: What's normal for each breast. Any changes you find. Write down: The kind of change. If your breast feels tender or painful. Any lump you find. Write down its size and where it is. When you last had your  period. General tips If you're breastfeeding, the best time to check your breasts is after you feed your baby or after you use a breast pump. If you get a period, the best time to check your breasts is 5-7 days after your period ends. With time, you'll get more used to doing the self-exam. You'll also start to know if there are changes in your breasts. Contact a doctor if: You see a change in the shape or size of your breasts or nipples. You see a change in the skin of your breast or nipples. You have fluid coming from your nipples that isn't normal. You find a new lump or thick area. You have breast pain. You have any  concerns about your breast health. This information is not intended to replace advice given to you by your health care provider. Make sure you discuss any questions you have with your health care provider. Document Revised: 11/16/2023 Document Reviewed: 11/16/2023 Elsevier Patient Education  2025 [employer]  Document Revised: 03/04/2021 Document Reviewed: 03/04/2021 Elsevier Patient Education  2024 [employer].

## 2024-10-23 ENCOUNTER — Ambulatory Visit: Payer: Self-pay | Admitting: Certified Nurse Midwife

## 2024-10-23 ENCOUNTER — Telehealth: Payer: Self-pay

## 2024-10-23 ENCOUNTER — Encounter: Payer: Self-pay | Admitting: Certified Nurse Midwife

## 2024-10-23 VITALS — BP 131/87 | HR 91 | Ht 71.0 in | Wt 246.8 lb

## 2024-10-23 DIAGNOSIS — Z1329 Encounter for screening for other suspected endocrine disorder: Secondary | ICD-10-CM

## 2024-10-23 DIAGNOSIS — Z01419 Encounter for gynecological examination (general) (routine) without abnormal findings: Secondary | ICD-10-CM

## 2024-10-23 DIAGNOSIS — N912 Amenorrhea, unspecified: Secondary | ICD-10-CM

## 2024-10-23 DIAGNOSIS — Z124 Encounter for screening for malignant neoplasm of cervix: Secondary | ICD-10-CM

## 2024-10-23 DIAGNOSIS — Z131 Encounter for screening for diabetes mellitus: Secondary | ICD-10-CM

## 2024-10-23 DIAGNOSIS — Z113 Encounter for screening for infections with a predominantly sexual mode of transmission: Secondary | ICD-10-CM

## 2024-10-23 DIAGNOSIS — Z13220 Encounter for screening for lipoid disorders: Secondary | ICD-10-CM

## 2024-10-23 LAB — POCT URINE PREGNANCY: Preg Test, Ur: NEGATIVE

## 2024-10-23 MED ORDER — CLINDAMYCIN PHOS (ONCE-DAILY) 1 % EX GEL: 1.0000 "application " | Freq: Two times a day (BID) | CUTANEOUS | 3 refills | Status: AC

## 2024-10-23 MED ORDER — SLYND 4 MG PO TABS: 4.0000 mg | ORAL_TABLET | Freq: Every day | ORAL | 4 refills | Status: AC

## 2024-10-23 NOTE — Telephone Encounter (Signed)
 The patient had an appointment today. She was given labs to do at this visit and left without having them done. I contacted the patient x2 and left message for the patient to come back or scheduled an lab appointment only.

## 2024-10-25 LAB — CERVICOVAGINAL ANCILLARY ONLY
Bacterial Vaginitis (gardnerella): NEGATIVE
Candida Glabrata: NEGATIVE
Candida Vaginitis: NEGATIVE
Chlamydia: NEGATIVE
Comment: NEGATIVE
Comment: NEGATIVE
Comment: NEGATIVE
Comment: NEGATIVE
Comment: NEGATIVE
Comment: NORMAL
Neisseria Gonorrhea: NEGATIVE
Trichomonas: NEGATIVE
